# Patient Record
Sex: Male | Born: 1940
Health system: Southern US, Community
[De-identification: ages and names within clinical notes are randomized; demographics above are authoritative.]

## PROBLEM LIST (undated history)

## (undated) DIAGNOSIS — F32A Depression, unspecified: Secondary | ICD-10-CM

## (undated) DIAGNOSIS — F329 Major depressive disorder, single episode, unspecified: Secondary | ICD-10-CM

## (undated) DIAGNOSIS — I714 Abdominal aortic aneurysm, without rupture, unspecified: Secondary | ICD-10-CM

## (undated) DIAGNOSIS — F17200 Nicotine dependence, unspecified, uncomplicated: Secondary | ICD-10-CM

## (undated) DIAGNOSIS — I10 Essential (primary) hypertension: Secondary | ICD-10-CM

## (undated) DIAGNOSIS — C3490 Malignant neoplasm of unspecified part of unspecified bronchus or lung: Secondary | ICD-10-CM

## (undated) DIAGNOSIS — R198 Other specified symptoms and signs involving the digestive system and abdomen: Secondary | ICD-10-CM

## (undated) DIAGNOSIS — J449 Chronic obstructive pulmonary disease, unspecified: Secondary | ICD-10-CM

## (undated) DIAGNOSIS — M1990 Unspecified osteoarthritis, unspecified site: Secondary | ICD-10-CM

## (undated) DIAGNOSIS — K219 Gastro-esophageal reflux disease without esophagitis: Secondary | ICD-10-CM

## (undated) DIAGNOSIS — E079 Disorder of thyroid, unspecified: Secondary | ICD-10-CM

## (undated) DIAGNOSIS — G8929 Other chronic pain: Secondary | ICD-10-CM

## (undated) DIAGNOSIS — M549 Dorsalgia, unspecified: Secondary | ICD-10-CM

## (undated) DIAGNOSIS — Z8719 Personal history of other diseases of the digestive system: Secondary | ICD-10-CM

## (undated) DIAGNOSIS — I739 Peripheral vascular disease, unspecified: Secondary | ICD-10-CM

## (undated) DIAGNOSIS — E039 Hypothyroidism, unspecified: Secondary | ICD-10-CM

## (undated) HISTORY — DX: Abdominal aortic aneurysm, without rupture: I71.4

## (undated) HISTORY — DX: Essential (primary) hypertension: I10

## (undated) HISTORY — DX: Nicotine dependence, unspecified, uncomplicated: F17.200

## (undated) HISTORY — DX: Dorsalgia, unspecified: M54.9

## (undated) HISTORY — PX: APPENDECTOMY: SHX54

## (undated) HISTORY — DX: Malignant neoplasm of unspecified part of unspecified bronchus or lung: C34.90

## (undated) HISTORY — DX: Disorder of thyroid, unspecified: E07.9

## (undated) HISTORY — DX: Chronic obstructive pulmonary disease, unspecified: J44.9

## (undated) HISTORY — DX: Other chronic pain: G89.29

## (undated) HISTORY — DX: Abdominal aortic aneurysm, without rupture, unspecified: I71.40

---

## 1980-03-01 HISTORY — PX: VASECTOMY: SHX75

## 2005-07-30 ENCOUNTER — Ambulatory Visit: Payer: Self-pay | Admitting: Cardiology

## 2009-03-26 ENCOUNTER — Encounter: Admission: RE | Admit: 2009-03-26 | Discharge: 2009-03-26 | Payer: Self-pay | Admitting: Vascular Surgery

## 2009-03-26 ENCOUNTER — Ambulatory Visit: Payer: Self-pay | Admitting: Vascular Surgery

## 2009-03-27 ENCOUNTER — Ambulatory Visit: Payer: Self-pay | Admitting: Cardiovascular Disease

## 2009-03-27 DIAGNOSIS — I714 Abdominal aortic aneurysm, without rupture, unspecified: Secondary | ICD-10-CM | POA: Insufficient documentation

## 2009-03-27 DIAGNOSIS — R0602 Shortness of breath: Secondary | ICD-10-CM | POA: Insufficient documentation

## 2009-03-27 DIAGNOSIS — F17200 Nicotine dependence, unspecified, uncomplicated: Secondary | ICD-10-CM

## 2009-04-08 ENCOUNTER — Ambulatory Visit: Payer: Self-pay | Admitting: Cardiology

## 2009-04-08 ENCOUNTER — Encounter (HOSPITAL_COMMUNITY): Admission: RE | Admit: 2009-04-08 | Discharge: 2009-05-08 | Payer: Self-pay | Admitting: Cardiovascular Disease

## 2009-04-14 ENCOUNTER — Encounter: Payer: Self-pay | Admitting: Cardiovascular Disease

## 2009-04-22 ENCOUNTER — Ambulatory Visit: Payer: Self-pay | Admitting: Vascular Surgery

## 2009-04-22 ENCOUNTER — Inpatient Hospital Stay (HOSPITAL_COMMUNITY): Admission: RE | Admit: 2009-04-22 | Discharge: 2009-04-23 | Payer: Self-pay | Admitting: Vascular Surgery

## 2009-04-22 HISTORY — PX: ABDOMINAL AORTIC ANEURYSM REPAIR: SUR1152

## 2009-05-21 ENCOUNTER — Ambulatory Visit: Payer: Self-pay | Admitting: Vascular Surgery

## 2009-05-21 ENCOUNTER — Encounter: Admission: RE | Admit: 2009-05-21 | Discharge: 2009-05-21 | Payer: Self-pay | Admitting: Vascular Surgery

## 2009-11-20 ENCOUNTER — Ambulatory Visit: Payer: Self-pay | Admitting: Vascular Surgery

## 2009-11-20 ENCOUNTER — Encounter: Admission: RE | Admit: 2009-11-20 | Discharge: 2009-11-20 | Payer: Self-pay | Admitting: Vascular Surgery

## 2010-03-21 ENCOUNTER — Other Ambulatory Visit: Payer: Self-pay | Admitting: Vascular Surgery

## 2010-03-21 DIAGNOSIS — I714 Abdominal aortic aneurysm, without rupture, unspecified: Secondary | ICD-10-CM

## 2010-03-31 NOTE — Letter (Signed)
Summary: [facility]  [address], [place] [address]   Phone: [phone]  Fax: [phone]     April 14, 2009 MRN: [id number]   Terry Boyd [address] [place], [place]  [address]      YOUR LAB WORK IS DUE  April 29, 2009 _________________________________________  Please go to [facility], located across the street from [facility] on the second floor.  Hours are Monday - Friday 7am until 7:30pm         Saturday 8am until 12noon    __  DO NOT EAT OR DRINK AFTER MIDNIGHT EVENING PRIOR TO LABWORK  __ YOUR LABWORK IS NOT FASTING --YOU MAY EAT PRIOR TO LABWORK

## 2010-03-31 NOTE — Assessment & Plan Note (Signed)
Summary: PRO OP CLEARANCE FOR HERNIA REPAIR   Visit Type:  Initial Consult Primary Provider:  Julieanne Manson   History of Present Illness: Terry Boyd is seen today for pre-op clearance.  He needs a AAA repair by Dr Darrick Penna.  He indicates he may opt for stenting rather than an open procedure.  He stopped smoking a ppd 3 days ago.  He has no history of CAD, MI, or CHF.  He denies other CRF's besides the smoking.  He has mlld exertional dyspnea likely from his smoking.  I reviewed Dr Darrick Penna testing and it appears that his carotids are normal.  His ECG in our office today shows mildly poor R wave progression and is read by the computer as ? anteriro infarct age undetermined.  Given his abnormal ECG, smoking and need for vascular surgery we will recommend a stress myovue early next week.    Current Medications (verified): 1)  Lorazepam 1 Mg Tabs (Lorazepam) .... Take As Needed 2)  Levothyroxine Sodium 100 Mcg Tabs (Levothyroxine Sodium) .... Take 1 Tab Daily  Allergies (verified): No Known Drug Allergies  Past History:  Past Medical History: AAA Smoker Appendectomy Chronic Back Pain.    Family History: non-contributory  Social History: Divorced Retired Scientist, product/process development Smoker until 03/24/2009 Non-drinker Sedentary  Review of Systems       Denies fever, malais, weight loss, blurry vision, decreased visual acuity, cough, sputum, , hemoptysis, pleuritic pain, palpitaitons, heartburn, abdominal pain, melena, lower extremity edema, claudication, or rash. All other systems reviewed and negative  Vital Signs:  Patient profile:   70 year old male Height:      70 inches Weight:      179 pounds BMI:     25.78 Pulse rate:   75 / minute BP sitting:   135 / 86  (right arm)  Vitals Entered By: Dreama Saa, CNA (March 27, 2009 11:09 AM)  Physical Exam  General:  Affect appropriate Healthy:  appears stated age HEENT: normal Neck supple with no adenopathy JVP normal no bruits no  thyromegaly Lungs clear with no wheezing and good diaphragmatic motion Heart:  S1/S2 no murmur,rub, gallop or click PMI normal Abdomen: benighn, BS positve, no tenderness, no AAA no bruit.  No HSM or HJR Distal pulses intact with no bruits No edema Neuro non-focal Skin warm and dry Left popliteal prominant on palpation   Impression & Recommendations:  Problem # 1:  UNSPECIFIED PRE-OPERATIVE EXAMINATION (ICD-V72.84) Myovue to clear for vascular surgery given abnormal ECG and smoking Orders: Nuclear Stress Test (Nuc Stress Test)  Problem # 2:  CIGARETTE SMOKER (ICD-305.1) Counseled for less than 10 minutes benefits of cessation long term and especially pre-op.  Continue nicotine patch.  Problem # 3:  DYSPNEA (ICD-786.05) Pre-op CXR per Dr Darrick Penna.  Likely COPD.  No active wheezing  Patient Instructions: 1)  Your physician recommends that you schedule a follow-up appointment in: as needed  2)  Your physician has requested that you have an exercise stress myoview.  For further information please visit [email or web address]  Please follow instruction sheet, as given. 3)  Your physician recommends that you continue on your current medications as directed. Please refer to the Current Medication list given to you today.   EKG Report  Procedure date:  03/27/2009  Findings:      NSR 80 Cannot R/O anterior wall infarct Abnormal ECG

## 2010-03-31 NOTE — Letter (Signed)
Summary: Walker Treadmill (Nuc Med Stress)   [facility]  [address], [place] [address]   Phone: [phone]  Fax: [phone]    Nuclear Medicine 1-Day Stress Test Information Sheet  Re:     ANCIL DEWAN   DOB:     06/28/1940 MRN:     [id number] Weight:  Appointment Date: Register at: Appointment Time: Referring MD:  _X__Exercise Stress  __Adenosine   __Dobutamine  __Lexiscan  __Persantine   __Thallium  Urgency: ____1 (next day)   ____2 (one week)    ____3 (PRN)  Patient will receive Follow Up call with results: Patient needs follow-up appointment:  Instructions regarding medication:  How to prepare for your stress test: 1. DO NOT eat or dring 6 hours prior to your arrival time. This includes no caffeine (coffee, tea, sodas, chocolate) if you were instructed to take your medications, drink water with it. 2. DO NOT use any tobacco products for at leaset 8 hours prior to arrival. 3. DO NOT wear dresses or any clothing that may have metal clasps or buttons. 4. Wear short sleeve shirts, loose clothing, and comfortalbe walking shoes. 5. DO NOT use lotions, oils or powder on your chest before the test. 6. The test will take approximately 3-4 hours from the time you arrive until completion. 7. To register the day of the test, go to the Short Stay entrance at [facility]. 8. If you must cancel your test, call [phone] as soon as you are aware.  After you arrive for test:   When you arrive at [facility], you will go to Short Stay to be registered. They will then send you to Radiology to check in. The Nuclear Medicine Tech will get you and start an IV in your arm or hand. A small amount of a radioactive tracer will then be injected into your IV. This tracer will then have to circulate for 30-45 minutes. During this time you will wait in the waiting room and you will be able to drink something without caffeine. A series of pictures will be taken  of your heart follwoing this waiting period. After the 1st set of pictures you will go to the stress lab to get ready for your stress test. During the stress test, another small amount of a radioactive tracer will be injected through your IV. When the stress test is complete, there is a short rest period while your heart rate and blood pressure will be monitored. When this monitoring period is complete you will have another set of pictrues taken. (The same as the 1st set of pictures). These pictures are taken between 15 minutes and 1 hour after the stress test. The time depends on the type of stress test you had. Your doctor will inform you of your test results within 7 days after test.    The possibilities of certain changes are possible during the test. They include abnormal blood pressure and disorders of the heart. Side effects of persantine or adenosine can include flushing, chest pain, shortness of breath, stomach tightness, headache and light-headedness. These side effects usually do not last long and are self-resolving. Every effort will be made to keep you comfortable and to minimize complications by obtaining a medical history and by close observation during the test. Emergency equipment, medications, and trained personnel are available to deal with any unusual situation which may arise.  Please notify office at least 48 hours in advance if you are unable to keep  this appt.

## 2010-04-23 ENCOUNTER — Ambulatory Visit
Admission: RE | Admit: 2010-04-23 | Discharge: 2010-04-23 | Disposition: A | Discharge status: Home / Self Care | Payer: No Typology Code available for payment source | Source: Ambulatory Visit | Attending: Vascular Surgery | Admitting: Vascular Surgery

## 2010-04-23 ENCOUNTER — Other Ambulatory Visit: Payer: Self-pay

## 2010-04-23 ENCOUNTER — Ambulatory Visit (INDEPENDENT_AMBULATORY_CARE_PROVIDER_SITE_OTHER): Payer: No Typology Code available for payment source | Admitting: Vascular Surgery

## 2010-04-23 DIAGNOSIS — I714 Abdominal aortic aneurysm, without rupture, unspecified: Secondary | ICD-10-CM

## 2010-04-23 MED ORDER — IOHEXOL 350 MG/ML SOLN
100.0000 mL | Freq: Once | INTRAVENOUS | Status: AC | PRN
  Administered 2010-04-23: 100 mL via INTRAVENOUS

## 2010-04-24 NOTE — Assessment & Plan Note (Addendum)
OFFICE VISIT  Terry Boyd, Terry Boyd DOB:  31-Oct-1940                                       04/23/2010 EAVWU#:[id number]  The patient returns for followup today after aneurysm stent graft repair in February of 2011.  He denies any symptoms of abdominal pain or back pain.  CHRONIC MEDICAL PROBLEMS:  Remain diabetes, hypertension, elevated cholesterol, coronary artery disease, congestive heart failure, COPD. These are all currently stable and followed by Dr. Dimas Aguas.  REVIEW OF SYSTEMS:  He has some shortness of breath with exertion.  He denies chest pain.  PHYSICAL EXAM:  Vital signs:  Blood pressure is 138/84 in the left arm, heart rate is 79 and regular, oxygen saturation is 95% on room air. Chest:  Clear to auscultation.  Abdomen:  Soft, nontender, nondistended. No pulsatile mass.  He has 2+ femoral pulses bilaterally.  He had a CT scan of the abdomen and pelvis today which showed no evidence of endo leak.  His aneurysm diameter has shrunk from 5.9 cm preoperatively to 3.8 cm currently.  Overall the stent graft is in good position.  Of note, during his exam today he did have a cough and states that he has had cold type symptoms for about a week.  I gave him a prescription today for Tussionex for his cold symptoms and a 10 day supply was administered.  I told him to follow up with Dr. Dimas Aguas if his symptoms have not improved at the end of that 10 days.  As far as his aneurysm is concerned his stent graft looks good at this point.  He has had good shrinkage of the aneurysm over time.  He will follow up in 1 year's time with an aortic ultrasound to make sure that his stent graft is still in good position and has no evidence of endo leak.    [name]. Joeph Szatkowski, MD Electronically Signed  CEF/MEDQ  D:  04/23/2010  T:  04/24/2010  Job:  [id number]  cc:   Selinda Flavin

## 2010-05-21 LAB — BASIC METABOLIC PANEL
Calcium: 8.1 mg/dL — ABNORMAL LOW (ref 8.4–10.5)
Chloride: 108 mEq/L (ref 96–112)
Creatinine, Ser: 1.17 mg/dL (ref 0.4–1.5)
GFR calc non Af Amer: >60 mL/min (ref >60)
GFR calc non Af Amer: >60 mL/min (ref >60)

## 2010-05-21 LAB — CBC
HCT: 38.7 % — ABNORMAL LOW (ref 39.0–52.0)
HCT: 41.5 % (ref 39.0–52.0)
HCT: 49.3 % (ref 39.0–52.0)
Hemoglobin: 13.8 g/dL (ref 13.0–17.0)
Hemoglobin: 14.6 g/dL (ref 13.0–17.0)
MCHC: 35.3 g/dL (ref 30.0–36.0)
MCV: 94.3 fL (ref 78.0–100.0)
MCV: 94.7 fL (ref 78.0–100.0)
MCV: 95.4 fL (ref 78.0–100.0)
Platelets: 143 10*3/uL — ABNORMAL LOW (ref 150–400)
Platelets: 166 10*3/uL (ref 150–400)
RBC: 4.08 MIL/uL — ABNORMAL LOW (ref 4.22–5.81)
RBC: 4.4 MIL/uL (ref 4.22–5.81)
RDW: 13.4 % (ref 11.5–15.5)
RDW: 13.6 % (ref 11.5–15.5)
WBC: 10.8 10*3/uL — ABNORMAL HIGH (ref 4.0–10.5)
WBC: 7 10*3/uL (ref 4.0–10.5)

## 2010-05-21 LAB — COMPREHENSIVE METABOLIC PANEL
ALT: 20 U/L (ref 0–53)
AST: 18 U/L (ref 0–37)
BUN: 13 mg/dL (ref 6–23)
CO2: 21 mEq/L (ref 19–32)
Chloride: 107 mEq/L (ref 96–112)
Glucose, Bld: 110 mg/dL — ABNORMAL HIGH (ref 70–99)
Sodium: 135 mEq/L (ref 135–145)
Total Protein: 7.3 g/dL (ref 6.0–8.3)

## 2010-05-21 LAB — TYPE AND SCREEN
ABO/RH(D): A POS
Antibody Screen: NEGATIVE

## 2010-05-21 LAB — URINALYSIS, ROUTINE W REFLEX MICROSCOPIC
Bilirubin Urine: NEGATIVE
Hgb urine dipstick: NEGATIVE
Ketones, ur: NEGATIVE mg/dL
Protein, ur: NEGATIVE mg/dL
Urobilinogen, UA: 0.2 mg/dL (ref 0.0–1.0)
pH: 6 (ref 5.0–8.0)

## 2010-05-21 LAB — BLOOD GAS, ARTERIAL
Acid-base deficit: 1.1 mmol/L (ref 0.0–2.0)
FIO2: 0.21 %
TCO2: 24.1 mmol/L (ref 0–100)

## 2010-05-21 LAB — PROTIME-INR: INR: 1.02 (ref 0.00–1.49)

## 2010-05-21 LAB — APTT: aPTT: 31 seconds (ref 24–37)

## 2010-07-14 NOTE — Procedures (Signed)
CAROTID DUPLEX EXAM   INDICATION:  Preop evaluation for abdominal aortic aneurysm repair.   HISTORY:  Diabetes:  No.  Cardiac:  No.  Hypertension:  Yes.  Smoking:  Yes.  Previous Surgery:  No.  CV History:  Currently asymptomatic.  Amaurosis Fugax No, Paresthesias No, Hemiparesis No.                                       RIGHT             LEFT  Brachial systolic pressure:         118               124  Brachial Doppler waveforms:         Normal            Normal  Vertebral direction of flow:        Antegrade         Antegrade  DUPLEX VELOCITIES (cm/sec)  CCA peak systolic                   77                79  ECA peak systolic                   102               107  ICA peak systolic                   50                74  ICA end diastolic                   19                28  PLAQUE MORPHOLOGY:                  Heterogenous      Heterogenous  PLAQUE AMOUNT:                      Minimal           Minimal  PLAQUE LOCATION:                    Distal CCA        Bifurcation/ICA   IMPRESSION:  1. No evidence of stenosis noted in the right internal carotid artery.  2. 1-39% stenosis of the left internal carotid artery.        ___________________________________________  [name] Fields, MD   CH/MEDQ  D:  03/27/2009  T:  03/27/2009  Job:  [id number]

## 2010-07-14 NOTE — Assessment & Plan Note (Signed)
OFFICE VISIT   Terry Boyd, Terry DALE  DOB:  03-15-1940                                       03/26/2009  UEAVW#:[id number]   CHIEF COMPLAINT:  Abdominal aortic aneurysm.   HISTORY OF PRESENT ILLNESS:  The patient is a 70 year old male who 3  weeks ago was found have an abdominal aortic aneurysm on screening  ultrasound.  He presents today for evaluation of his abdominal aortic  aneurysm.  He denies any family history of aneurysm disease.  He denies  any recent abdominal or back pain.   CHRONIC MEDICAL PROBLEMS:  Mild COPD.   He denies any lower extremity claudication symptoms.   PAST MEDICAL HISTORY:  Is as mentioned above.   PAST SURGICAL HISTORY:  Appendectomy.   FAMILY HISTORY:  Unremarkable.   MEDICATIONS:  Lorazepam 1 mg p.r.n., levothyroxine 100 mcg tablet daily.   SOCIAL HISTORY:  He is single, has 2 children.  Currently smokes a pack  of cigarettes per day but is trying to quit.  He has been smoking for 50  years.  He does not consume alcohol regularly.   REVIEW OF SYSTEMS:  Full 12 point review of systems was performed with  the patient.  Please see intake referral form for details regarding  this.   PHYSICAL EXAMINATION:  Blood pressure 135/84, heart rate 87 and regular,  respirations 22,  oxygen saturation 97% in room air.  HEENT:  Unremarkable.  Neck:  Has 2+ carotid pulses without bruit.  Chest:  Clear to auscultation.  Cardiac:  Regular rate and rhythm without  murmur.  Abdomen:  Soft, nontender, nondistended with an easily palpable  pulsatile epigastric mass.  Extremities:  He has no significant edema.  He has 2+ femoral, 3+ popliteal and 2+ posterior tibial pulses  bilaterally.  He has absent left dorsalis pedis pulse.  He has a 2+  right dorsalis pedis pulse.  Musculoskeletal:  Exam shows no significant  major deformities.  Neurologic:  Exam shows symmetric upper extremity  and lower extremity motor strength which is 5/5.  Skin:   Has no ulcers  or rashes.  Lymphatics:  Exam shows no inguinal, axillary or cervical  adenopathy.   A CT scan of the abdomen and pelvis with IV contrast was performed today  at my request.  I reviewed and interpreted the images.  This is  remarkable for the right renal artery being the lowest.  The aortic  diameter is 21 mm at the neck and 23 cm 1 cm from this.  The aneurysm  diameter is 5.9 cm.  There is some calcium in the left external iliac  artery.  The common femoral arteries are 9 mm or greater in diameter.   In addition, since the patient did have a rather full-feeling popliteal  pulses, we obtained a popliteal ultrasound today to rule out aneurysm.  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  The right popliteal artery was less than 1 cm in  diameter.  There were triphasic waveforms in the popliteal artery  bilaterally with no thrombus.   Also, in light of the fact that we will be considering operative repair  of his aneurysm, he had a bilateral carotid duplex exam which showed  less than 40% stenosis of the left internal carotid  artery and no  stenosis of the right internal carotid artery.   In summary, the patient has a 5.9 cm abdominal aortic aneurysm.  I  believe he would benefit from repair for prevention of aneurysm rupture.  I discussed with him today the risks, benefits and possible  complications of aneurysm repair and I believe he would be a reasonable  candidate for endovascular stent graft repair.  In light of this, we  have forwarded his measurements to the [employer] to make sure that a  stent would be suitable for him.  We will schedule his aneurysm repair  in the near future.  I have also scheduled him to be evaluated by  [facility] in [place] to make sure from a cardiac perspective  his risk stratification is reasonable.  As soon as his cardiac workup is  complete, we will schedule him for  abdominal aortic aneurysm repair.  This is tentatively going to be scheduled for February 22 or 24.     [name]. Fields, MD  Electronically Signed   CEF/MEDQ  D:  04/02/2009  T:  04/03/2009  Job:  [id number]   cc:   Selinda Flavin  [facility] [place]

## 2010-07-14 NOTE — Procedures (Signed)
VASCULAR LAB EXAM   INDICATION:  Full popliteal pulses.   HISTORY:  Diabetes:  No.  Cardiac:  No.  Hypertension:  No.   EXAM:  Bilateral popliteal artery duplex.   IMPRESSION:  1. Patent right popliteal artery with a maximum diameter measurement      of less than 1.0 cm.  2. Patent left popliteal artery with aneurysmal dilatation noted at      the proximal-to-mid segment, measuring 1.4 X 1.5 cm.  3. Triphasic Doppler waveforms noted throughout the bilateral      popliteal arteries with minimal plaque formations noted.   ___________________________________________  [name] Fields, MD   CH/MEDQ  D:  03/27/2009  T:  03/27/2009  Job:  [id number]

## 2010-07-14 NOTE — Assessment & Plan Note (Signed)
OFFICE VISIT   LAMONTA, CYPRESS  DOB:  [birth year]                                       11/20/2009  ZOXWR#:[id number]   The patient returns for followup today.  He previously underwent repair  of an abdominal aortic aneurysm with a Gore Excluder stent graft on  February 22nd.  He has had no problems since the last time he was seen  in the office.  He has been traveling extensively and seeing many of his  [place] veteran buddies from the [employer]  Overall he feels well.  He  denies any abdominal or back pain.  He denies any claudication symptoms.   PHYSICAL EXAMINATION:  Today, blood pressure 149/87 in the left arm,  oxygen saturation is 98% on room air, heart rate 73 and regular.  Abdomen:  Soft, nontender, nondistended.  No masses.  He has 2+ femoral  and 2+ dorsalis pedis pulses bilaterally.  I reviewed his CT angiogram  of the abdomen and pelvis which was performed today.  His aneurysm  diameter has shrunk from 5.6 to 4.8 cm in diameter.  The stent graft is  flush at the level of the renal arteries.  The renal arteries fill well.  The stent graft is in good position.  Overall there is no evidence of  endoleak.   The patient continues to do well at this point.  We will see him again  in February 2012 for repeat CT angiogram.  If his aneurysm is stable at  that point, we will probably go to once yearly ultrasound.     [name]. Fields, MD  Electronically Signed   CEF/MEDQ  D:  11/20/2009  T:  11/21/2009  Job:  [id number]   cc:   Selinda Flavin

## 2010-07-14 NOTE — Assessment & Plan Note (Signed)
OFFICE VISIT   TASHON, CAPP  DOB:  18-Jun-1940                                       05/21/2009  [id number]#:[id number]   The patient is a 70 year old male who underwent aortic stent graft on  05/20/2009.  He returns today for further followup.  His operative  repair was uneventful.  He was discharged to home on postoperative day  #1.  Since arriving home, he has had no abdominal or back pain.  His  bowel function is normal.  He really has, at this point, returned to  normal activity.   On physical exam today, blood pressure is 135/88 in the left arm.  Heart  rate is 81 and regular.  Temperature is 97.9.  Abdomen is soft,  nontender, nondistended.  No pulsatile mass.  Extremities:  He has well-  healed groin incisions and 2+ femoral pulses bilaterally.   He had bilateral ABIs performed today which showed triphasic waveforms,  1.0 on the right and 1.10 on the left.   He had a CT scan of the abdomen and pelvis today, which was reviewed,  which shows the aneurysm is well excluded with no obvious evidence of  endoleak.   Overall patient is doing well.  He will return in 6 months' time for  repeat CT angiogram.  He will return sooner if he has any problems.     [name]. Fields, MD  Electronically Signed   CEF/MEDQ  D:  05/22/2009  T:  05/22/2009  Job:  [id number]   cc:   Selinda Flavin

## 2011-02-25 ENCOUNTER — Encounter: Payer: Self-pay | Admitting: Cardiology

## 2011-04-26 ENCOUNTER — Encounter (INDEPENDENT_AMBULATORY_CARE_PROVIDER_SITE_OTHER): Payer: Medicare Other | Admitting: Vascular Surgery

## 2011-04-26 DIAGNOSIS — Z48812 Encounter for surgical aftercare following surgery on the circulatory system: Secondary | ICD-10-CM

## 2011-04-26 DIAGNOSIS — I714 Abdominal aortic aneurysm, without rupture: Secondary | ICD-10-CM

## 2011-04-26 NOTE — Progress Notes (Unsigned)
AAA EVAR duplex performed @ [facility] 04/26/2011

## 2011-04-30 ENCOUNTER — Other Ambulatory Visit: Payer: Self-pay | Admitting: *Deleted

## 2011-04-30 ENCOUNTER — Encounter: Payer: Self-pay | Admitting: Vascular Surgery

## 2011-04-30 DIAGNOSIS — Z48812 Encounter for surgical aftercare following surgery on the circulatory system: Secondary | ICD-10-CM

## 2011-04-30 DIAGNOSIS — I724 Aneurysm of artery of lower extremity: Secondary | ICD-10-CM

## 2011-04-30 DIAGNOSIS — I714 Abdominal aortic aneurysm, without rupture: Secondary | ICD-10-CM

## 2011-04-30 NOTE — Procedures (Unsigned)
VASCULAR LAB EXAM  INDICATION:  Followup AAA endograft placed 04/22/2009  HISTORY: Diabetes:  No Cardiac:  No Hypertension:  No Smoking:  Currently  EXAM:  AAA Sac Size:  3.10 CM AP, 3.31 CM TRV  Previous Sac Size: CT 04/23/2010  3.3 CM AP, 3.8 CM TRV   IMPRESSION: 1. The aorta and endograft appear patent. 2. No significant change in size of the aneurysmal sac surrounding the     endograft. 3. No evidence of endoleak was detected.    ___________________________________________ [name]. Fields, MD  SH/MEDQ  D:  04/26/2011  T:  04/26/2011  Job:  [id number]

## 2011-05-05 ENCOUNTER — Encounter: Payer: Self-pay | Admitting: Vascular Surgery

## 2011-05-06 ENCOUNTER — Encounter (INDEPENDENT_AMBULATORY_CARE_PROVIDER_SITE_OTHER): Payer: Medicare Other | Admitting: *Deleted

## 2011-05-06 DIAGNOSIS — I724 Aneurysm of artery of lower extremity: Secondary | ICD-10-CM

## 2011-05-21 ENCOUNTER — Encounter: Payer: Self-pay | Admitting: Vascular Surgery

## 2011-05-21 ENCOUNTER — Other Ambulatory Visit: Payer: Self-pay | Admitting: *Deleted

## 2011-05-21 DIAGNOSIS — I724 Aneurysm of artery of lower extremity: Secondary | ICD-10-CM

## 2011-05-21 NOTE — Procedures (Unsigned)
VASCULAR LAB EXAM  INDICATION:  Popliteal aneurysm  HISTORY: Diabetes:  No Cardiac:  No Hypertension:  No Smoking:  Current  EXAM:  Left popliteal artery duplex  IMPRESSION:  Biphasic/triphasic Doppler waveforms noted throughout the left popliteal artery with maximum diameter measurement of 1.7 x 1.7 cm noted.  ___________________________________________ [name] Fields, MD  CH/MEDQ  D:  05/07/2011  T:  05/07/2011  Job:  [id number]

## 2011-12-23 ENCOUNTER — Other Ambulatory Visit: Payer: Self-pay

## 2011-12-23 DIAGNOSIS — I724 Aneurysm of artery of lower extremity: Secondary | ICD-10-CM

## 2011-12-23 DIAGNOSIS — I739 Peripheral vascular disease, unspecified: Secondary | ICD-10-CM

## 2012-01-12 ENCOUNTER — Encounter: Payer: Self-pay | Admitting: Vascular Surgery

## 2012-01-13 ENCOUNTER — Ambulatory Visit (INDEPENDENT_AMBULATORY_CARE_PROVIDER_SITE_OTHER): Payer: Medicare Other | Admitting: Vascular Surgery

## 2012-01-13 ENCOUNTER — Encounter: Payer: Self-pay | Admitting: Vascular Surgery

## 2012-01-13 ENCOUNTER — Encounter (INDEPENDENT_AMBULATORY_CARE_PROVIDER_SITE_OTHER): Payer: Medicare Other | Admitting: *Deleted

## 2012-01-13 VITALS — BP 152/82 | HR 67 | Resp 18 | Ht 69.5 in | Wt 184.0 lb

## 2012-01-13 DIAGNOSIS — I739 Peripheral vascular disease, unspecified: Secondary | ICD-10-CM

## 2012-01-13 DIAGNOSIS — Z48812 Encounter for surgical aftercare following surgery on the circulatory system: Secondary | ICD-10-CM

## 2012-01-13 DIAGNOSIS — I724 Aneurysm of artery of lower extremity: Secondary | ICD-10-CM

## 2012-01-13 NOTE — Addendum Note (Signed)
Addended by: Sharee Pimple on: 01/13/2012 02:08 PM   Modules accepted: Orders

## 2012-01-13 NOTE — Progress Notes (Signed)
[facility] [place] HISTORY AND PHYSICAL   History of Present Illness:  Patient is a 71 y.o. year old male who presents for evaluation of bilateral lower extremity pain.  He previously had Gore Excluder aneurysm stent graft repair in 2011. Over the past several months he has complained of burning and numbness tingling in both legs extending from the knee to the foot. Both legs are equal. This is not activity related. It can occur at rest or with activity. He denies rest pain. 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 He was sent for further evaluation by Dr. Dimas Aguas based on an ABI from inside imaging which showed an ABI of 0.6 on the left and 1.0 on the right. Other medical problems include tobacco abuse, chronic back pain, hypertension, thyroid disease, COPD. These are all currently stable. He is currently taking Ativan which he says helps his leg pain. He does have a known history of a popliteal aneurysm that we have been following.  Past Medical History  Diagnosis Date  . AAA (abdominal aortic aneurysm)   . Smoker   . Chronic back pain   . Hypertension   . Thyroid disease   . COPD (chronic obstructive pulmonary disease)     Past Surgical History  Procedure Date  . Appendectomy   . Abdominal aortic aneurysm repair 04-22-2009    Stent graft repair of AAA     Social History History  Substance Use Topics  . Smoking status: Current Some Day Smoker -- 57 years    Types: Cigars  . Smokeless tobacco: Not on file  . Alcohol Use: No    Family History Family History  Problem Relation Age of Onset  . Diabetes Mother   . Heart disease Father   . Diabetes Father   . Heart disease Sister     CABG x 4  . Cancer Brother     throat cancer    Allergies  Allergies  Allergen Reactions  . Morphine And Related     hallucinations     Current Outpatient Prescriptions  Medication Sig Dispense Refill  . levothyroxine (SYNTHROID, LEVOTHROID) 100 MCG tablet  Take 100 mcg by mouth daily.        Marland Kitchen LORazepam (ATIVAN) 1 MG tablet Take 1 mg by mouth as needed.        . sertraline (ZOLOFT) 50 MG tablet Take 50 mg by mouth daily.      Marland Kitchen lisinopril (PRINIVIL,ZESTRIL) 10 MG tablet Take 10 mg by mouth daily.          ROS:   General:  No weight loss, Fever, chills  HEENT: No recent headaches, no nasal bleeding, no visual changes, no sore throat  Neurologic: No dizziness, blackouts, seizures. No recent symptoms of stroke or mini- stroke. No recent episodes of slurred speech, or temporary blindness.  Cardiac: No recent episodes of chest pain/pressure, no shortness of breath at rest.  No shortness of breath with exertion.  Denies history of atrial fibrillation or irregular heartbeat  Vascular: No history of rest pain in feet.  No history of claudication.  No history of non-healing ulcer, No history of DVT   Pulmonary: No home oxygen, no productive cough, no hemoptysis,  No asthma or wheezing  Musculoskeletal:  [ ]  Arthritis, [x ] Low back pain,  [ ]  Joint pain  Hematologic:No history of hypercoagulable state.  No history of easy bleeding.  No history of anemia  Gastrointestinal: No hematochezia or melena,  No gastroesophageal reflux, no trouble swallowing  Urinary: [ ]  chronic Kidney disease, [ ]  on HD - [ ]  MWF or [ ]  TTHS, [ ]  Burning with urination, [ ]  Frequent urination, [ ]  Difficulty urinating;   Skin: No rashes  Psychological: No history of anxiety,  No history of depression   Physical Examination  Filed Vitals:   01/13/12 1318  BP: 152/82  Pulse: 67  Resp: 18  Height: 5' 9.5" (1.765 m)  Weight: 184 lb (83.462 kg)  SpO2: 99%    Body mass index is 26.78 kg/(m^2).  General:  Alert and oriented, no acute distress HEENT: Normal Neck: No bruit or JVD Pulmonary: Clear to auscultation bilaterally Cardiac: Regular Rate and Rhythm without murmur Abdomen: Soft, non-tender, non-distended, no mass, no scars Skin: No rash Extremity  Pulses:  2+ radial, brachial, femoral, dorsalis pedis, posterior tibial pulses bilaterally, popliteal pulses are full bilaterally Musculoskeletal: No deformity or edema  Neurologic: Upper and lower extremity motor 5/5 and symmetric  DATA: He had bilateral ABIs performed in our office today which were actually normal and greater than 1 bilaterally. He also had digit pressures which were 74 on the left 82 on the right which were also within normal limits. Waveforms were triphasic bilaterally which were within normal limits. However, duplex of his left popliteal artery aneurysm shows his aneurysm has grown somewhat. It is now 1.95 x 1.99 cm. There was moderate intramural thrombus. The left popliteal aneurysm was 1.5 cm in diameter in 2011. He had no evidence of a right popliteal aneurysm in 2011.   ASSESSMENT: Bilateral lower extremity pain which sounds more like neuropathy and claudication. The patient had normal ABIs on our vascular lab exam today. He does have a popliteal aneurysm which is at a size we should consider repair of this. I discussed with the patient today the risk of distal embolization from an occluded popliteal aneurysm. He states that he has to make arrangements to have people cover his fall arm and take care of his brother and wishes to defer repair for now. He has followup scheduled for February 2014 for recheck of his aneurysm stent graft. I will see him again at that time and discuss again whether or not to repair his left popliteal aneurysm. I discussed with him the possibility of stent graft repair of this versus open repair. Both of these may be an option however I did discuss that stent graft repair may not be as durable for the popliteal aneurysm.   PLAN:  Followup CT Angio abdomen pelvis to reevaluate his aneurysm stent graft in February 2014. We will also address at that time whether he wishes to have a left popliteal aneurysm repair.  Fabienne Bruns, MD [facility] [place] Office: [phone] Pager: [phone]

## 2012-01-31 ENCOUNTER — Encounter: Payer: Self-pay | Admitting: *Deleted

## 2012-02-02 ENCOUNTER — Ambulatory Visit (INDEPENDENT_AMBULATORY_CARE_PROVIDER_SITE_OTHER): Payer: Medicare Other | Admitting: Cardiovascular Disease

## 2012-02-02 ENCOUNTER — Ambulatory Visit (HOSPITAL_COMMUNITY)
Admission: RE | Admit: 2012-02-02 | Discharge: 2012-02-02 | Disposition: A | Discharge status: Home / Self Care | Payer: Medicare Other | Source: Ambulatory Visit | Attending: Cardiovascular Disease | Admitting: Cardiovascular Disease

## 2012-02-02 ENCOUNTER — Encounter: Payer: Medicare Other | Admitting: Cardiovascular Disease

## 2012-02-02 ENCOUNTER — Encounter: Payer: Self-pay | Admitting: Cardiovascular Disease

## 2012-02-02 VITALS — BP 142/100 | HR 78 | Ht 70.0 in | Wt 185.0 lb

## 2012-02-02 DIAGNOSIS — I079 Rheumatic tricuspid valve disease, unspecified: Secondary | ICD-10-CM | POA: Insufficient documentation

## 2012-02-02 DIAGNOSIS — F17200 Nicotine dependence, unspecified, uncomplicated: Secondary | ICD-10-CM | POA: Insufficient documentation

## 2012-02-02 DIAGNOSIS — R0609 Other forms of dyspnea: Secondary | ICD-10-CM | POA: Insufficient documentation

## 2012-02-02 DIAGNOSIS — Z01818 Encounter for other preprocedural examination: Secondary | ICD-10-CM | POA: Insufficient documentation

## 2012-02-02 DIAGNOSIS — R0989 Other specified symptoms and signs involving the circulatory and respiratory systems: Secondary | ICD-10-CM

## 2012-02-02 DIAGNOSIS — R0602 Shortness of breath: Secondary | ICD-10-CM

## 2012-02-02 DIAGNOSIS — R0600 Dyspnea, unspecified: Secondary | ICD-10-CM

## 2012-02-02 DIAGNOSIS — Z01810 Encounter for preprocedural cardiovascular examination: Secondary | ICD-10-CM

## 2012-02-02 DIAGNOSIS — J4489 Other specified chronic obstructive pulmonary disease: Secondary | ICD-10-CM | POA: Insufficient documentation

## 2012-02-02 DIAGNOSIS — J449 Chronic obstructive pulmonary disease, unspecified: Secondary | ICD-10-CM

## 2012-02-02 DIAGNOSIS — I739 Peripheral vascular disease, unspecified: Secondary | ICD-10-CM | POA: Insufficient documentation

## 2012-02-02 NOTE — Patient Instructions (Addendum)
Your physician recommends that you schedule a follow-up appointment in: As needed  Your physician has requested that you have an echocardiogram. Echocardiography is a painless test that uses sound waves to create images of your heart. It provides your doctor with information about the size and shape of your heart and how well your heart's chambers and valves are working. This procedure takes approximately one hour. There are no restrictions for this procedure.  A chest x-ray takes a picture of the organs and structures inside the chest, including the heart, lungs, and blood vessels. This test can show several things, including, whether the heart is enlarges; whether fluid is building up in the lungs; and whether pacemaker / defibrillator leads are still in place.

## 2012-02-02 NOTE — Progress Notes (Signed)
*  PRELIMINARY RESULTS* Echocardiogram 2D Echocardiogram has been performed.  Conrad Dora 02/02/2012, 1:08 PM

## 2012-02-02 NOTE — Assessment & Plan Note (Signed)
Clear to have surgery .  No complications with AAA surgery and this surgery is non-abdominal.  No chest pain normal ECG and low risk myovue 2011.

## 2012-02-02 NOTE — Assessment & Plan Note (Addendum)
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

## 2012-02-02 NOTE — Progress Notes (Signed)
Patient ID: Terry Boyd, male   DOB: [birth year], 71 y.o.   MRN: [id number] 71 yo referred by Dr Darrick Penna for preop clearance.  Smoker with vascular disease.  Seen 2 years ago for clearance to have AAA repair.  Had stent graft with no complications.  Myovue with soft tissue artifact low risk  EF 45%.  Not motivated to quit smoking 1PPD  Clinical COPD but does not use inhalers regularly Had flu shot.  No chest pain. Mild Exertional dyspnea.  No palpitations edema or syncope.  No previous anesthesia issues or bleeding diathesis.  ECG is normal.  Recent ABI's .8/1 with more neuropathy than claudication.  Left popliteal aneurysm has been followed for a while. Has not had CXR in over a year  ROS: Denies fever, malais, weight loss, blurry vision, decreased visual acuity, cough, sputum, SOB, hemoptysis, pleuritic pain, palpitaitons, heartburn, abdominal pain, melena, lower extremity edema, claudication, or rash.  All other systems reviewed and negative   General: Affect appropriate Chronically ill male HEENT: normal Neck supple with no adenopathy JVP normal no bruits no thyromegaly Lungs clear with end expitory  wheezing and good diaphragmatic motion Heart:  S1/S2 no murmur,rub, gallop or click PMI normal Abdomen: benighn, BS positve, no tenderness, no AAA no bruit.  No HSM or HJR Distal pulses intact with pulsatile aneurysm left popliteal No edema Neuro non-focal Skin warm and dry No muscular weakness  Medications Current Outpatient Prescriptions  Medication Sig Dispense Refill  . levothyroxine (SYNTHROID, LEVOTHROID) 100 MCG tablet Take 100 mcg by mouth daily.        Marland Kitchen LORazepam (ATIVAN) 1 MG tablet Take 1 mg by mouth as needed.        . sertraline (ZOLOFT) 50 MG tablet Take 50 mg by mouth daily.      Marland Kitchen lisinopril (PRINIVIL,ZESTRIL) 10 MG tablet Take 10 mg by mouth daily.          Allergies Morphine and related  Family History: Family History  Problem Relation Age of Onset  .  Diabetes Mother   . Heart disease Father   . Diabetes Father   . Heart disease Sister     CABG x 4  . Cancer Brother     throat cancer    Social History: History   Social History  . Marital Status: Divorced    Spouse Name: N/A    Number of Children: N/A  . Years of Education: N/A   Occupational History  . Retired Scientist, product/process development    Social History Main Topics  . Smoking status: Current Some Day Smoker -- 57 years    Types: Cigars  . Smokeless tobacco: Not on file  . Alcohol Use: No  . Drug Use: No  . Sexually Active: Not on file   Other Topics Concern  . Not on file   Social History Narrative   Sedentary    Electrocardiogram:  NSR rate 75  normal  Assessment and Plan

## 2012-02-03 NOTE — Progress Notes (Signed)
Patient ID: Terry Boyd, male   DOB: 20-Sep-1940, 71 y.o.   MRN: [id number]

## 2012-02-04 ENCOUNTER — Telehealth: Payer: Self-pay | Admitting: *Deleted

## 2012-02-04 DIAGNOSIS — Z01818 Encounter for other preprocedural examination: Secondary | ICD-10-CM

## 2012-02-04 NOTE — Telephone Encounter (Signed)
Spoke to pt to advise results/instructions. Pt understood for echo Advised he will need a lexiscan myoview in order to be cleared for surgery and that someone will call him with that apt date/time Placed order in chart for testing to be scheduled, alerted scheduler TS verbally to have this pt scheduled for testing

## 2012-02-11 ENCOUNTER — Encounter (HOSPITAL_COMMUNITY): Payer: Self-pay

## 2012-02-11 ENCOUNTER — Encounter (HOSPITAL_COMMUNITY)
Admission: RE | Admit: 2012-02-11 | Discharge: 2012-02-11 | Disposition: A | Discharge status: Home / Self Care | Payer: Medicare Other | Source: Ambulatory Visit | Attending: Cardiovascular Disease | Admitting: Cardiovascular Disease

## 2012-02-11 ENCOUNTER — Ambulatory Visit (HOSPITAL_COMMUNITY)
Admission: RE | Admit: 2012-02-11 | Discharge: 2012-02-11 | Disposition: A | Discharge status: Home / Self Care | Payer: Medicare Other | Source: Ambulatory Visit | Attending: Cardiovascular Disease | Admitting: Cardiovascular Disease

## 2012-02-11 ENCOUNTER — Encounter (HOSPITAL_COMMUNITY): Payer: Self-pay | Admitting: Cardiology

## 2012-02-11 DIAGNOSIS — I739 Peripheral vascular disease, unspecified: Secondary | ICD-10-CM

## 2012-02-11 DIAGNOSIS — I724 Aneurysm of artery of lower extremity: Secondary | ICD-10-CM | POA: Insufficient documentation

## 2012-02-11 DIAGNOSIS — Z01810 Encounter for preprocedural cardiovascular examination: Secondary | ICD-10-CM | POA: Insufficient documentation

## 2012-02-11 DIAGNOSIS — R9439 Abnormal result of other cardiovascular function study: Secondary | ICD-10-CM | POA: Insufficient documentation

## 2012-02-11 DIAGNOSIS — R0602 Shortness of breath: Secondary | ICD-10-CM | POA: Insufficient documentation

## 2012-02-11 DIAGNOSIS — Z01818 Encounter for other preprocedural examination: Secondary | ICD-10-CM

## 2012-02-11 DIAGNOSIS — F17200 Nicotine dependence, unspecified, uncomplicated: Secondary | ICD-10-CM | POA: Insufficient documentation

## 2012-02-11 DIAGNOSIS — I714 Abdominal aortic aneurysm, without rupture, unspecified: Secondary | ICD-10-CM | POA: Insufficient documentation

## 2012-02-11 MED ORDER — REGADENOSON 0.4 MG/5ML IV SOLN
INTRAVENOUS | Status: AC
  Administered 2012-02-11: 0.4 mg via INTRAVENOUS
  Filled 2012-02-11: qty 5

## 2012-02-11 MED ORDER — TECHNETIUM TC 99M SESTAMIBI - CARDIOLITE
30.0000 | Freq: Once | INTRAVENOUS | Status: AC | PRN
  Administered 2012-02-11: 29 via INTRAVENOUS

## 2012-02-11 MED ORDER — SODIUM CHLORIDE 0.9 % IJ SOLN
INTRAMUSCULAR | Status: AC
  Administered 2012-02-11: 10 mL via INTRAVENOUS
  Filled 2012-02-11: qty 10

## 2012-02-11 MED ORDER — TECHNETIUM TC 99M SESTAMIBI - CARDIOLITE
10.0000 | Freq: Once | INTRAVENOUS | Status: AC | PRN
  Administered 2012-02-11: 10 via INTRAVENOUS

## 2012-02-11 NOTE — Progress Notes (Signed)
Stress Lab Nurses Notes - [facility]  JEREN DUFRANE 02/11/2012 Reason for doing test: Dyspnea and Surgical Clearance Type of test: Marlane Hatcher Nurse performing test: Parke Poisson, RN Nuclear Medicine Tech: Lou Cal Echo Tech: Not Applicable MD performing test: R. Rothbart & Joni Reining NP Family MD: Dimas Aguas Test explained and consent signed: yes IV started: 22g jelco, Saline lock flushed, No redness or edema and Saline lock started in radiology Symptoms: Couphing Treatment/Intervention: None Reason test stopped: protocol completed After recovery IV was: Discontinued via X-ray tech and No redness or edema Patient to return to Nuc. Med at : 11:45 Patient discharged: Home Patient's Condition upon discharge was: stable Comments: During test BP 100/79 & HR 97. Recovery BP 106/73 & HR  75.  Symptoms resolved in recovery.    Erskine Speed T

## 2012-02-15 ENCOUNTER — Telehealth: Payer: Self-pay | Admitting: Vascular Surgery

## 2012-02-15 NOTE — Telephone Encounter (Addendum)
Message copied by Shari Prows on Tue Feb 15, 2012  2:45 PM ------      Message from: Sherren Kerns      Created: Tue Feb 15, 2012  2:24 PM      Regarding: RE: appt scheduling       Move the visit up that was before he changed his mind on an operation.  If this Thursday is jam packed he can be seen first office I have in January.  Please look to see if I am on call Jan 2 if so the office will need to wrap up early like we do for Dr Imogene Burn when he is on call on a weekend            Charles.      ----- Message -----         From: Shari Prows         Sent: 02/15/2012   1:29 PM           To: Melene Plan, RN, Sherren Kerns, MD      Subject: appt scheduling                                          In regards to the message below, this patient has an appt to see CEF on 04/27/12 with a cta prior. Should I move this appt to early January? Or leave it as is. Let me know and I will contact the pt.       Thanks, Annette                    PLEASE SET UP OFFICE VISIT BECAUSE HE WAS SEEN LAST 01/13/12.         THANKS         JUDY         ----- Message -----            From: Sherren Kerns, MD            Sent: 02/15/2012   9:14 AM              To: Melene Plan, RN                   Ok to schedule for popliteal aneurysm repair.  He needs to see me in the office if it has been more than 1 month             I rescheduled the above pt to 03/02/12 to see CEF at 8:45am and to have his cta scan at 7:45am. I spoke to the pt and also mailed him instructions for these appts and to have bloodwork prior. He voiced understanding. awt

## 2012-02-29 ENCOUNTER — Encounter: Payer: Self-pay | Admitting: Vascular Surgery

## 2012-03-02 ENCOUNTER — Ambulatory Visit
Admission: RE | Admit: 2012-03-02 | Discharge: 2012-03-02 | Disposition: A | Discharge status: Home / Self Care | Payer: Medicare Other | Source: Ambulatory Visit | Attending: Vascular Surgery | Admitting: Vascular Surgery

## 2012-03-02 ENCOUNTER — Ambulatory Visit (INDEPENDENT_AMBULATORY_CARE_PROVIDER_SITE_OTHER): Payer: Medicare Other | Admitting: Vascular Surgery

## 2012-03-02 ENCOUNTER — Encounter: Payer: Self-pay | Admitting: Vascular Surgery

## 2012-03-02 VITALS — BP 151/73 | HR 91 | Ht 69.5 in | Wt 188.9 lb

## 2012-03-02 DIAGNOSIS — I724 Aneurysm of artery of lower extremity: Secondary | ICD-10-CM

## 2012-03-02 DIAGNOSIS — Z48812 Encounter for surgical aftercare following surgery on the circulatory system: Secondary | ICD-10-CM

## 2012-03-02 DIAGNOSIS — I739 Peripheral vascular disease, unspecified: Secondary | ICD-10-CM

## 2012-03-02 MED ORDER — IOHEXOL 350 MG/ML SOLN
150.0000 mL | Freq: Once | INTRAVENOUS | Status: AC | PRN
  Administered 2012-03-02: 150 mL via INTRAVENOUS

## 2012-03-02 NOTE — Progress Notes (Signed)
[facility] [place]  HISTORY AND PHYSICAL  History of Present Illness: Patient is a 71 y.o. year old male who presents for consideration of repair of a left popliteal aneurysm. 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 He does have a history of bilateral lower extremity pain. He previously had Gore Excluder aneurysm stent graft repair in 2011. Over the past several months he has complained of burning and numbness tingling in both legs extending from the knee to the foot. Both legs are equal. This is not activity related. It can occur at rest or with activity. He denies rest pain. 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 He was sent for further evaluation by Dr. Howard based on an ABI from inside imaging which showed an ABI of 0.6 on the left and 1.0 on the right. Other medical problems include tobacco abuse, chronic back pain, hypertension, thyroid disease, COPD. These are all currently stable. He is currently taking Ativan which he says helps his leg pain. He does have a known history of a popliteal aneurysm that we have been following.  Past Medical History   Diagnosis  Date   .  AAA (abdominal aortic aneurysm)    .  Smoker    .  Chronic back pain    .  Hypertension    .  Thyroid disease    .  COPD (chronic obstructive pulmonary disease)     Past Surgical History   Procedure  Date   .  Appendectomy    .  Abdominal aortic aneurysm repair  04-22-2009     Stent graft repair of AAA   Social History  History   Substance Use Topics   .  Smoking status:  Current Some Day Smoker -- 57 years     Types:  Cigars   .  Smokeless tobacco:  Not on file   .  Alcohol Use:  No   Family History  Family History   Problem  Relation  Age of Onset   .  Diabetes  Mother    .  Heart disease  Father    .  Diabetes  Father    .  Heart disease  Sister       CABG x 4    .  Cancer  Brother       throat cancer   Allergies    Allergies   Allergen  Reactions   .  Morphine And Related      hallucinations    Current Outpatient Prescriptions   Medication  Sig  Dispense  Refill   .  levothyroxine (SYNTHROID, LEVOTHROID) 100 MCG tablet  Take 100 mcg by mouth daily.     .  LORazepam (ATIVAN) 1 MG tablet  Take 1 mg by mouth as needed.     .  sertraline (ZOLOFT) 50 MG tablet  Take 50 mg by mouth daily.     .  lisinopril (PRINIVIL,ZESTRIL) 10 MG tablet  Take 10 mg by mouth daily.     ROS:  General: No weight loss, Fever, chills  HEENT: No recent headaches, no nasal bleeding, no visual changes, no sore throat  Neurologic: No dizziness, blackouts, seizures. No recent symptoms of stroke or mini- stroke. No recent episodes of slurred speech, or temporary blindness.  Cardiac: No recent episodes of chest pain/pressure, no shortness of breath at rest. No shortness of breath with exertion. Denies   history of atrial fibrillation or irregular heartbeat  Vascular: No history of rest pain in feet. No history of claudication. No history of non-healing ulcer, No history of DVT  Pulmonary: No home oxygen, no productive cough, no hemoptysis, No asthma or wheezing  Musculoskeletal: [ ] Arthritis, [x ] Low back pain, [ ] Joint pain  Hematologic:No history of hypercoagulable state. No history of easy bleeding. No history of anemia  Gastrointestinal: No hematochezia or melena, No gastroesophageal reflux, no trouble swallowing  Urinary: [ ] chronic Kidney disease, [ ] on HD - [ ] MWF or [ ] TTHS, [ ] Burning with urination, [ ] Frequent urination, [ ] Difficulty urinating;  Skin: No rashes  Psychological: No history of anxiety, No history of depression  Physical Examination  Filed Vitals:   03/02/12 0920  BP: 151/73  Pulse: 91  Height: 5' 9.5" (1.765 m)  Weight: 188 lb 14.4 oz (85.684 kg)  SpO2: 96%   General: Alert and oriented, no acute distress  HEENT: Normal  Neck: No bruit or JVD  Pulmonary: Clear to auscultation bilaterally   Cardiac: Regular Rate and Rhythm without murmur  Abdomen: Soft, non-tender, non-distended, no mass, no scars  Skin: No rash  Extremity Pulses: 2+ radial, brachial, femoral, absent dorsalis pedis, posterior tibial pulses bilaterally, popliteal pulses are full bilaterally  Musculoskeletal: No deformity or edema  Neurologic: Upper and lower extremity motor 5/5 and symmetric   DATA: He had bilateral ABIs performed recently which were actually normal and greater than 1 bilaterally. He also had digit pressures which were 74 on the left 82 on the right which were also within normal limits. Waveforms were triphasic bilaterally which were within normal limits. However, duplex of his left popliteal artery aneurysm shows his aneurysm has grown somewhat. It is now 1.95 x 1.99 cm. There was moderate intramural thrombus. The left popliteal aneurysm was 1.5 cm in diameter in 2011. He had no evidence of a right popliteal aneurysm in 2011.   ASSESSMENT: Bilateral lower extremity pain which sounds more like neuropathy and claudication. The patient had normal ABIs. He does have a popliteal aneurysm which is at a size we should consider repair of this. I discussed with the patient today the risk of distal embolization from an occluded popliteal aneurysm. At this point he wishes repair of a left popliteal aneurysm.  Recent cardiac risk stratification by Dr. Rothbart and Nishan shows reasonable operative risk  PLAN:  Bilateral lower extremity arteriogram January 17. Repair of left popliteal aneurysm January 20. Risks benefits possible complications procedure details of the arteriogram in the popliteal aneurysm repair were discussed with the patient today. He understands and agrees to proceed.  Charles Fields, MD  [facility]  Office: [phone]  Pager: [phone]  

## 2012-03-03 ENCOUNTER — Encounter: Payer: Self-pay | Admitting: *Deleted

## 2012-03-03 ENCOUNTER — Other Ambulatory Visit: Payer: Self-pay | Admitting: *Deleted

## 2012-03-06 ENCOUNTER — Encounter (HOSPITAL_COMMUNITY): Payer: Self-pay | Admitting: Pharmacy Technician

## 2012-03-10 ENCOUNTER — Other Ambulatory Visit: Payer: Self-pay

## 2012-03-13 MED ORDER — DEXTROSE 5 % IV SOLN
1.5000 g | INTRAVENOUS | Status: DC
  Filled 2012-03-13: qty 1.5

## 2012-03-14 ENCOUNTER — Encounter (HOSPITAL_COMMUNITY)
Admission: RE | Disposition: A | Discharge status: Home / Self Care | Payer: Self-pay | Source: Ambulatory Visit | Attending: Vascular Surgery

## 2012-03-14 ENCOUNTER — Ambulatory Visit (HOSPITAL_COMMUNITY)
Admission: RE | Admit: 2012-03-14 | Discharge: 2012-03-14 | Disposition: A | Discharge status: Home / Self Care | Payer: Medicare Other | Source: Ambulatory Visit | Attending: Vascular Surgery | Admitting: Vascular Surgery

## 2012-03-14 DIAGNOSIS — M549 Dorsalgia, unspecified: Secondary | ICD-10-CM | POA: Insufficient documentation

## 2012-03-14 DIAGNOSIS — I724 Aneurysm of artery of lower extremity: Secondary | ICD-10-CM | POA: Insufficient documentation

## 2012-03-14 DIAGNOSIS — G8929 Other chronic pain: Secondary | ICD-10-CM | POA: Insufficient documentation

## 2012-03-14 DIAGNOSIS — J449 Chronic obstructive pulmonary disease, unspecified: Secondary | ICD-10-CM | POA: Insufficient documentation

## 2012-03-14 DIAGNOSIS — J4489 Other specified chronic obstructive pulmonary disease: Secondary | ICD-10-CM | POA: Insufficient documentation

## 2012-03-14 DIAGNOSIS — I10 Essential (primary) hypertension: Secondary | ICD-10-CM | POA: Insufficient documentation

## 2012-03-14 DIAGNOSIS — F17200 Nicotine dependence, unspecified, uncomplicated: Secondary | ICD-10-CM | POA: Insufficient documentation

## 2012-03-14 HISTORY — PX: ABDOMINAL AORTAGRAM: SHX5454

## 2012-03-14 LAB — POCT I-STAT, CHEM 8
BUN: 19 mg/dL (ref 6–23)
Calcium, Ion: 1.25 mmol/L (ref 1.13–1.30)
HCT: 52 % (ref 39.0–52.0)
Hemoglobin: 17.7 g/dL — ABNORMAL HIGH (ref 13.0–17.0)
TCO2: 24 mmol/L (ref 0–100)

## 2012-03-14 SURGERY — ABDOMINAL AORTAGRAM
Anesthesia: LOCAL

## 2012-03-14 MED ORDER — LIDOCAINE HCL (PF) 1 % IJ SOLN
INTRAMUSCULAR | Status: AC
  Filled 2012-03-14: qty 30

## 2012-03-14 MED ORDER — ONDANSETRON HCL 4 MG/2ML IJ SOLN: 4.0000 mg | Freq: Four times a day (QID) | INTRAMUSCULAR | Status: DC | PRN

## 2012-03-14 MED ORDER — METOPROLOL TARTRATE 1 MG/ML IV SOLN: 2.0000 mg | INTRAVENOUS | Status: DC | PRN

## 2012-03-14 MED ORDER — ALUM & MAG HYDROXIDE-SIMETH 200-200-20 MG/5ML PO SUSP: 15.0000 mL | ORAL | Status: DC | PRN

## 2012-03-14 MED ORDER — HYDRALAZINE HCL 20 MG/ML IJ SOLN: 10.0000 mg | INTRAMUSCULAR | Status: DC | PRN

## 2012-03-14 MED ORDER — LABETALOL HCL 5 MG/ML IV SOLN: 10.0000 mg | INTRAVENOUS | Status: DC | PRN

## 2012-03-14 MED ORDER — ACETAMINOPHEN 325 MG RE SUPP: 325.0000 mg | RECTAL | Status: DC | PRN

## 2012-03-14 MED ORDER — ACETAMINOPHEN 325 MG PO TABS: 325.0000 mg | ORAL_TABLET | ORAL | Status: DC | PRN

## 2012-03-14 MED ORDER — GUAIFENESIN-DM 100-10 MG/5ML PO SYRP: 15.0000 mL | ORAL_SOLUTION | ORAL | Status: DC | PRN

## 2012-03-14 MED ORDER — SODIUM CHLORIDE 0.9 % IV SOLN: 1.0000 mL/kg/h | INTRAVENOUS | Status: DC

## 2012-03-14 MED ORDER — PHENOL 1.4 % MT LIQD: 1.0000 | OROMUCOSAL | Status: DC | PRN

## 2012-03-14 MED ORDER — SODIUM CHLORIDE 0.9 % IV SOLN
INTRAVENOUS | Status: DC
  Administered 2012-03-14: 06:00:00 via INTRAVENOUS

## 2012-03-14 MED ORDER — HEPARIN (PORCINE) IN NACL 2-0.9 UNIT/ML-% IJ SOLN
INTRAMUSCULAR | Status: AC
  Filled 2012-03-14: qty 1000

## 2012-03-14 NOTE — Progress Notes (Signed)
CLIENT UP AND WALKED AND TOL WELL AND RIGHT GROIN STABLE; NO BLEEDING OR HEMATOMA

## 2012-03-14 NOTE — Op Note (Signed)
[facility]  Patient name: Terry Boyd MRN: [id number] DOB: 21-Sep-1940 Sex: male  03/14/2012 Pre-operative Diagnosis: Left popliteal aneurysm Post-operative diagnosis:  Same Surgeon:  Jorge Ny Procedure Performed:  1.  ultrasound access right femoral artery  2.  abdominal aortogram  3.  bilateral lower extremity runoff  4.  first-order catheterization    Indications:  The patient has a history of endovascular aneurysm repair. He was recently found to have a popliteal aneurysm and lower extremity claudication. He is scheduled for popliteal aneurysm repair end comes in today for angiographic imaging  Procedure:  The patient was identified in the holding area and taken to room 8.  The patient was then placed supine on the table and prepped and draped in the usual sterile fashion.  A time out was called.  Ultrasound was used to evaluate the right common femoral artery.  It was patent .  A digital ultrasound image was acquired.  A micropuncture needle was used to access the right common femoral artery under ultrasound guidance.  An 018 wire was advanced without resistance and a micropuncture sheath was placed.  The 018 wire was removed and a benson wire was placed.  The micropuncture sheath was exchanged for a 5 french sheath.  An omniflush catheter was advanced over the wire to the level of L-1.  An abdominal angiogram was obtained.  Next, using the omniflush catheter and a benson wire, the aortic bifurcation was crossed and the catheter was placed into theleft common iliac artery and left runoff was obtained.  right runoff was performed via retrograde sheath injections.  Findings:   Aortogram:  The visualized portions of the suprarenal abdominal aorta showed no significant disease. Single renal arteries are to 5 bilaterally which are widely patent. There is a infrarenal abdominal aortic stent graft present which is widely patent without evidence of  endoleak. Bilateral common and external iliac arteries are widely patent  Right Lower Extremity:  The right common femoral artery is widely patent. The right profunda femoral artery and superficial femoral artery are widely patent. The popliteal artery is widely patent. There is two-vessel runoff via the peroneal artery and the posterior tibial artery which is the dominant runoff across the ankle.  Left Lower Extremity:  The left common femoral artery is widely patent. Left profunda femoral and superficial femoral artery are widely patent. By angiographic intraluminal opacification, there is ectasia of the popliteal artery which extends across the knee joint. There is two-vessel runoff via the peroneal and posterior tibial artery. The dominant runoff to the posterior tibial crossed the ankle  Intervention:  None  Impression:  #1  ectasia/aneurysmal changes of the left popliteal artery with two-vessel runoff via the peroneal and posterior tibial artery.  #2  two-vessel runoff on the right leg via the posterior tibial and peroneal artery  #3  widely patent abdominal aortic stent graft without complicating features    V. Durene Cal, M.D. [facility] [place] Office: [phone] Pager:  [phone]

## 2012-03-14 NOTE — Interval H&P Note (Signed)
History and Physical Interval Note:  03/14/2012 7:45 AM  Terry Boyd  has presented today for surgery, with the diagnosis of PVD  The various methods of treatment have been discussed with the patient and family. After consideration of risks, benefits and other options for treatment, the patient has consented to  Procedure(s) (LRB) with comments: ABDOMINAL AORTAGRAM (N/A) as a surgical intervention .  The patient's history has been reviewed, patient examined, no change in status, stable for surgery.  I have reviewed the patient's chart and labs.  Questions were answered to the patient's satisfaction.     BRABHAM IV, V. WELLS

## 2012-03-14 NOTE — H&P (View-Only) (Signed)
[facility] [place]  HISTORY AND PHYSICAL  History of Present Illness: Patient is a 71 y.o. year old male who presents for consideration of repair of a left popliteal aneurysm. 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 He does have a history of bilateral lower extremity pain. He previously had Gore Excluder aneurysm stent graft repair in 2011. Over the past several months he has complained of burning and numbness tingling in both legs extending from the knee to the foot. Both legs are equal. This is not activity related. It can occur at rest or with activity. He denies rest pain. 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 He was sent for further evaluation by Dr. Howard based on an ABI from inside imaging which showed an ABI of 0.6 on the left and 1.0 on the right. Other medical problems include tobacco abuse, chronic back pain, hypertension, thyroid disease, COPD. These are all currently stable. He is currently taking Ativan which he says helps his leg pain. He does have a known history of a popliteal aneurysm that we have been following.  Past Medical History   Diagnosis  Date   .  AAA (abdominal aortic aneurysm)    .  Smoker    .  Chronic back pain    .  Hypertension    .  Thyroid disease    .  COPD (chronic obstructive pulmonary disease)     Past Surgical History   Procedure  Date   .  Appendectomy    .  Abdominal aortic aneurysm repair  04-22-2009     Stent graft repair of AAA   Social History  History   Substance Use Topics   .  Smoking status:  Current Some Day Smoker -- 57 years     Types:  Cigars   .  Smokeless tobacco:  Not on file   .  Alcohol Use:  No   Family History  Family History   Problem  Relation  Age of Onset   .  Diabetes  Mother    .  Heart disease  Father    .  Diabetes  Father    .  Heart disease  Sister       CABG x 4    .  Cancer  Brother       throat cancer   Allergies    Allergies   Allergen  Reactions   .  Morphine And Related      hallucinations    Current Outpatient Prescriptions   Medication  Sig  Dispense  Refill   .  levothyroxine (SYNTHROID, LEVOTHROID) 100 MCG tablet  Take 100 mcg by mouth daily.     .  LORazepam (ATIVAN) 1 MG tablet  Take 1 mg by mouth as needed.     .  sertraline (ZOLOFT) 50 MG tablet  Take 50 mg by mouth daily.     .  lisinopril (PRINIVIL,ZESTRIL) 10 MG tablet  Take 10 mg by mouth daily.     ROS:  General: No weight loss, Fever, chills  HEENT: No recent headaches, no nasal bleeding, no visual changes, no sore throat  Neurologic: No dizziness, blackouts, seizures. No recent symptoms of stroke or mini- stroke. No recent episodes of slurred speech, or temporary blindness.  Cardiac: No recent episodes of chest pain/pressure, no shortness of breath at rest. No shortness of breath with exertion. Denies   history of atrial fibrillation or irregular heartbeat  Vascular: No history of rest pain in feet. No history of claudication. No history of non-healing ulcer, No history of DVT  Pulmonary: No home oxygen, no productive cough, no hemoptysis, No asthma or wheezing  Musculoskeletal: [ ] Arthritis, [x ] Low back pain, [ ] Joint pain  Hematologic:No history of hypercoagulable state. No history of easy bleeding. No history of anemia  Gastrointestinal: No hematochezia or melena, No gastroesophageal reflux, no trouble swallowing  Urinary: [ ] chronic Kidney disease, [ ] on HD - [ ] MWF or [ ] TTHS, [ ] Burning with urination, [ ] Frequent urination, [ ] Difficulty urinating;  Skin: No rashes  Psychological: No history of anxiety, No history of depression  Physical Examination  Filed Vitals:   03/02/12 0920  BP: 151/73  Pulse: 91  Height: 5' 9.5" (1.765 m)  Weight: 188 lb 14.4 oz (85.684 kg)  SpO2: 96%   General: Alert and oriented, no acute distress  HEENT: Normal  Neck: No bruit or JVD  Pulmonary: Clear to auscultation bilaterally   Cardiac: Regular Rate and Rhythm without murmur  Abdomen: Soft, non-tender, non-distended, no mass, no scars  Skin: No rash  Extremity Pulses: 2+ radial, brachial, femoral, absent dorsalis pedis, posterior tibial pulses bilaterally, popliteal pulses are full bilaterally  Musculoskeletal: No deformity or edema  Neurologic: Upper and lower extremity motor 5/5 and symmetric   DATA: He had bilateral ABIs performed recently which were actually normal and greater than 1 bilaterally. He also had digit pressures which were 74 on the left 82 on the right which were also within normal limits. Waveforms were triphasic bilaterally which were within normal limits. However, duplex of his left popliteal artery aneurysm shows his aneurysm has grown somewhat. It is now 1.95 x 1.99 cm. There was moderate intramural thrombus. The left popliteal aneurysm was 1.5 cm in diameter in 2011. He had no evidence of a right popliteal aneurysm in 2011.   ASSESSMENT: Bilateral lower extremity pain which sounds more like neuropathy and claudication. The patient had normal ABIs. He does have a popliteal aneurysm which is at a size we should consider repair of this. I discussed with the patient today the risk of distal embolization from an occluded popliteal aneurysm. At this point he wishes repair of a left popliteal aneurysm.  Recent cardiac risk stratification by Dr. Rothbart and Nishan shows reasonable operative risk  PLAN:  Bilateral lower extremity arteriogram January 17. Repair of left popliteal aneurysm January 20. Risks benefits possible complications procedure details of the arteriogram in the popliteal aneurysm repair were discussed with the patient today. He understands and agrees to proceed.  Marven Veley, MD  [facility] [place]  Office: [phone]  Pager: [phone]  

## 2012-03-17 ENCOUNTER — Encounter (HOSPITAL_COMMUNITY): Payer: Self-pay

## 2012-03-17 ENCOUNTER — Encounter (HOSPITAL_COMMUNITY)
Admission: RE | Admit: 2012-03-17 | Discharge: 2012-03-17 | Disposition: A | Discharge status: Home / Self Care | Payer: Medicare Other | Source: Ambulatory Visit | Attending: Vascular Surgery | Admitting: Vascular Surgery

## 2012-03-17 HISTORY — DX: Depression, unspecified: F32.A

## 2012-03-17 HISTORY — DX: Major depressive disorder, single episode, unspecified: F32.9

## 2012-03-17 HISTORY — DX: Hypothyroidism, unspecified: E03.9

## 2012-03-17 HISTORY — DX: Unspecified osteoarthritis, unspecified site: M19.90

## 2012-03-17 LAB — APTT: aPTT: 35 seconds (ref 24–37)

## 2012-03-17 LAB — COMPREHENSIVE METABOLIC PANEL
Albumin: 4.1 g/dL (ref 3.5–5.2)
Alkaline Phosphatase: 77 U/L (ref 39–117)
BUN: 13 mg/dL (ref 6–23)
Calcium: 9.9 mg/dL (ref 8.4–10.5)
Creatinine, Ser: 1.15 mg/dL (ref 0.50–1.35)
GFR calc Af Amer: 73 mL/min — ABNORMAL LOW (ref >90)
Glucose, Bld: 83 mg/dL (ref 70–99)
Total Protein: 7.9 g/dL (ref 6.0–8.3)

## 2012-03-17 LAB — TYPE AND SCREEN
ABO/RH(D): A POS
Antibody Screen: NEGATIVE

## 2012-03-17 LAB — SURGICAL PCR SCREEN
MRSA, PCR: NEGATIVE
Staphylococcus aureus: NEGATIVE

## 2012-03-17 LAB — URINALYSIS, ROUTINE W REFLEX MICROSCOPIC
Glucose, UA: NEGATIVE mg/dL
Ketones, ur: NEGATIVE mg/dL
Protein, ur: NEGATIVE mg/dL
Urobilinogen, UA: 0.2 mg/dL (ref 0.0–1.0)

## 2012-03-17 LAB — CBC
HCT: 51.6 % (ref 39.0–52.0)
Hemoglobin: 18.6 g/dL — ABNORMAL HIGH (ref 13.0–17.0)
MCH: 33.3 pg (ref 26.0–34.0)
MCHC: 36 g/dL (ref 30.0–36.0)
MCV: 92.3 fL (ref 78.0–100.0)
RDW: 13.1 % (ref 11.5–15.5)

## 2012-03-17 LAB — URINE MICROSCOPIC-ADD ON

## 2012-03-17 NOTE — Pre-Procedure Instructions (Signed)
Terry Boyd  03/17/2012   Your procedure is scheduled on:  Monday March 21, 2011  Report to [facility] at 0630 AM.  Call this number if you have problems the morning of surgery: [phone]   Remember:   Do not eat food or drink liquids after midnight.   Take these medicines the morning of surgery with A SIP OF WATER: Synthroid, and Zoloft   Do not wear jewelry.  Do not wear lotions, powders, or perfumes. You may wear deodorant.             Men may shave face and neck.  Do not bring valuables to the hospital.  Contacts, dentures or bridgework may not be worn into surgery.  Leave suitcase in the car. After surgery it may be brought to your room.  For patients admitted to the hospital, checkout time is 11:00 AM the day of  discharge.   Patients discharged the day of surgery will not be allowed to drive  home.    Special Instructions: Shower using CHG 2 nights before surgery and the night before surgery.  If you shower the day of surgery use CHG.  Use special wash - you have one bottle of CHG for all showers.  You should use approximately 1/3 of the bottle for each shower.   Please read over the following fact sheets that you were given: Pain Booklet, Coughing and Deep Breathing, Blood Transfusion Information, MRSA Information and Surgical Site Infection Prevention

## 2012-03-20 ENCOUNTER — Inpatient Hospital Stay (HOSPITAL_COMMUNITY)
Admission: RE | Admit: 2012-03-20 | Discharge: 2012-03-22 | DRG: 254 | Disposition: A | Discharge status: Home / Self Care | Payer: Medicare Other | Source: Ambulatory Visit | Attending: Vascular Surgery | Admitting: Vascular Surgery

## 2012-03-20 ENCOUNTER — Encounter (HOSPITAL_COMMUNITY): Payer: Self-pay | Admitting: Certified Registered"

## 2012-03-20 ENCOUNTER — Encounter (HOSPITAL_COMMUNITY)
Admission: RE | Disposition: A | Discharge status: Home / Self Care | Payer: Self-pay | Source: Ambulatory Visit | Attending: Vascular Surgery

## 2012-03-20 ENCOUNTER — Ambulatory Visit (HOSPITAL_COMMUNITY): Payer: Medicare Other

## 2012-03-20 ENCOUNTER — Ambulatory Visit (HOSPITAL_COMMUNITY): Payer: Medicare Other | Admitting: Certified Registered"

## 2012-03-20 ENCOUNTER — Encounter (HOSPITAL_COMMUNITY): Payer: Self-pay | Admitting: *Deleted

## 2012-03-20 DIAGNOSIS — F3289 Other specified depressive episodes: Secondary | ICD-10-CM | POA: Diagnosis present

## 2012-03-20 DIAGNOSIS — J449 Chronic obstructive pulmonary disease, unspecified: Secondary | ICD-10-CM | POA: Diagnosis present

## 2012-03-20 DIAGNOSIS — M549 Dorsalgia, unspecified: Secondary | ICD-10-CM | POA: Diagnosis present

## 2012-03-20 DIAGNOSIS — G8929 Other chronic pain: Secondary | ICD-10-CM | POA: Diagnosis present

## 2012-03-20 DIAGNOSIS — I739 Peripheral vascular disease, unspecified: Secondary | ICD-10-CM | POA: Diagnosis present

## 2012-03-20 DIAGNOSIS — I70219 Atherosclerosis of native arteries of extremities with intermittent claudication, unspecified extremity: Secondary | ICD-10-CM

## 2012-03-20 DIAGNOSIS — I724 Aneurysm of artery of lower extremity: Principal | ICD-10-CM

## 2012-03-20 DIAGNOSIS — F17200 Nicotine dependence, unspecified, uncomplicated: Secondary | ICD-10-CM | POA: Diagnosis present

## 2012-03-20 DIAGNOSIS — J4489 Other specified chronic obstructive pulmonary disease: Secondary | ICD-10-CM | POA: Diagnosis present

## 2012-03-20 DIAGNOSIS — E039 Hypothyroidism, unspecified: Secondary | ICD-10-CM | POA: Diagnosis present

## 2012-03-20 DIAGNOSIS — M1990 Unspecified osteoarthritis, unspecified site: Secondary | ICD-10-CM | POA: Diagnosis present

## 2012-03-20 DIAGNOSIS — F329 Major depressive disorder, single episode, unspecified: Secondary | ICD-10-CM | POA: Diagnosis present

## 2012-03-20 DIAGNOSIS — Z01812 Encounter for preprocedural laboratory examination: Secondary | ICD-10-CM

## 2012-03-20 DIAGNOSIS — I10 Essential (primary) hypertension: Secondary | ICD-10-CM | POA: Diagnosis present

## 2012-03-20 HISTORY — PX: BYPASS GRAFT POPLITEAL TO POPLITEAL: SHX5763

## 2012-03-20 HISTORY — PX: ENDARTERECTOMY POPLITEAL: SHX5806

## 2012-03-20 HISTORY — PX: INTRAOPERATIVE ARTERIOGRAM: SHX5157

## 2012-03-20 LAB — CREATININE, SERUM
Creatinine, Ser: 1.27 mg/dL (ref 0.50–1.35)
GFR calc Af Amer: 65 mL/min — ABNORMAL LOW (ref >90)

## 2012-03-20 LAB — CBC
Hemoglobin: 16.2 g/dL (ref 13.0–17.0)
MCH: 32.7 pg (ref 26.0–34.0)
Platelets: 189 10*3/uL (ref 150–400)
RBC: 4.95 MIL/uL (ref 4.22–5.81)
WBC: 11.1 10*3/uL — ABNORMAL HIGH (ref 4.0–10.5)

## 2012-03-20 SURGERY — ENDARTERECTOMY, POPLITEAL
Anesthesia: General | Site: Leg Lower | Laterality: Left | Wound class: Clean

## 2012-03-20 MED ORDER — ONDANSETRON HCL 4 MG/2ML IJ SOLN
4.0000 mg | Freq: Four times a day (QID) | INTRAMUSCULAR | Status: DC | PRN
Start: 2012-03-20 — End: 2012-03-22

## 2012-03-20 MED ORDER — 0.9 % SODIUM CHLORIDE (POUR BTL) OPTIME
TOPICAL | Status: DC | PRN
  Administered 2012-03-20: 2000 mL

## 2012-03-20 MED ORDER — SODIUM CHLORIDE 0.9 % IV SOLN: 500.0000 mL | Freq: Once | INTRAVENOUS | Status: AC | PRN

## 2012-03-20 MED ORDER — GUAIFENESIN-DM 100-10 MG/5ML PO SYRP: 15.0000 mL | ORAL_SOLUTION | ORAL | Status: DC | PRN

## 2012-03-20 MED ORDER — ALBUTEROL SULFATE HFA 108 (90 BASE) MCG/ACT IN AERS
INHALATION_SPRAY | RESPIRATORY_TRACT | Status: DC | PRN
  Administered 2012-03-20 (×2): 2 via RESPIRATORY_TRACT

## 2012-03-20 MED ORDER — ASPIRIN EC 325 MG PO TBEC: 325.0000 mg | DELAYED_RELEASE_TABLET | Freq: Every day | ORAL | Status: DC

## 2012-03-20 MED ORDER — ENOXAPARIN SODIUM 30 MG/0.3ML ~~LOC~~ SOLN
30.0000 mg | SUBCUTANEOUS | Status: DC
  Administered 2012-03-21 – 2012-03-22 (×2): 30 mg via SUBCUTANEOUS
  Filled 2012-03-20 (×3): qty 0.3

## 2012-03-20 MED ORDER — IOHEXOL 300 MG/ML  SOLN
INTRAMUSCULAR | Status: DC | PRN
  Administered 2012-03-20: 20 mL via INTRAVENOUS

## 2012-03-20 MED ORDER — LABETALOL HCL 5 MG/ML IV SOLN
10.0000 mg | INTRAVENOUS | Status: DC | PRN
  Filled 2012-03-20: qty 4

## 2012-03-20 MED ORDER — ALUM & MAG HYDROXIDE-SIMETH 200-200-20 MG/5ML PO SUSP: 15.0000 mL | ORAL | Status: DC | PRN

## 2012-03-20 MED ORDER — ASPIRIN 325 MG PO TABS
325.0000 mg | ORAL_TABLET | Freq: Every day | ORAL | Status: DC
  Administered 2012-03-20 – 2012-03-22 (×3): 325 mg via ORAL
  Filled 2012-03-20 (×3): qty 1

## 2012-03-20 MED ORDER — POTASSIUM CHLORIDE CRYS ER 20 MEQ PO TBCR: 20.0000 meq | EXTENDED_RELEASE_TABLET | Freq: Once | ORAL | Status: AC | PRN

## 2012-03-20 MED ORDER — PROTAMINE SULFATE 10 MG/ML IV SOLN
INTRAVENOUS | Status: DC | PRN
  Administered 2012-03-20 (×2): 10 mg via INTRAVENOUS
  Administered 2012-03-20: 20 mg via INTRAVENOUS

## 2012-03-20 MED ORDER — DOPAMINE-DEXTROSE 3.2-5 MG/ML-% IV SOLN: 3.0000 ug/kg/min | INTRAVENOUS | Status: DC

## 2012-03-20 MED ORDER — HYDROMORPHONE HCL PF 1 MG/ML IJ SOLN
INTRAMUSCULAR | Status: AC
  Administered 2012-03-20: 0.5 mg via INTRAVENOUS
  Filled 2012-03-20: qty 1

## 2012-03-20 MED ORDER — DEXTROSE 5 % IV SOLN
1.5000 g | INTRAVENOUS | Status: DC | PRN
  Administered 2012-03-20: 1.5 g via INTRAVENOUS

## 2012-03-20 MED ORDER — SODIUM CHLORIDE 0.9 % IR SOLN
Status: DC | PRN
  Administered 2012-03-20: 10:00:00

## 2012-03-20 MED ORDER — LIDOCAINE HCL (CARDIAC) 20 MG/ML IV SOLN
INTRAVENOUS | Status: DC | PRN
  Administered 2012-03-20: 70 mg via INTRAVENOUS

## 2012-03-20 MED ORDER — LACTATED RINGERS IV SOLN: INTRAVENOUS | Status: DC

## 2012-03-20 MED ORDER — OXYCODONE HCL 5 MG PO TABS: 5.0000 mg | ORAL_TABLET | Freq: Once | ORAL | Status: DC | PRN

## 2012-03-20 MED ORDER — EPHEDRINE SULFATE 50 MG/ML IJ SOLN
INTRAMUSCULAR | Status: DC | PRN
  Administered 2012-03-20 (×2): 10 mg via INTRAVENOUS

## 2012-03-20 MED ORDER — NEOSTIGMINE METHYLSULFATE 1 MG/ML IJ SOLN
INTRAMUSCULAR | Status: DC | PRN
  Administered 2012-03-20: 3 mg via INTRAVENOUS

## 2012-03-20 MED ORDER — SERTRALINE HCL 50 MG PO TABS
50.0000 mg | ORAL_TABLET | Freq: Every day | ORAL | Status: DC
  Administered 2012-03-21 – 2012-03-22 (×2): 50 mg via ORAL
  Filled 2012-03-20 (×3): qty 1

## 2012-03-20 MED ORDER — THROMBIN 20000 UNITS EX SOLR
CUTANEOUS | Status: AC
  Filled 2012-03-20: qty 20000

## 2012-03-20 MED ORDER — PANTOPRAZOLE SODIUM 40 MG PO TBEC
40.0000 mg | DELAYED_RELEASE_TABLET | Freq: Every day | ORAL | Status: DC
  Administered 2012-03-20 – 2012-03-22 (×3): 40 mg via ORAL
  Filled 2012-03-20 (×3): qty 1

## 2012-03-20 MED ORDER — OXYCODONE HCL 5 MG/5ML PO SOLN: 5.0000 mg | Freq: Once | ORAL | Status: DC | PRN

## 2012-03-20 MED ORDER — ACETAMINOPHEN 325 MG PO TABS: 325.0000 mg | ORAL_TABLET | ORAL | Status: DC | PRN

## 2012-03-20 MED ORDER — HYDROMORPHONE HCL PF 1 MG/ML IJ SOLN
0.2500 mg | INTRAMUSCULAR | Status: DC | PRN
  Administered 2012-03-20: 0.5 mg via INTRAVENOUS

## 2012-03-20 MED ORDER — LACTATED RINGERS IV SOLN
INTRAVENOUS | Status: DC | PRN
  Administered 2012-03-20 (×2): via INTRAVENOUS

## 2012-03-20 MED ORDER — LORAZEPAM 1 MG PO TABS
1.0000 mg | ORAL_TABLET | Freq: Every evening | ORAL | Status: DC | PRN
  Administered 2012-03-20: 1 mg via ORAL
  Filled 2012-03-20: qty 1

## 2012-03-20 MED ORDER — METOPROLOL TARTRATE 1 MG/ML IV SOLN: 2.0000 mg | INTRAVENOUS | Status: DC | PRN

## 2012-03-20 MED ORDER — SODIUM CHLORIDE 0.9 % IV SOLN
INTRAVENOUS | Status: DC
  Administered 2012-03-20: 13:00:00 via INTRAVENOUS

## 2012-03-20 MED ORDER — SODIUM CHLORIDE 0.9 % IV SOLN: INTRAVENOUS | Status: DC

## 2012-03-20 MED ORDER — FENTANYL CITRATE 0.05 MG/ML IJ SOLN
INTRAMUSCULAR | Status: DC | PRN
  Administered 2012-03-20: 100 ug via INTRAVENOUS
  Administered 2012-03-20 (×2): 50 ug via INTRAVENOUS
  Administered 2012-03-20 (×2): 25 ug via INTRAVENOUS

## 2012-03-20 MED ORDER — PHENOL 1.4 % MT LIQD
1.0000 | OROMUCOSAL | Status: DC | PRN
  Filled 2012-03-20: qty 177

## 2012-03-20 MED ORDER — ONDANSETRON HCL 4 MG/2ML IJ SOLN
INTRAMUSCULAR | Status: DC | PRN
  Administered 2012-03-20: 4 mg via INTRAVENOUS

## 2012-03-20 MED ORDER — SUCCINYLCHOLINE CHLORIDE 20 MG/ML IJ SOLN
INTRAMUSCULAR | Status: DC | PRN
  Administered 2012-03-20: 120 mg via INTRAVENOUS

## 2012-03-20 MED ORDER — OXYCODONE HCL 5 MG PO TABS
5.0000 mg | ORAL_TABLET | ORAL | Status: DC | PRN
  Administered 2012-03-20 – 2012-03-22 (×6): 10 mg via ORAL
  Filled 2012-03-20 (×6): qty 2

## 2012-03-20 MED ORDER — HYDRALAZINE HCL 20 MG/ML IJ SOLN
10.0000 mg | INTRAMUSCULAR | Status: DC | PRN
  Filled 2012-03-20: qty 0.5

## 2012-03-20 MED ORDER — HYDROMORPHONE HCL PF 1 MG/ML IJ SOLN
0.5000 mg | INTRAMUSCULAR | Status: DC | PRN
  Administered 2012-03-21 (×2): 1 mg via INTRAVENOUS
  Filled 2012-03-20 (×2): qty 1

## 2012-03-20 MED ORDER — HEPARIN SODIUM (PORCINE) 1000 UNIT/ML IJ SOLN
INTRAMUSCULAR | Status: DC | PRN
  Administered 2012-03-20: 3000 [IU] via INTRAVENOUS
  Administered 2012-03-20: 10000 [IU] via INTRAVENOUS

## 2012-03-20 MED ORDER — LEVOTHYROXINE SODIUM 125 MCG PO TABS
125.0000 ug | ORAL_TABLET | Freq: Every day | ORAL | Status: DC
  Administered 2012-03-21 – 2012-03-22 (×2): 125 ug via ORAL
  Filled 2012-03-20 (×3): qty 1

## 2012-03-20 MED ORDER — GLYCOPYRROLATE 0.2 MG/ML IJ SOLN
INTRAMUSCULAR | Status: DC | PRN
  Administered 2012-03-20: 0.4 mg via INTRAVENOUS
  Administered 2012-03-20: 0.2 mg via INTRAVENOUS

## 2012-03-20 MED ORDER — ONDANSETRON HCL 4 MG/2ML IJ SOLN: 4.0000 mg | Freq: Four times a day (QID) | INTRAMUSCULAR | Status: DC | PRN

## 2012-03-20 MED ORDER — DEXTROSE 5 % IV SOLN
1.5000 g | Freq: Two times a day (BID) | INTRAVENOUS | Status: AC
  Administered 2012-03-20 – 2012-03-21 (×2): 1.5 g via INTRAVENOUS
  Filled 2012-03-20 (×2): qty 1.5

## 2012-03-20 MED ORDER — PROPOFOL 10 MG/ML IV BOLUS
INTRAVENOUS | Status: DC | PRN
  Administered 2012-03-20: 160 mg via INTRAVENOUS
  Administered 2012-03-20: 40 mg via INTRAVENOUS

## 2012-03-20 MED ORDER — ACETAMINOPHEN 650 MG RE SUPP: 325.0000 mg | RECTAL | Status: DC | PRN

## 2012-03-20 MED ORDER — DOCUSATE SODIUM 100 MG PO CAPS
100.0000 mg | ORAL_CAPSULE | Freq: Every day | ORAL | Status: DC
  Administered 2012-03-21 – 2012-03-22 (×2): 100 mg via ORAL
  Filled 2012-03-20 (×2): qty 1

## 2012-03-20 MED ORDER — ROCURONIUM BROMIDE 100 MG/10ML IV SOLN
INTRAVENOUS | Status: DC | PRN
  Administered 2012-03-20: 20 mg via INTRAVENOUS
  Administered 2012-03-20 (×3): 10 mg via INTRAVENOUS

## 2012-03-20 SURGICAL SUPPLY — 61 items
ADH SKN CLS APL DERMABOND .7 (GAUZE/BANDAGES/DRESSINGS) ×2
BANDAGE ESMARK 6X9 LF (GAUZE/BANDAGES/DRESSINGS) IMPLANT
BNDG CMPR 9X6 STRL LF SNTH (GAUZE/BANDAGES/DRESSINGS)
BNDG ESMARK 6X9 LF (GAUZE/BANDAGES/DRESSINGS)
CANISTER SUCTION 2500CC (MISCELLANEOUS) ×2 IMPLANT
CANNULA VESSEL W/WING WO/VALVE (CANNULA) ×2 IMPLANT
CLIP TI MEDIUM 24 (CLIP) ×2 IMPLANT
CLIP TI WIDE RED SMALL 24 (CLIP) ×2 IMPLANT
CLOTH BEACON ORANGE TIMEOUT ST (SAFETY) ×2 IMPLANT
COVER PROBE W GEL 5X96 (DRAPES) ×2 IMPLANT
COVER SURGICAL LIGHT HANDLE (MISCELLANEOUS) ×2 IMPLANT
CUFF TOURNIQUET SINGLE 24IN (TOURNIQUET CUFF) IMPLANT
CUFF TOURNIQUET SINGLE 34IN LL (TOURNIQUET CUFF) IMPLANT
CUFF TOURNIQUET SINGLE 44IN (TOURNIQUET CUFF) IMPLANT
DERMABOND ADVANCED (GAUZE/BANDAGES/DRESSINGS) ×2
DERMABOND ADVANCED .7 DNX12 (GAUZE/BANDAGES/DRESSINGS) ×2 IMPLANT
DRAIN SNY WOU (WOUND CARE) IMPLANT
DRAPE PROXIMA HALF (DRAPES) ×2 IMPLANT
DRAPE WARM FLUID 44X44 (DRAPE) ×2 IMPLANT
DRAPE X-RAY CASS 24X20 (DRAPES) ×4 IMPLANT
DRSG COVADERM 4X8 (GAUZE/BANDAGES/DRESSINGS) IMPLANT
ELECT REM PT RETURN 9FT ADLT (ELECTROSURGICAL) ×2
ELECTRODE REM PT RTRN 9FT ADLT (ELECTROSURGICAL) ×1 IMPLANT
EVACUATOR SILICONE 100CC (DRAIN) IMPLANT
GLOVE BIO SURGEON STRL SZ7.5 (GLOVE) ×4 IMPLANT
GLOVE BIOGEL M 6.5 STRL (GLOVE) ×4 IMPLANT
GLOVE BIOGEL PI IND STRL 6.5 (GLOVE) ×1 IMPLANT
GLOVE BIOGEL PI IND STRL 7.5 (GLOVE) ×2 IMPLANT
GLOVE BIOGEL PI INDICATOR 6.5 (GLOVE) ×1
GLOVE BIOGEL PI INDICATOR 7.5 (GLOVE) ×2
GLOVE SS BIOGEL STRL SZ 8 (GLOVE) ×2 IMPLANT
GLOVE SUPERSENSE BIOGEL SZ 8 (GLOVE) ×2
GLOVE SURG SS PI 6.0 STRL IVOR (GLOVE) ×2 IMPLANT
GLOVE SURG SS PI 7.5 STRL IVOR (GLOVE) ×4 IMPLANT
GOWN PREVENTION PLUS XLARGE (GOWN DISPOSABLE) ×4 IMPLANT
GOWN STRL NON-REIN LRG LVL3 (GOWN DISPOSABLE) ×6 IMPLANT
KIT BASIN OR (CUSTOM PROCEDURE TRAY) ×2 IMPLANT
KIT ROOM TURNOVER OR (KITS) ×2 IMPLANT
NS IRRIG 1000ML POUR BTL (IV SOLUTION) ×4 IMPLANT
PACK PERIPHERAL VASCULAR (CUSTOM PROCEDURE TRAY) ×2 IMPLANT
PAD ARMBOARD 7.5X6 YLW CONV (MISCELLANEOUS) ×4 IMPLANT
PADDING CAST COTTON 6X4 STRL (CAST SUPPLIES) IMPLANT
SPONGE SURGIFOAM ABS GEL 100 (HEMOSTASIS) IMPLANT
STAPLER VISISTAT 35W (STAPLE) IMPLANT
SUT ETHILON 3 0 PS 1 (SUTURE) IMPLANT
SUT PROLENE 5 0 C 1 24 (SUTURE) ×2 IMPLANT
SUT PROLENE 6 0 CC (SUTURE) ×6 IMPLANT
SUT PROLENE 7 0 BV 1 (SUTURE) ×4 IMPLANT
SUT SILK 0 TIES 10X30 (SUTURE) ×2 IMPLANT
SUT SILK 3 0 (SUTURE) ×2
SUT SILK 3-0 18XBRD TIE 12 (SUTURE) ×1 IMPLANT
SUT VIC AB 2-0 CTX 36 (SUTURE) ×2 IMPLANT
SUT VIC AB 3-0 SH 27 (SUTURE) ×2
SUT VIC AB 3-0 SH 27X BRD (SUTURE) ×1 IMPLANT
SUT VIC AB 4-0 PS2 27 (SUTURE) ×4 IMPLANT
TAPE UMBILICAL COTTON 1/8X30 (MISCELLANEOUS) ×2 IMPLANT
TOWEL OR 17X24 6PK STRL BLUE (TOWEL DISPOSABLE) ×4 IMPLANT
TOWEL OR 17X26 10 PK STRL BLUE (TOWEL DISPOSABLE) ×4 IMPLANT
TRAY FOLEY CATH 14FRSI W/METER (CATHETERS) ×2 IMPLANT
UNDERPAD 30X30 INCONTINENT (UNDERPADS AND DIAPERS) ×2 IMPLANT
WATER STERILE IRR 1000ML POUR (IV SOLUTION) ×2 IMPLANT

## 2012-03-20 NOTE — Op Note (Signed)
Procedure: Left above-knee popliteal to below knee popliteal bypass, intraoperative arteriogram,ipsilateral reversed greater saphenous vein, ultrasound vein mapping Preoperative diagnosis: Left popliteal aneurysm Postoperative diagnosis: Same  Anesthesia: General  Asst.: Doreatha Massed, PA-C  Operative findings:  #1 3 mm left greater saphenous vein reversed    #2 Patent below-knee popliteal anastomosis with 2-vessel runoff via the peroneal and posterior tibial artery    #3 Ultrasound vein mapping   Operative details: After obtaining informed consent, the patient was taken to the operating room. The patient was placed in supine position on the operating room table. After induction of general anesthesia and endotracheal intubation, a Foley catheter was placed. Next, the patient's entire left lower extremity was prepped and draped in the usual sterile fashion. Ultrasound was used to identify the greater saphenous vein.  A longitudinal incision was then made on the medial aspect of the left leg.  The greater saphenous vein was dissected free circumferentially and side branches ligated and divided between silk ties.  The vein was approximately 3 mm in diameter.  A skin bridge was made at the knee joint and an additional longitudinal incision made over the vein on the medial aspect of the leg in the lower thigh.  The vein was dissected in similar fashion and was of similar quality.  The above knee popliteal artery was then dissected free by reflecting the sartorius posteriorly.  The artery was pulsatile and of normal caliber at this level which was at the adductor hiatus.  A vessel loop was placed around this.   The below knee popliteal incision was then deepened through the fascia to expose the below knee popliteal vessel which was of good quality and also pulsatile.  A vessel loop was placed around this.  A tunnel was then created from the below knee to above knee space between the heads of the gastrocnemius  muscle and an umbilical tape placed in the tunnel.    Next the left greater saphenous vein was harvested through theskip incisions in the medial left leg. The vein was of good quality approximately 3 mm and uniform diameter throughout its course. Side branches were ligated and divided between silk ties or clips. The proximal and distal ends were ligated with silk ties.  The patient was given 10000 units of heparin and an additional 3000 units of heparin during the case.    After appropriate circulation time of the heparin, the proximal left popliteal artery was controlled with a henley clamp. The artery was ligated just behind the knee joint and transected above this. The vein was gently distended with heparinized saline and reversed.   The vein was then spatulated and sewn end of vein to end of artery using a running 6-0 Prolene suture. At completion the henley clamp was released and there was good pulsatile flow through the graft. The graft was marked for orientation.   The graft was then brought through the tunnel down to the below-knee popliteal artery. The below-knee popliteal artery was controlled proximally with a Henley clamp and distally with a henley clamp. A longitudinal opening was made in the distal below-knee popliteal artery in an area that was fairly free of calcification. The graft was then cut to length and spatulated and sewn end of graft to side of artery using running 6-0 Prolene suture. At completion of the anastomosis everything was forebled backbled and thoroughly flushed. The remainder of the anastomosis was completed and all clamps were removed restoring pulsatile flow to the below-knee popliteal artery. There  was a palpable PT pulse and good doppler signal in the graft and posterior tibial artery.  The popliteal artery above the distal anastomosis was then ligated with a 0 silk tie.   An intraoperative arteriogram was then obtained using inflow occlusion. A 21-gauge butterfly needle  was introduced in the proximal aspect of the graft and with the graft clamped proximally contrast was injected through the butterfly needle. This showed good opacification of the distal anastomosis which was patent. There was two vessel runoff via the posterior tibial and peroneal artery. The 21 gauge butterfly needle was then removed and the hole repaired with a single 6-0 Prolene suture. The patient had biphasic Doppler flow in the posterior area of the foot. This augmented approximately 100% with unclamping the graft.   The patient was given 50 mg of protamine.  After hemostasis was obtained, the deep layers and subcutaneous layers of the above-knee popliteal incision were closed with running 3-0 Vicryl suture. The skin was closed with a 4-0 Vicryl subcuticular stitch. The below knee incision was inspected and found to be hemostatic. This was then closed in multiple layers of running 3-0 Vicryl suture and 4-0 subcuticular stitch. The patient tolerated the procedure well and there were no complications. Instrument sponge and needle counts were correct at the end of the case. Patient was taken to the recovery in stable condition.   Fabienne Bruns, MD  [facility] [place]  Office: [phone]  Pager: [phone]

## 2012-03-20 NOTE — Progress Notes (Signed)
Utilization review completed.  

## 2012-03-20 NOTE — Anesthesia Procedure Notes (Signed)
Procedure Name: Intubation Date/Time: 03/20/2012 8:41 AM Performed by: Arlice Colt B Pre-anesthesia Checklist: Patient identified, Emergency Drugs available, Suction available, Patient being monitored and Timeout performed Patient Re-evaluated:Patient Re-evaluated prior to inductionOxygen Delivery Method: Circle system utilized Preoxygenation: Pre-oxygenation with 100% oxygen Intubation Type: IV induction and Rapid sequence Laryngoscope Size: Mac and 4 Grade View: Grade I Tube type: Oral Tube size: 7.5 mm Number of attempts: 1 Airway Equipment and Method: Stylet Placement Confirmation: ETT inserted through vocal cords under direct vision,  positive ETCO2 and breath sounds checked- equal and bilateral Secured at: 23 cm Tube secured with: Tape Dental Injury: Teeth and Oropharynx as per pre-operative assessment

## 2012-03-20 NOTE — Transfer of Care (Signed)
Immediate Anesthesia Transfer of Care Note  Patient: Terry Boyd  Procedure(s) Performed: Procedure(s) (LRB) with comments: ENDARTERECTOMY POPLITEAL (Left) -  LIGATION OF LEFT LEG POPLITEAL ANEURYSM BYPASS GRAFT POPLITEAL TO POPLITEAL (Left) - ABOVE THE KNEE POPLITEAL ARTERY TO BELOW THE KNEE POPLITEAL ARTERY BYPASS GRAFT USING REVERSE LEFT GREATER SAPHENOUS VEIN  INTRA OPERATIVE ARTERIOGRAM (Left) - TIMES ONE  Patient Location: PACU  Anesthesia Type:General  Level of Consciousness: awake and alert   Airway & Oxygen Therapy: Patient Spontanous Breathing and Patient connected to nasal cannula oxygen  Post-op Assessment: Report given to PACU RN and Post -op Vital signs reviewed and stable  Post vital signs: Reviewed and stable  Complications: No apparent anesthesia complications

## 2012-03-20 NOTE — Interval H&P Note (Signed)
History and Physical Interval Note:  03/20/2012 7:30 AM  Terry Boyd  has presented today for surgery, with the diagnosis of POPLITEAL ANY  The various methods of treatment have been discussed with the patient and family. After consideration of risks, benefits and other options for treatment, the patient has consented to  Procedure(s) (LRB) with comments: ENDARTERECTOMY POPLITEAL (Left) - REPAIR POPLITEAL ANEURYSM as a surgical intervention .  The patient's history has been reviewed, patient examined, no change in status, stable for surgery.  I have reviewed the patient's chart and labs.  Questions were answered to the patient's satisfaction.     Terry Boyd E

## 2012-03-20 NOTE — Anesthesia Preprocedure Evaluation (Signed)
Anesthesia Evaluation  Patient identified by MRN, date of birth, ID band Patient awake    Reviewed: Allergy & Precautions, H&P , NPO status , Patient's Chart, lab work & pertinent test results  Airway Mallampati: II  Neck ROM: full    Dental   Pulmonary shortness of breath, COPDCurrent Smoker,          Cardiovascular hypertension, + Peripheral Vascular Disease     Neuro/Psych Depression    GI/Hepatic   Endo/Other  Hypothyroidism   Renal/GU      Musculoskeletal   Abdominal   Peds  Hematology   Anesthesia Other Findings   Reproductive/Obstetrics                           Anesthesia Physical Anesthesia Plan  ASA: III  Anesthesia Plan: General   Post-op Pain Management: MAC Combined w/ Regional for Post-op pain   Induction: Intravenous  Airway Management Planned: Oral ETT  Additional Equipment: Arterial line  Intra-op Plan:   Post-operative Plan: Extubation in OR  Informed Consent: I have reviewed the patients History and Physical, chart, labs and discussed the procedure including the risks, benefits and alternatives for the proposed anesthesia with the patient or authorized representative who has indicated his/her understanding and acceptance.     Plan Discussed with: CRNA and Surgeon  Anesthesia Plan Comments:         Anesthesia Quick Evaluation

## 2012-03-20 NOTE — H&P (View-Only) (Signed)
[facility] [place]  HISTORY AND PHYSICAL  History of Present Illness: Patient is a 72 y.o. year old male who presents for consideration of repair of a left popliteal aneurysm. 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 He does have a history of bilateral lower extremity pain. He previously had Gore Excluder aneurysm stent graft repair in 2011. Over the past several months he has complained of burning and numbness tingling in both legs extending from the knee to the foot. Both legs are equal. This is not activity related. It can occur at rest or with activity. He denies rest pain. 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 He was sent for further evaluation by Dr. Dimas Aguas based on an ABI from inside imaging which showed an ABI of 0.6 on the left and 1.0 on the right. Other medical problems include tobacco abuse, chronic back pain, hypertension, thyroid disease, COPD. These are all currently stable. He is currently taking Ativan which he says helps his leg pain. He does have a known history of a popliteal aneurysm that we have been following.  Past Medical History   Diagnosis  Date   .  AAA (abdominal aortic aneurysm)    .  Smoker    .  Chronic back pain    .  Hypertension    .  Thyroid disease    .  COPD (chronic obstructive pulmonary disease)     Past Surgical History   Procedure  Date   .  Appendectomy    .  Abdominal aortic aneurysm repair  04-22-2009     Stent graft repair of AAA   Social History  History   Substance Use Topics   .  Smoking status:  Current Some Day Smoker -- 57 years     Types:  Cigars   .  Smokeless tobacco:  Not on file   .  Alcohol Use:  No   Family History  Family History   Problem  Relation  Age of Onset   .  Diabetes  Mother    .  Heart disease  Father    .  Diabetes  Father    .  Heart disease  Sister       CABG x 4    .  Cancer  Brother       throat cancer   Allergies    Allergies   Allergen  Reactions   .  Morphine And Related      hallucinations    Current Outpatient Prescriptions   Medication  Sig  Dispense  Refill   .  levothyroxine (SYNTHROID, LEVOTHROID) 100 MCG tablet  Take 100 mcg by mouth daily.     Marland Kitchen  LORazepam (ATIVAN) 1 MG tablet  Take 1 mg by mouth as needed.     .  sertraline (ZOLOFT) 50 MG tablet  Take 50 mg by mouth daily.     Marland Kitchen  lisinopril (PRINIVIL,ZESTRIL) 10 MG tablet  Take 10 mg by mouth daily.     ROS:  General: No weight loss, Fever, chills  HEENT: No recent headaches, no nasal bleeding, no visual changes, no sore throat  Neurologic: No dizziness, blackouts, seizures. No recent symptoms of stroke or mini- stroke. No recent episodes of slurred speech, or temporary blindness.  Cardiac: No recent episodes of chest pain/pressure, no shortness of breath at rest. No shortness of breath with exertion. Denies  history of atrial fibrillation or irregular heartbeat  Vascular: No history of rest pain in feet. No history of claudication. No history of non-healing ulcer, No history of DVT  Pulmonary: No home oxygen, no productive cough, no hemoptysis, No asthma or wheezing  Musculoskeletal: [ ]  Arthritis, [x ] Low back pain, [ ]  Joint pain  Hematologic:No history of hypercoagulable state. No history of easy bleeding. No history of anemia  Gastrointestinal: No hematochezia or melena, No gastroesophageal reflux, no trouble swallowing  Urinary: [ ]  chronic Kidney disease, [ ]  on HD - [ ]  MWF or [ ]  TTHS, [ ]  Burning with urination, [ ]  Frequent urination, [ ]  Difficulty urinating;  Skin: No rashes  Psychological: No history of anxiety, No history of depression  Physical Examination  Filed Vitals:   03/02/12 0920  BP: 151/73  Pulse: 91  Height: 5' 9.5" (1.765 m)  Weight: 188 lb 14.4 oz (85.684 kg)  SpO2: 96%   General: Alert and oriented, no acute distress  HEENT: Normal  Neck: No bruit or JVD  Pulmonary: Clear to auscultation bilaterally   Cardiac: Regular Rate and Rhythm without murmur  Abdomen: Soft, non-tender, non-distended, no mass, no scars  Skin: No rash  Extremity Pulses: 2+ radial, brachial, femoral, absent dorsalis pedis, posterior tibial pulses bilaterally, popliteal pulses are full bilaterally  Musculoskeletal: No deformity or edema  Neurologic: Upper and lower extremity motor 5/5 and symmetric   DATA: He had bilateral ABIs performed recently which were actually normal and greater than 1 bilaterally. He also had digit pressures which were 74 on the left 82 on the right which were also within normal limits. Waveforms were triphasic bilaterally which were within normal limits. However, duplex of his left popliteal artery aneurysm shows his aneurysm has grown somewhat. It is now 1.95 x 1.99 cm. There was moderate intramural thrombus. The left popliteal aneurysm was 1.5 cm in diameter in 2011. He had no evidence of a right popliteal aneurysm in 2011.   ASSESSMENT: Bilateral lower extremity pain which sounds more like neuropathy and claudication. The patient had normal ABIs. He does have a popliteal aneurysm which is at a size we should consider repair of this. I discussed with the patient today the risk of distal embolization from an occluded popliteal aneurysm. At this point he wishes repair of a left popliteal aneurysm.  Recent cardiac risk stratification by Dr. Dietrich Pates and Eden Emms shows reasonable operative risk  PLAN:  Bilateral lower extremity arteriogram January 17. Repair of left popliteal aneurysm January 20. Risks benefits possible complications procedure details of the arteriogram in the popliteal aneurysm repair were discussed with the patient today. He understands and agrees to proceed.  Fabienne Bruns, MD  [facility] [place]  Office: [phone]  Pager: [phone]

## 2012-03-20 NOTE — Anesthesia Postprocedure Evaluation (Signed)
Anesthesia Post Note  Patient: Terry Boyd  Procedure(s) Performed: Procedure(s) (LRB): ENDARTERECTOMY POPLITEAL (Left) BYPASS GRAFT POPLITEAL TO POPLITEAL (Left) INTRA OPERATIVE ARTERIOGRAM (Left)  Anesthesia type: General  Patient location: PACU  Post pain: Pain level controlled and Adequate analgesia  Post assessment: Post-op Vital signs reviewed, Patient's Cardiovascular Status Stable, Respiratory Function Stable, Patent Airway and Pain level controlled  Last Vitals:  Filed Vitals:   03/20/12 1315  BP: 117/71  Pulse: 72  Temp:   Resp: 18    Post vital signs: Reviewed and stable  Level of consciousness: awake, alert  and oriented  Complications: No apparent anesthesia complications

## 2012-03-21 LAB — BASIC METABOLIC PANEL
CO2: 25 mEq/L (ref 19–32)
Calcium: 8.2 mg/dL — ABNORMAL LOW (ref 8.4–10.5)
GFR calc non Af Amer: 57 mL/min — ABNORMAL LOW (ref >90)
Glucose, Bld: 122 mg/dL — ABNORMAL HIGH (ref 70–99)
Potassium: 4.2 mEq/L (ref 3.5–5.1)
Sodium: 134 mEq/L — ABNORMAL LOW (ref 135–145)

## 2012-03-21 LAB — CBC
Hemoglobin: 14.8 g/dL (ref 13.0–17.0)
MCHC: 34.9 g/dL (ref 30.0–36.0)
Platelets: 177 10*3/uL (ref 150–400)
RBC: 4.49 MIL/uL (ref 4.22–5.81)

## 2012-03-21 NOTE — Evaluation (Signed)
Occupational Therapy Evaluation Patient Details Name: Terry Boyd MRN: [id number] DOB: 12-May-1940 Today's Date: 03/21/2012 Time: 1152-1204 OT Time Calculation (min): 12 min  OT Assessment / Plan / Recommendation Clinical Impression  72 yo male admitted for ENDARTERECTOMY POPLITEAL (Left) - REPAIR POPLITEAL ANEURYSM . Recommend HHOT for d/c planning. Ot to follow acutely.    OT Assessment  Patient needs continued OT Services    Follow Up Recommendations  Home health OT    Barriers to Discharge      Equipment Recommendations  3 in 1 bedside comode    Recommendations for Other Services    Frequency  Min 2X/week    Precautions / Restrictions Precautions Precautions: Fall Restrictions Weight Bearing Restrictions: No   Pertinent Vitals/Pain Tolerating well    ADL  Eating/Feeding: Independent Where Assessed - Eating/Feeding: Chair Grooming: Wash/dry hands;Min guard Where Assessed - Grooming: Unsupported standing Upper Body Bathing: Chest;Right arm;Left arm;Abdomen;Min guard Where Assessed - Upper Body Bathing: Unsupported standing Lower Body Bathing: Minimal assistance Where Assessed - Lower Body Bathing: Unsupported sit to stand Upper Body Dressing: Independent Where Assessed - Upper Body Dressing: Unsupported sitting Lower Body Dressing: Minimal assistance Where Assessed - Lower Body Dressing: Unsupported sit to stand Toilet Transfer: Minimal assistance Toilet Transfer Method: Sit to stand Toilet Transfer Equipment: Regular height toilet (ambulating with hand held (A)) Equipment Used: Gait belt Transfers/Ambulation Related to ADLs: Pt ambulated with hand held (A) on Lt side. pt with toe touch down on LT side. Pt unsteady gait ADL Comments: Pt completed voiding bladder with urinal in bathroom standing at toilet using grab bar. Pt deomonstrates balance deficits and adls. Pt could benefit from RW next session to maximize independence.    OT Diagnosis: Acute pain  OT  Problem List: Decreased strength;Decreased activity tolerance;Impaired balance (sitting and/or standing);Decreased safety awareness;Decreased knowledge of use of DME or AE;Pain OT Treatment Interventions: Self-care/ADL training;DME and/or AE instruction;Therapeutic activities;Patient/family education;Balance training   OT Goals Acute Rehab OT Goals OT Goal Formulation: With patient Time For Goal Achievement: 04/04/12 Potential to Achieve Goals: Good ADL Goals Pt Will Perform Grooming: with modified independence;Unsupported;Standing at sink ADL Goal: Grooming - Progress: Goal set today Pt Will Perform Upper Body Bathing: with modified independence;Standing at sink;Sit to stand from chair ADL Goal: Upper Body Bathing - Progress: Goal set today Pt Will Perform Lower Body Bathing: with modified independence;Sit to stand from chair;Standing at sink ADL Goal: Lower Body Bathing - Progress: Goal set today Pt Will Perform Upper Body Dressing: with modified independence;Sit to stand from chair ADL Goal: Upper Body Dressing - Progress: Goal set today Pt Will Perform Lower Body Dressing: with modified independence;Sit to stand from chair ADL Goal: Lower Body Dressing - Progress: Goal set today Pt Will Transfer to Toilet: with modified independence;with DME;3-in-1 ADL Goal: Toilet Transfer - Progress: Goal set today  Visit Information  Last OT Received On: 03/21/12 Assistance Needed: +1    Subjective Data  Subjective: "I will have people coming in"- "I have my brothers and he is the same size as me"-  Patient Stated Goal: to return home tomorrow - "he said I would be here 3 days and that would be tomorrow"   Prior Functioning     Home Living Lives With: Alone Available Help at Discharge: Family;Available 24 hours/day;Friend(s) Type of Home: House Home Access: Stairs to enter [employer] of Steps: 2 Entrance Stairs-Rails: None Home Layout: Two level (basement with  laundry.) Bathroom Shower/Tub: Tub/shower unit;Door [employer] Toilet: Standard Home Adaptive Equipment:  Walker - rolling Prior Function Level of Independence: Independent Able to Take Stairs?: Yes Driving: Yes Vocation: Retired Musician: No difficulties Dominant Hand: Right         Vision/Perception     Cognition  Overall Cognitive Status: Appears within functional limits for tasks assessed/performed Arousal/Alertness: Awake/alert Orientation Level: Appears intact for tasks assessed Behavior During Session: [facility] for tasks performed    Extremity/Trunk Assessment Right Upper Extremity Assessment RUE ROM/Strength/Tone: Within functional levels RUE Sensation: WFL - Light Touch RUE Coordination: WFL - gross/fine motor Left Upper Extremity Assessment LUE ROM/Strength/Tone: Within functional levels LUE Sensation: WFL - Light Touch LUE Coordination: WFL - gross/fine motor Right Lower Extremity Assessment RLE ROM/Strength/Tone: Within functional levels Left Lower Extremity Assessment LLE ROM/Strength/Tone: Unable to fully assess;Due to pain (At least 3/5 gross)     Mobility Bed Mobility Bed Mobility: Supine to Sit Supine to Sit: 6: Modified independent (Device/Increase time) Transfers Sit to Stand: 4: Min assist;From bed Stand to Sit: 4: Min assist;To chair/3-in-1 Details for Transfer Assistance: (A) to initiate transfer and slowly descend to recliner with cues for hand placement     Shoulder Instructions     Exercise     Balance Balance Balance Assessed: Yes Static Sitting Balance Static Sitting - Balance Support: Feet supported Static Sitting - Level of Assistance: 6: Modified independent (Device/Increase time) Static Standing Balance Static Standing - Balance Support: During functional activity;No upper extremity supported Static Standing - Level of Assistance: 5: Stand by assistance Static Standing - Comment/# of Minutes: ~1 min   End of  Session OT - End of Session Activity Tolerance: Patient tolerated treatment well Patient left: in chair;with call bell/phone within reach Nurse Communication: Mobility status  GO     Harrel Carina [facility] 03/21/2012, 12:38 PM Pager: [phone]

## 2012-03-21 NOTE — Progress Notes (Signed)
Pt transferring to 2010, family notified and aware of new room, VSS, report called to [employer], pt to tx in w/c

## 2012-03-21 NOTE — Progress Notes (Addendum)
[facility] Progress Note  03/21/2012 7:26 AM POD 1  Subjective:  "I'm hurtin' this morning"  VSS 92%RA  Filed Vitals:   03/21/12 0700  BP: 136/78  Pulse:   Temp:   Resp:     Physical Exam: Incisions:  Both incisions are c/d/i. Extremities:  Left foot is warm and well perfused.  There is a good doppler signal in the left PT.  CBC    Component Value Date/Time   WBC 11.1* 03/20/2012 1558   RBC 4.95 03/20/2012 1558   HGB 16.2 03/20/2012 1558   HCT 46.5 03/20/2012 1558   PLT 189 03/20/2012 1558   MCV 93.9 03/20/2012 1558   MCH 32.7 03/20/2012 1558   MCHC 34.8 03/20/2012 1558   RDW 13.3 03/20/2012 1558    BMET    Component Value Date/Time   NA 134* 03/21/2012 0450   K 4.2 03/21/2012 0450   CL 101 03/21/2012 0450   CO2 25 03/21/2012 0450   GLUCOSE 122* 03/21/2012 0450   BUN 12 03/21/2012 0450   CREATININE 1.26 03/21/2012 0450   CALCIUM 8.2* 03/21/2012 0450   GFRNONAA 57* 03/21/2012 0450   GFRAA 65* 03/21/2012 0450    INR    Component Value Date/Time   INR 0.99 03/17/2012 1502     Intake/Output Summary (Last 24 hours) at 03/21/12 0726 Last data filed at 03/21/12 0400  Gross per 24 hour  Intake   4035 ml  Output    975 ml  Net   3060 ml     Assessment/Plan:  72 y.o. male is s/p:  Left above-knee popliteal to below knee popliteal bypass, intraoperative arteriogram,ipsilateral reversed greater saphenous vein, ultrasound vein mapping   POD 1  -pt doing well this am. -graft is patent -transfer to 2000 -mobilize   Doreatha Massed, PA-C [facility] [phone] 03/21/2012 7:26 AM    2+ PT pulse, incisions healing, ambulating some  Possible d/c tomorrow, pain control ambulation today  Fabienne Bruns, MD [facility] [place] Office: [phone] Pager: [phone]

## 2012-03-21 NOTE — Evaluation (Signed)
Physical Therapy Evaluation Patient Details Name: Terry Boyd MRN: [id number] DOB: [birth year] Today's Date: 03/21/2012 Time: 1152-1209 PT Time Calculation (min): 17 min  PT Assessment / Plan / Recommendation Clinical Impression  Pt is 72 y/o male admitted for s/p left fem-pop bypass graft.  Pt limited mobility due to pain but does not rate.  Pt motivated and willing to work with therapy.  Pt will benefit from acute PT services to improve overall mobility to prepare for safe d/c home.    PT Assessment  Patient needs continued PT services    Follow Up Recommendations  Home health PT;Supervision/Assistance - 24 hour    Barriers to Discharge None      Equipment Recommendations  None recommended by PT    Frequency Min 3X/week    Precautions / Restrictions Precautions Precautions: Fall Restrictions Weight Bearing Restrictions: No   Pertinent Vitals/Pain C/o pain left LE however tolerating with ambulation; does not rate       Mobility  Bed Mobility Bed Mobility: Supine to Sit Supine to Sit: 6: Modified independent (Device/Increase time) Transfers Transfers: Sit to Stand;Stand to Sit Sit to Stand: 4: Min assist;From bed Stand to Sit: 4: Min assist;To chair/3-in-1 Details for Transfer Assistance: (A) to initiate transfer and slowly descend to recliner with cues for hand placement Ambulation/Gait Ambulation/Gait Assistance: 4: Min assist Ambulation Distance (Feet): 30 Feet Assistive device: 1 person hand held assist Ambulation/Gait Assistance Details: (A) to maintain balance with cues for proper technique.  Pt with antaglic step to gait. Gait Pattern: Step-to pattern;Decreased stance time - left;Antalgic Gait velocity: decreased due to pain Stairs: No Wheelchair Mobility Wheelchair Mobility: No     PT Diagnosis: Difficulty walking;Generalized weakness;Acute pain  PT Problem List: Decreased strength;Decreased activity tolerance;Decreased balance;Decreased  mobility;Decreased knowledge of use of DME;Pain PT Treatment Interventions: DME instruction;Gait training;Stair training;Functional mobility training;Therapeutic activities;Therapeutic exercise;Balance training;Neuromuscular re-education;Patient/family education   PT Goals Acute Rehab PT Goals PT Goal Formulation: With patient Time For Goal Achievement: 03/28/12 Potential to Achieve Goals: Good Pt will go Supine/Side to Sit: with modified independence PT Goal: Supine/Side to Sit - Progress: Goal set today Pt will go Sit to Supine/Side: Independently PT Goal: Sit to Supine/Side - Progress: Goal set today Pt will go Sit to Stand: Independently PT Goal: Sit to Stand - Progress: Goal set today Pt will go Stand to Sit: with modified independence PT Goal: Stand to Sit - Progress: Goal set today Pt will Ambulate: >150 feet;with modified independence;with rolling walker PT Goal: Ambulate - Progress: Goal set today Pt will Go Up / Down Stairs: 1-2 stairs;with modified independence;with least restrictive assistive device PT Goal: Up/Down Stairs - Progress: Goal set today  Visit Information  Last PT Received On: 03/21/12 Assistance Needed: +1    Subjective Data  Subjective: "I can do it myself." Patient Stated Goal: To go home   Prior Functioning  Home Living Lives With: Alone Available Help at Discharge: Family;Available 24 hours/day;Friend(s) Type of Home: House Home Access: Stairs to enter [employer] of Steps: 2 Entrance Stairs-Rails: None Home Layout: Two level (basement with laundry.) Bathroom Shower/Tub: Tub/shower unit;Door [employer] Toilet: Standard Home Adaptive Equipment: Walker - rolling Prior Function Level of Independence: Independent Able to Take Stairs?: Yes Driving: Yes Vocation: Retired Musician: No difficulties Dominant Hand: Right    Cognition  Overall Cognitive Status: Appears within functional limits for tasks  assessed/performed Arousal/Alertness: Awake/alert Orientation Level: Appears intact for tasks assessed Behavior During Session: [facility] for tasks performed    Extremity/Trunk  Assessment Right Upper Extremity Assessment RUE ROM/Strength/Tone: Within functional levels RUE Sensation: WFL - Light Touch RUE Coordination: WFL - gross/fine motor Left Upper Extremity Assessment LUE ROM/Strength/Tone: Within functional levels LUE Sensation: WFL - Light Touch LUE Coordination: WFL - gross/fine motor Right Lower Extremity Assessment RLE ROM/Strength/Tone: Within functional levels Left Lower Extremity Assessment LLE ROM/Strength/Tone: Unable to fully assess;Due to pain (At least 3/5 gross)   Balance Balance Balance Assessed: Yes Static Sitting Balance Static Sitting - Balance Support: Feet supported Static Sitting - Level of Assistance: 6: Modified independent (Device/Increase time) Static Standing Balance Static Standing - Balance Support: During functional activity;No upper extremity supported Static Standing - Level of Assistance: 5: Stand by assistance Static Standing - Comment/# of Minutes: ~1 min  End of Session PT - End of Session Equipment Utilized During Treatment: Gait belt Activity Tolerance: Patient tolerated treatment well Patient left: in chair;with call bell/phone within reach Nurse Communication: Mobility status  GP     Terry Boyd 03/21/2012, 12:39 PM Jake Shark, PT DPT [phone]

## 2012-03-22 ENCOUNTER — Encounter (HOSPITAL_COMMUNITY): Payer: Self-pay | Admitting: Vascular Surgery

## 2012-03-22 DIAGNOSIS — Z48812 Encounter for surgical aftercare following surgery on the circulatory system: Secondary | ICD-10-CM

## 2012-03-22 LAB — TROPONIN I: Troponin I: <0.3 ng/mL (ref <0.30)

## 2012-03-22 MED ORDER — OXYCODONE HCL 5 MG PO TABS: 5.0000 mg | ORAL_TABLET | ORAL | Status: DC | PRN

## 2012-03-22 NOTE — Progress Notes (Signed)
VASCULAR LAB PRELIMINARY  ARTERIAL  ABI completed:    RIGHT    LEFT    PRESSURE WAVEFORM  PRESSURE WAVEFORM  BRACHIAL 127 Biphasic BRACHIAL 132 Biphasic  DP 149 Monophasic DP 111 Monophasic  AT   AT    PT 149 Biphasic PT 129 Triphasic  PER   PER    GREAT TOE  NA GREAT TOE  NA    RIGHT LEFT  ABI 1.13 0.98   ABIs are within normal limits bilaterally.  03/22/2012 1:13 PM Gertie Fey, RDMS, RDCS

## 2012-03-22 NOTE — Progress Notes (Signed)
Physical Therapy Treatment Patient Details Name: Terry Boyd MRN: [id number] DOB: April 23, 1940 Today's Date: 03/22/2012 Time: 1006-1020 PT Time Calculation (min): 14 min  PT Assessment / Plan / Recommendation Comments on Treatment Session  Pt c/o chest pain earlier so EKG and enzyme test ordered.  RN reports EKG was fine and that "he has been coughing alot, so you are good to work with him". Amb pt in hallway with RW.  Pt tolerated session well.     Follow Up Recommendations  Home health PT;Supervision/Assistance - 24 hour     Does the patient have the potential to tolerate intense rehabilitation     Barriers to Discharge        Equipment Recommendations  None recommended by PT (Pt has a RW)    Recommendations for Other Services    Frequency Min 3X/week   Plan Discharge plan remains appropriate    Precautions / Restrictions Precautions Precautions: Fall Precaution Comments: L LE incisions 2nd Popliteal Bypass Restrictions Weight Bearing Restrictions: No   Pertinent Vitals/Pain C/o 5/10 L knee pain "right behind the knee" Pre medicated    Mobility  Bed Mobility Bed Mobility: Not assessed Details for Bed Mobility Assistance: Pt sitting on EOB  Indep on arrival Transfers Transfers: Sit to Stand;Stand to Sit Sit to Stand: 4: Min guard;5: Supervision;From bed Stand to Sit: 4: Min guard;5: Supervision Details for Transfer Assistance: increased time due to L LE "discomfort" from proceedure Ambulation/Gait Ambulation/Gait Assistance: 4: Min guard;5: Supervision Ambulation Distance (Feet): 85 Feet Assistive device: Rolling walker Ambulation/Gait Assistance Details: Used a RW this session due to L LE "discomfort" with 5/10 pain "right behind the knee".  Pt also performing more of a L toe walk requiring increased use of AD for support.  Gait Pattern: Step-through pattern;Decreased stride length Gait velocity: decreased General Gait Details: increased time due to L LE  "discom,ort"     PT Goals                                                        Progressing well    Visit Information  Last PT Received On: 03/22/12 Assistance Needed: +1    Subjective Data  Subjective: I'm not able to put my full foot down on the floor like I could yesterday. Patient Stated Goal: home   Cognition       Balance     End of Session PT - End of Session Equipment Utilized During Treatment: Gait belt Activity Tolerance: Patient tolerated treatment well Patient left: in bed;with call bell/phone within reach   Felecia Shelling  PTA [facility]  Acute  Rehab Pager     [phone]

## 2012-03-22 NOTE — Progress Notes (Signed)
Patient articulated to the nurse that his chest feels so tight he can barely breath. The nurse asked him to describe further. The patient said it feels as if something is sitting on his chest. The nurse asked the patient how long he has been feeling this pressure/tightness, and the patient informed the nurse that he has been feeling this every since post surgery. The nurse initiated 1.5 L of O2  as a comfort measure for the patient. Harmon Pier, RN

## 2012-03-22 NOTE — Care Management Note (Signed)
    Page 1 of 1   03/22/2012     4:17:54 PM   CARE MANAGEMENT NOTE 03/22/2012  Patient:  Terry Boyd, Terry Boyd   Account Number:  [id number]  Date Initiated:  03/22/2012  Documentation initiated by:  Ladoris Lythgoe  Subjective/Objective Assessment:   PT S/P LT POPLITEAL BYPASS GRAFT ON 03/20/12.  PTA, PT LIVES ALONE, AND IS INDEPENDENT.     Action/Plan:   MET WITH PT TO DISCUSS DC PLANS.  DAUGHTER TO PROVIDE CARE AT DC.  WILL NEED HOME HEALTH FOLLOW UP.  PT IS AGREEABLE TO HOME CARE; REFERRAL TO [facility],PER PT CHOICE.  START OF CARE 24-48H POST DC DATE.   Anticipated DC Date:  03/22/2012   Anticipated DC Plan:  HOME W HOME HEALTH SERVICES      DC Planning Services  CM consult      [facility] Choice  HOME HEALTH   Choice offered to / List presented to:  C-1 Patient        HH arranged  HH-2 PT  HH-3 OT      [facility] agency  [facility]   Status of service:  Completed, signed off Medicare Important Message given?   (If response is "NO", the following Medicare IM given date fields will be blank) Date Medicare IM given:   Date Additional Medicare IM given:    Discharge Disposition:  HOME W HOME HEALTH SERVICES  Per UR Regulation:  Reviewed for med. necessity/level of care/duration of stay  If discussed at Long Length of Stay Meetings, dates discussed:    Comments:

## 2012-03-22 NOTE — Progress Notes (Signed)
Nutrition Brief Note  Patient identified on the Malnutrition Screening Tool (MST) Report.  Patient's weight has been stable per readings below:  Wt Readings from Last 10 Encounters:  03/20/12 187 lb 2.7 oz (84.9 kg)  03/20/12 187 lb 2.7 oz (84.9 kg)  03/17/12 188 lb 9.6 oz (85.548 kg)  03/14/12 183 lb (83.008 kg)  03/14/12 183 lb (83.008 kg)  03/02/12 188 lb 14.4 oz (85.684 kg)  02/02/12 185 lb (83.915 kg)  01/13/12 184 lb (83.462 kg)  03/27/09 179 lb (81.194 kg)    Body mass index is 26.86 kg/(m^2).  Patient meets criteria for Overweight based on current BMI.   Current diet order is Regular, patient is consuming approximately 100% of meals at this time.  Labs and medications reviewed.   No nutrition interventions warranted at this time.  Please consult RD as needed.  Maureen Chatters, RD, LDN Pager #: [phone] After-Hours Pager #: [phone]

## 2012-03-22 NOTE — Progress Notes (Signed)
VASCULAR PROGRESS NOTE  2 Days Post-Op s/p: Left above-knee popliteal to below knee popliteal bypass with vein  SUBJECTIVE: Complains of chest pain, but says that he has had it for 2 days.   PHYSICAL EXAM: Filed Vitals:   03/21/12 1059 03/21/12 1340 03/21/12 2001 03/22/12 0410  BP: 116/69 118/68 119/65 152/73  Pulse: 75 75 71 78  Temp: 98.5 F (36.9 C) 99.4 F (37.4 C) 98.2 F (36.8 C) 98.1 F (36.7 C)  TempSrc: Oral Oral Oral Oral  Resp: 19 18 18 18   Height:      Weight:      SpO2: 95% 95% 95% 95%   Incisions all look fine. Left foot warm.   LABS: Lab Results  Component Value Date   WBC 11.4* 03/21/2012   HGB 14.8 03/21/2012   HCT 42.4 03/21/2012   MCV 94.4 03/21/2012   PLT 177 03/21/2012   Lab Results  Component Value Date   CREATININE 1.26 03/21/2012   A/P: 1. Chest pain for 2 days. Will check EKG and enzymes. Not described as pressure and may simply be musculoskeletal pain.  2. If EKG and cardiac enzymes negative, then the plan was for D/C home today.   Cari Caraway Beeper: [phone] 03/22/2012

## 2012-03-22 NOTE — Progress Notes (Signed)
Discharge education and prescriptions given to pt. Pt verbalized his understanding. Pt is stable for discharge with his daughter. Will provide wheelchair for pt to ride out in to his car.

## 2012-03-22 NOTE — Discharge Summary (Signed)
[facility] Discharge Summary  Terry Boyd [birth year] 72 y.o. male  [id number]  Admission Date: 03/20/2012  Discharge Date: 03/22/12  Physician: Sherren Kerns, MD  Admission Diagnosis: POPLITEAL ANY   HPI:   This is a 72 y.o. male who presents for consideration of repair of a left popliteal aneurysm. 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 He does have a history of bilateral lower extremity pain. He previously had Gore Excluder aneurysm stent graft repair in 2011. Over the past several months he has complained of burning and numbness tingling in both legs extending from the knee to the foot. Both legs are equal. This is not activity related. It can occur at rest or with activity. He denies rest pain. 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 He was sent for further evaluation by Dr. Dimas Aguas based on an ABI from inside imaging which showed an ABI of 0.6 on the left and 1.0 on the right. Other medical problems include tobacco abuse, chronic back pain, hypertension, thyroid disease, COPD. These are all currently stable. He is currently taking Ativan which he says helps his leg pain. He does have a known history of a popliteal aneurysm that we have been following.  Hospital Course:  The patient was admitted to the hospital and taken to the operating room on 03/20/2012 and underwent  Left above-knee popliteal to below knee popliteal bypass, intraoperative arteriogram,ipsilateral reversed greater saphenous vein, ultrasound vein mapping   The pt tolerated the procedure well and was transported to the PACU in good condition. By POD 1, his graft was patent.  On POD 2, he described chest tightness and an EKG was performed and normal and his troponin level was also within normal limits.  He is discharged home on POD 2.  The remainder of the hospital course consisted of increasing mobilization and increasing intake  of solids without difficulty.  CBC    Component Value Date/Time   WBC 11.4* 03/21/2012 0450   RBC 4.49 03/21/2012 0450   HGB 14.8 03/21/2012 0450   HCT 42.4 03/21/2012 0450   PLT 177 03/21/2012 0450   MCV 94.4 03/21/2012 0450   MCH 33.0 03/21/2012 0450   MCHC 34.9 03/21/2012 0450   RDW 13.5 03/21/2012 0450    BMET    Component Value Date/Time   NA 134* 03/21/2012 0450   K 4.2 03/21/2012 0450   CL 101 03/21/2012 0450   CO2 25 03/21/2012 0450   GLUCOSE 122* 03/21/2012 0450   BUN 12 03/21/2012 0450   CREATININE 1.26 03/21/2012 0450   CALCIUM 8.2* 03/21/2012 0450   GFRNONAA 57* 03/21/2012 0450   GFRAA 65* 03/21/2012 0450     Discharge Instructions:   The patient is discharged to home with extensive instructions on wound care and progressive ambulation.  They are instructed not to drive or perform any heavy lifting until returning to see the physician in his office.  Discharge Orders    Future Orders Please Complete By Expires   Resume previous diet      Driving Restrictions      Comments:   No driving for 2 weeks   Lifting restrictions      Comments:   No lifting for 6 weeks   Call MD for:  temperature >100.5      Call MD for:  redness, tenderness, or signs of infection (pain, swelling, bleeding, redness, odor or green/yellow  discharge around incision site)      Call MD for:  severe or increased pain, loss or decreased feeling  in affected limb(s)      Discharge wound care:      Comments:   Shower daily with soap and water starting 03/22/12      Discharge Diagnosis:  POPLITEAL ANY  Secondary Diagnosis: Patient Active Problem List  Diagnosis  . CIGARETTE SMOKER  . AAA  . DYSPNEA  . Aftercare following surgery of the circulatory system, NEC  . Peripheral vascular disease, unspecified  . Popliteal artery aneurysm  . Preop cardiovascular exam  . Aneurysm of artery of lower extremity   Past Medical History  Diagnosis Date  . AAA (abdominal aortic aneurysm)   . Smoker   .  Chronic back pain   . Hypertension   . Thyroid disease   . COPD (chronic obstructive pulmonary disease)   . Hypothyroidism   . Depression   . Arthritis     Terry, Boyd  Home Medication Instructions [id number]   Printed on:03/22/12 1521  Medication Information                    LORazepam (ATIVAN) 1 MG tablet Take 1 mg by mouth at bedtime as needed. sleep           sertraline (ZOLOFT) 50 MG tablet Take 50 mg by mouth daily.           levothyroxine (SYNTHROID, LEVOTHROID) 125 MCG tablet Take 125 mcg by mouth daily.           aspirin EC 325 MG tablet Take 1 tablet (325 mg total) by mouth daily.           oxyCODONE (OXY IR/ROXICODONE) 5 MG immediate release tablet Take 1-2 tablets (5-10 mg total) by mouth every 4 (four) hours as needed.             Disposition: home  Patient's condition: is Good  Follow up: 1. Dr. Darrick Penna in 2 weeks   Doreatha Massed, PA-C [facility] [phone] 03/22/2012  3:16 PM

## 2012-04-12 ENCOUNTER — Telehealth: Payer: Self-pay | Admitting: Vascular Surgery

## 2012-04-12 NOTE — Telephone Encounter (Signed)
Patient called to cancel his appointment on 04/13/12 due to the possible snow. He stated that he does not want to risk his life just to hear that he is doing fine. I told the patient to call if he has any problems/questions or concerns prior to his rescheduled appointment. He again went into detail about how well he has been doing and the incision site has healed well.

## 2012-04-13 ENCOUNTER — Ambulatory Visit: Payer: Medicare Other | Admitting: Vascular Surgery

## 2012-04-26 ENCOUNTER — Encounter: Payer: Self-pay | Admitting: Vascular Surgery

## 2012-04-27 ENCOUNTER — Encounter: Payer: Self-pay | Admitting: Vascular Surgery

## 2012-04-27 ENCOUNTER — Ambulatory Visit (INDEPENDENT_AMBULATORY_CARE_PROVIDER_SITE_OTHER): Payer: Medicare Other | Admitting: Vascular Surgery

## 2012-04-27 ENCOUNTER — Ambulatory Visit: Payer: Medicare Other | Admitting: Neurosurgery

## 2012-04-27 ENCOUNTER — Other Ambulatory Visit: Payer: Medicare Other

## 2012-04-27 ENCOUNTER — Ambulatory Visit: Payer: Medicare Other | Admitting: Vascular Surgery

## 2012-04-27 VITALS — BP 139/89 | HR 84 | Ht 69.5 in | Wt 189.0 lb

## 2012-04-27 DIAGNOSIS — I724 Aneurysm of artery of lower extremity: Secondary | ICD-10-CM

## 2012-04-27 DIAGNOSIS — Z48812 Encounter for surgical aftercare following surgery on the circulatory system: Secondary | ICD-10-CM

## 2012-04-27 DIAGNOSIS — I714 Abdominal aortic aneurysm, without rupture: Secondary | ICD-10-CM

## 2012-04-27 NOTE — Progress Notes (Signed)
Patient returns for followup today after recent repair of left popliteal aneurysm 03/20/2012. He has also undergone endovascular aneurysm stent grafting of his abdominal aorta in the past. The patient states that he is walking well with no claudication symptoms. He had some swelling in the left lower extremity initially but this is resolving. He has had no drainage from his wounds.  Past Surgical History  Procedure Laterality Date  . Appendectomy    . Abdominal aortic aneurysm repair  04-22-2009    Stent graft repair of AAA  . Vasectomy  1982  . Endarterectomy popliteal  03/20/2012    Procedure: ENDARTERECTOMY POPLITEAL;  Surgeon: Sherren Kerns, MD;  Location: [facility] OR;  Service: Vascular;  Laterality: Left;   LIGATION OF LEFT LEG POPLITEAL ANEURYSM  . Bypass graft popliteal to popliteal  03/20/2012    Procedure: BYPASS GRAFT POPLITEAL TO POPLITEAL;  Surgeon: Sherren Kerns, MD;  Location: [facility] OR;  Service: Vascular;  Laterality: Left;  ABOVE THE KNEE POPLITEAL ARTERY TO BELOW THE KNEE POPLITEAL ARTERY BYPASS GRAFT USING REVERSE LEFT GREATER SAPHENOUS VEIN   . Intraoperative arteriogram  03/20/2012    Procedure: INTRA OPERATIVE ARTERIOGRAM;  Surgeon: Sherren Kerns, MD;  Location: [facility] OR;  Service: Vascular;  Laterality: Left;  TIMES ONE   Physical exam  Filed Vitals:   04/27/12 0850  BP: 139/89  Pulse: 84  Height: 5' 9.5" (1.765 m)  Weight: 189 lb (85.73 kg)  SpO2: 99%   Left lower extremity: 2+ femoral pulse vaguely palpable popliteal pulse brisk biphasic posterior tibial and dorsalis pedis Doppler all incisions healing  Assessment: Patent lower extremity bypass healing incisions  Plan: Followup bypass graft surveillance in 3 months time. Followup in one year for aortic aneurysm stent graft surveillance  Fabienne Bruns, MD [facility] [place] Office: [phone] Pager: [phone]

## 2012-04-28 NOTE — Addendum Note (Signed)
Addended by: Sharee Pimple on: 04/28/2012 08:28 AM   Modules accepted: Orders

## 2012-07-27 ENCOUNTER — Encounter (INDEPENDENT_AMBULATORY_CARE_PROVIDER_SITE_OTHER): Payer: Medicare Other | Admitting: *Deleted

## 2012-07-27 ENCOUNTER — Ambulatory Visit: Payer: Medicare Other | Admitting: Neurosurgery

## 2012-07-27 DIAGNOSIS — I724 Aneurysm of artery of lower extremity: Secondary | ICD-10-CM

## 2012-07-27 DIAGNOSIS — I739 Peripheral vascular disease, unspecified: Secondary | ICD-10-CM

## 2012-07-27 DIAGNOSIS — Z48812 Encounter for surgical aftercare following surgery on the circulatory system: Secondary | ICD-10-CM

## 2012-07-28 ENCOUNTER — Other Ambulatory Visit: Payer: Self-pay | Admitting: *Deleted

## 2012-07-28 DIAGNOSIS — I739 Peripheral vascular disease, unspecified: Secondary | ICD-10-CM

## 2012-07-28 DIAGNOSIS — Z48812 Encounter for surgical aftercare following surgery on the circulatory system: Secondary | ICD-10-CM

## 2012-07-31 ENCOUNTER — Encounter: Payer: Self-pay | Admitting: Vascular Surgery

## 2013-04-26 ENCOUNTER — Ambulatory Visit: Payer: Medicare Other | Admitting: Neurosurgery

## 2013-04-26 ENCOUNTER — Other Ambulatory Visit (HOSPITAL_COMMUNITY): Payer: Medicare Other

## 2013-04-30 ENCOUNTER — Other Ambulatory Visit: Payer: Self-pay | Admitting: Vascular Surgery

## 2013-04-30 DIAGNOSIS — I739 Peripheral vascular disease, unspecified: Secondary | ICD-10-CM

## 2013-04-30 DIAGNOSIS — Z48812 Encounter for surgical aftercare following surgery on the circulatory system: Secondary | ICD-10-CM

## 2013-05-04 ENCOUNTER — Ambulatory Visit (HOSPITAL_COMMUNITY)
Admission: RE | Admit: 2013-05-04 | Discharge: 2013-05-04 | Disposition: A | Discharge status: Home / Self Care | Payer: Medicare Other | Source: Ambulatory Visit | Attending: Vascular Surgery | Admitting: Vascular Surgery

## 2013-05-04 DIAGNOSIS — Z48812 Encounter for surgical aftercare following surgery on the circulatory system: Secondary | ICD-10-CM

## 2013-05-04 DIAGNOSIS — I714 Abdominal aortic aneurysm, without rupture, unspecified: Secondary | ICD-10-CM | POA: Insufficient documentation

## 2013-05-04 DIAGNOSIS — Z4889 Encounter for other specified surgical aftercare: Secondary | ICD-10-CM | POA: Insufficient documentation

## 2013-05-10 ENCOUNTER — Telehealth (HOSPITAL_COMMUNITY): Payer: Self-pay

## 2013-05-10 NOTE — Telephone Encounter (Signed)
Terry Boyd called regarding the ultrasound that he had in the office on 05/04/13.  I informed him that a letter had been mailed to him today regarding the results and his next appointment.  I also went over the results of the ultrasound with him, telling him that there had been no change in the size of the aneurysm since the last exam was performed.

## 2013-06-05 ENCOUNTER — Other Ambulatory Visit: Payer: Self-pay

## 2013-06-05 DIAGNOSIS — I714 Abdominal aortic aneurysm, without rupture, unspecified: Secondary | ICD-10-CM

## 2013-06-05 DIAGNOSIS — Z48812 Encounter for surgical aftercare following surgery on the circulatory system: Secondary | ICD-10-CM

## 2013-06-06 ENCOUNTER — Encounter: Payer: Self-pay | Admitting: Vascular Surgery

## 2013-11-01 ENCOUNTER — Other Ambulatory Visit (HOSPITAL_COMMUNITY): Payer: Medicare Other

## 2013-11-01 ENCOUNTER — Encounter: Payer: Self-pay | Admitting: Family

## 2013-11-01 ENCOUNTER — Ambulatory Visit: Payer: Medicare Other | Admitting: Family

## 2013-11-02 ENCOUNTER — Ambulatory Visit (INDEPENDENT_AMBULATORY_CARE_PROVIDER_SITE_OTHER): Payer: Medicare Other | Admitting: Family

## 2013-11-02 ENCOUNTER — Ambulatory Visit (HOSPITAL_COMMUNITY)
Admission: RE | Admit: 2013-11-02 | Discharge: 2013-11-02 | Disposition: A | Discharge status: Home / Self Care | Payer: Medicare Other | Source: Ambulatory Visit | Attending: Family | Admitting: Family

## 2013-11-02 ENCOUNTER — Encounter: Payer: Self-pay | Admitting: Family

## 2013-11-02 ENCOUNTER — Ambulatory Visit (INDEPENDENT_AMBULATORY_CARE_PROVIDER_SITE_OTHER)
Admission: RE | Admit: 2013-11-02 | Discharge: 2013-11-02 | Disposition: A | Discharge status: Home / Self Care | Payer: Medicare Other | Source: Ambulatory Visit | Attending: Family | Admitting: Family

## 2013-11-02 VITALS — BP 139/85 | HR 63 | Resp 16 | Ht 70.0 in | Wt 180.0 lb

## 2013-11-02 DIAGNOSIS — R200 Anesthesia of skin: Secondary | ICD-10-CM | POA: Insufficient documentation

## 2013-11-02 DIAGNOSIS — I739 Peripheral vascular disease, unspecified: Secondary | ICD-10-CM | POA: Diagnosis not present

## 2013-11-02 DIAGNOSIS — Z48812 Encounter for surgical aftercare following surgery on the circulatory system: Secondary | ICD-10-CM

## 2013-11-02 DIAGNOSIS — I724 Aneurysm of artery of lower extremity: Secondary | ICD-10-CM

## 2013-11-02 DIAGNOSIS — R29898 Other symptoms and signs involving the musculoskeletal system: Secondary | ICD-10-CM | POA: Insufficient documentation

## 2013-11-02 DIAGNOSIS — R209 Unspecified disturbances of skin sensation: Secondary | ICD-10-CM

## 2013-11-02 NOTE — Patient Instructions (Addendum)
Abdominal Aortic Aneurysm An aneurysm is a weakened or damaged part of an artery wall that bulges from the normal force of blood pumping through the body. An abdominal aortic aneurysm is an aneurysm that occurs in the lower part of the aorta, the main artery of the body.  The major concern with an abdominal aortic aneurysm is that it can enlarge and burst (rupture) or blood can flow between the layers of the wall of the aorta through a tear (aorticdissection). Both of these conditions can cause bleeding inside the body and can be life threatening unless diagnosed and treated promptly. CAUSES  The exact cause of an abdominal aortic aneurysm is unknown. Some contributing factors are:   A hardening of the arteries caused by the buildup of fat and other substances in the lining of a blood vessel (arteriosclerosis).  Inflammation of the walls of an artery (arteritis).   Connective tissue diseases, such as Marfan syndrome.   Abdominal trauma.   An infection, such as syphilis or staphylococcus, in the wall of the aorta (infectious aortitis) caused by bacteria. RISK FACTORS  Risk factors that contribute to an abdominal aortic aneurysm may include:  Age older than 60 years.   High blood pressure (hypertension).  Male gender.  Ethnicity (white race).  Obesity.  Family history of aneurysm (first degree relatives only).  Tobacco use. PREVENTION  The following healthy lifestyle habits may help decrease your risk of abdominal aortic aneurysm:  Quitting smoking. Smoking can raise your blood pressure and cause arteriosclerosis.  Limiting or avoiding alcohol.  Keeping your blood pressure, blood sugar level, and cholesterol levels within normal limits.  Decreasing your salt intake. In somepeople, too much salt can raise blood pressure and increase your risk of abdominal aortic aneurysm.  Eating a diet low in saturated fats and cholesterol.  Increasing your fiber intake by including  whole grains, vegetables, and fruits in your diet. Eating these foods may help lower blood pressure.  Maintaining a healthy weight.  Staying physically active and exercising regularly. SYMPTOMS  The symptoms of abdominal aortic aneurysm may vary depending on the size and rate of growth of the aneurysm.Most grow slowly and do not have any symptoms. When symptoms do occur, they may include:  Pain (abdomen, side, lower back, or groin). The pain may vary in intensity. A sudden onset of severe pain may indicate that the aneurysm has ruptured.  Feeling full after eating only small amounts of food.  Nausea or vomiting or both.  Feeling a pulsating lump in the abdomen.  Feeling faint or passing out. DIAGNOSIS  Since most unruptured abdominal aortic aneurysms have no symptoms, they are often discovered during diagnostic exams for other conditions. An aneurysm may be found during the following procedures:  Ultrasonography (A one-time screening for abdominal aortic aneurysm by ultrasonography is also recommended for all men aged 65-75 years who have ever smoked).  X-ray exams.  A computed tomography (CT).  Magnetic resonance imaging (MRI).  Angiography or arteriography. TREATMENT  Treatment of an abdominal aortic aneurysm depends on the size of your aneurysm, your age, and risk factors for rupture. Medication to control blood pressure and pain may be used to manage aneurysms smaller than 6 cm. Regular monitoring for enlargement may be recommended by your caregiver if:  The aneurysm is 3-4 cm in size (an annual ultrasonography may be recommended).  The aneurysm is 4-4.5 cm in size (an ultrasonography every 6 months may be recommended).  The aneurysm is larger than 4.5 cm in   size (your caregiver may ask that you be examined by a vascular surgeon). If your aneurysm is larger than 6 cm, surgical repair may be recommended. There are two main methods for repair of an aneurysm:   Endovascular  repair (a minimally invasive surgery). This is done most often.  Open repair. This method is used if an endovascular repair is not possible. Document Released: 11/25/2004 Document Revised: 06/12/2012 Document Reviewed: 03/17/2012 [facility] Patient Information 2015 [place], [place]. This information is not intended to replace advice given to you by your health care provider. Make sure you discuss any questions you have with your health care provider.   Peripheral Vascular Disease Peripheral Vascular Disease (PVD), also called Peripheral Arterial Disease (PAD), is a circulation problem caused by cholesterol (atherosclerotic plaque) deposits in the arteries. PVD commonly occurs in the lower extremities (legs) but it can occur in other areas of the body, such as your arms. The cholesterol buildup in the arteries reduces blood flow which can cause pain and other serious problems. The presence of PVD can place a person at risk for Coronary Artery Disease (CAD).  CAUSES  Causes of PVD can be many. It is usually associated with more than one risk factor such as:   High Cholesterol.  Smoking.  Diabetes.  Lack of exercise or inactivity.  High blood pressure (hypertension).  Obesity.  Family history. SYMPTOMS   When the lower extremities are affected, patients with PVD may experience:  Leg pain with exertion or physical activity. This is called INTERMITTENT CLAUDICATION. This may present as cramping or numbness with physical activity. The location of the pain is associated with the level of blockage. For example, blockage at the abdominal level (distal abdominal aorta) may result in buttock or hip pain. Lower leg arterial blockage may result in calf pain.  As PVD becomes more severe, pain can develop with less physical activity.  In people with severe PVD, leg pain may occur at rest.  Other PVD signs and symptoms:  Leg numbness or weakness.  Coldness in the affected leg or foot, especially  when compared to the other leg.  A change in leg color.  Patients with significant PVD are more prone to ulcers or sores on toes, feet or legs. These may take longer to heal or may reoccur. The ulcers or sores can become infected.  If signs and symptoms of PVD are ignored, gangrene may occur. This can result in the loss of toes or loss of an entire limb.  Not all leg pain is related to PVD. Other medical conditions can cause leg pain such as:  Blood clots (embolism) or Deep Vein Thrombosis.  Inflammation of the blood vessels (vasculitis).  Spinal stenosis. DIAGNOSIS  Diagnosis of PVD can involve several different types of tests. These can include:  Pulse Volume Recording Method (PVR). This test is simple, painless and does not involve the use of X-rays. PVR involves measuring and comparing the blood pressure in the arms and legs. An ABI (Ankle-Brachial Index) is calculated. The normal ratio of blood pressures is 1. As this number becomes smaller, it indicates more severe disease.  < 0.95 - indicates significant narrowing in one or more leg vessels.  <0.8 - there will usually be pain in the foot, leg or buttock with exercise.  <0.4 - will usually have pain in the legs at rest.  <0.25 - usually indicates limb threatening PVD.  Doppler detection of pulses in the legs. This test is painless and checks to see if  you have a pulses in your legs/feet.  A dye or contrast material (a substance that highlights the blood vessels so they show up on x-ray) may be given to help your caregiver better see the arteries for the following tests. The dye is eliminated from your body by the kidney's. Your caregiver may order blood work to check your kidney function and other laboratory values before the following tests are performed:  Magnetic Resonance Angiography (MRA). An MRA is a picture study of the blood vessels and arteries. The MRA machine uses a large magnet to produce images of the blood  vessels.  Computed Tomography Angiography (CTA). A CTA is a specialized x-ray that looks at how the blood flows in your blood vessels. An IV may be inserted into your arm so contrast dye can be injected.  Angiogram. Is a procedure that uses x-rays to look at your blood vessels. This procedure is minimally invasive, meaning a small incision (cut) is made in your groin. A small tube (catheter) is then inserted into the artery of your groin. The catheter is guided to the blood vessel or artery your caregiver wants to examine. Contrast dye is injected into the catheter. X-rays are then taken of the blood vessel or artery. After the images are obtained, the catheter is taken out. TREATMENT  Treatment of PVD involves many interventions which may include:  Lifestyle changes:  Quitting smoking.  Exercise.  Following a low fat, low cholesterol diet.  Control of diabetes.  Foot care is very important to the PVD patient. Good foot care can help prevent infection.  Medication:  Cholesterol-lowering medicine.  Blood pressure medicine.  Anti-platelet drugs.  Certain medicines may reduce symptoms of Intermittent Claudication.  Interventional/Surgical options:  Angioplasty. An Angioplasty is a procedure that inflates a balloon in the blocked artery. This opens the blocked artery to improve blood flow.  Stent Implant. A wire mesh tube (stent) is placed in the artery. The stent expands and stays in place, allowing the artery to remain open.  Peripheral Bypass Surgery. This is a surgical procedure that reroutes the blood around a blocked artery to help improve blood flow. This type of procedure may be performed if Angioplasty or stent implants are not an option. SEEK IMMEDIATE MEDICAL CARE IF:   You develop pain or numbness in your arms or legs.  Your arm or leg turns cold, becomes blue in color.  You develop redness, warmth, swelling and pain in your arms or legs. MAKE SURE YOU:    Understand these instructions.  Will watch your condition.  Will get help right away if you are not doing well or get worse. Document Released: 03/25/2004 Document Revised: 05/10/2011 Document Reviewed: 02/20/2008 [facility] Patient Information 2015 [place], [place]. This information is not intended to replace advice given to you by your health care provider. Make sure you discuss any questions you have with your health care provider.   Smoking Cessation Quitting smoking is important to your health and has many advantages. However, it is not always easy to quit since nicotine is a very addictive drug. Oftentimes, people try 3 times or more before being able to quit. This document explains the best ways for you to prepare to quit smoking. Quitting takes hard work and a lot of effort, but you can do it. ADVANTAGES OF QUITTING SMOKING  You will live longer, feel better, and live better.  Your body will feel the impact of quitting smoking almost immediately.  Within 20 minutes, blood pressure decreases.  Your pulse returns to its normal level.  After 8 hours, carbon monoxide levels in the blood return to normal. Your oxygen level increases.  After 24 hours, the chance of having a heart attack starts to decrease. Your breath, hair, and body stop smelling like smoke.  After 48 hours, damaged nerve endings begin to recover. Your sense of taste and smell improve.  After 72 hours, the body is virtually free of nicotine. Your bronchial tubes relax and breathing becomes easier.  After 2 to 12 weeks, lungs can hold more air. Exercise becomes easier and circulation improves.  The risk of having a heart attack, stroke, cancer, or lung disease is greatly reduced.  After 1 year, the risk of coronary heart disease is cut in half.  After 5 years, the risk of stroke falls to the same as a nonsmoker.  After 10 years, the risk of lung cancer is cut in half and the risk of other cancers decreases  significantly.  After 15 years, the risk of coronary heart disease drops, usually to the level of a nonsmoker.  If you are pregnant, quitting smoking will improve your chances of having a healthy baby.  The people you live with, especially any children, will be healthier.  You will have extra money to spend on things other than cigarettes. QUESTIONS TO THINK ABOUT BEFORE ATTEMPTING TO QUIT You may want to talk about your answers with your health care provider.  Why do you want to quit?  If you tried to quit in the past, what helped and what did not?  What will be the most difficult situations for you after you quit? How will you plan to handle them?  Who can help you through the tough times? Your family? Friends? A health care provider?  What pleasures do you get from smoking? What ways can you still get pleasure if you quit? Here are some questions to ask your health care provider:  How can you help me to be successful at quitting?  What medicine do you think would be best for me and how should I take it?  What should I do if I need more help?  What is smoking withdrawal like? How can I get information on withdrawal? GET READY  Set a quit date.  Change your environment by getting rid of all cigarettes, ashtrays, matches, and lighters in your home, car, or work. Do not let people smoke in your home.  Review your past attempts to quit. Think about what worked and what did not. GET SUPPORT AND ENCOURAGEMENT You have a better chance of being successful if you have help. You can get support in many ways.  Tell your family, friends, and coworkers that you are going to quit and need their support. Ask them not to smoke around you.  Get individual, group, or telephone counseling and support. Programs are available at [employer] and health centers. Call your local health department for information about programs in your area.  Spiritual beliefs and practices may help some  smokers quit.  Download a "quit meter" on your computer to keep track of quit statistics, such as how long you have gone without smoking, cigarettes not smoked, and money saved.  Get a self-help book about quitting smoking and staying off tobacco. [place] yourself from urges to smoke. Talk to someone, go for a walk, or occupy your time with a task.  Change your normal routine. Take a different route to work.  Drink tea instead of coffee. Eat breakfast in a different place.  Reduce your stress. Take a hot bath, exercise, or read a book.  Plan something enjoyable to do every day. Reward yourself for not smoking.  Explore interactive web-based programs that specialize in helping you quit. GET MEDICINE AND USE IT CORRECTLY Medicines can help you stop smoking and decrease the urge to smoke. Combining medicine with the above behavioral methods and support can greatly increase your chances of successfully quitting smoking.  Nicotine replacement therapy helps deliver nicotine to your body without the negative effects and risks of smoking. Nicotine replacement therapy includes nicotine gum, lozenges, inhalers, nasal sprays, and skin patches. Some may be available over-the-counter and others require a prescription.  Antidepressant medicine helps people abstain from smoking, but how this works is unknown. This medicine is available by prescription.  Nicotinic receptor partial agonist medicine simulates the effect of nicotine in your brain. This medicine is available by prescription. Ask your health care provider for advice about which medicines to use and how to use them based on your health history. Your health care provider will tell you what side effects to look out for if you choose to be on a medicine or therapy. Carefully read the information on the package. Do not use any other product containing nicotine while using a nicotine replacement product.  RELAPSE OR  DIFFICULT SITUATIONS Most relapses occur within the first 3 months after quitting. Do not be discouraged if you start smoking again. Remember, most people try several times before finally quitting. You may have symptoms of withdrawal because your body is used to nicotine. You may crave cigarettes, be irritable, feel very hungry, cough often, get headaches, or have difficulty concentrating. The withdrawal symptoms are only temporary. They are strongest when you first quit, but they will go away within 10-14 days. To reduce the chances of relapse, try to:  Avoid drinking alcohol. Drinking lowers your chances of successfully quitting.  Reduce the amount of caffeine you consume. Once you quit smoking, the amount of caffeine in your body increases and can give you symptoms, such as a rapid heartbeat, sweating, and anxiety.  Avoid smokers because they can make you want to smoke.  Do not let weight gain distract you. Many smokers will gain weight when they quit, usually less than 10 pounds. Eat a healthy diet and stay active. You can always lose the weight gained after you quit.  Find ways to improve your mood other than smoking. FOR MORE INFORMATION  www.smokefree.gov  Document Released: 02/09/2001 Document Revised: 07/02/2013 Document Reviewed: 05/27/2011 [facility] Patient Information 2015 [place], [place]. This information is not intended to replace advice given to you by your health care provider. Make sure you discuss any questions you have with your health care provider.

## 2013-11-02 NOTE — Addendum Note (Signed)
Addended by: Mena Goes on: 11/02/2013 04:58 PM   Modules accepted: Orders

## 2013-11-02 NOTE — Progress Notes (Signed)
[facility] [place] HISTORY AND PHYSICAL   MRN : [id number]  History of Present Illness:   Terry Boyd is a 73 y.o. male patient of Dr. Oneida Alar returns for followup today s/p repair of left popliteal aneurysm on 03/20/2012. He has also undergone endovascular aneurysm stent grafting of his abdominal aorta on 04/22/09. About 40 years ago he was told that he had a small fracture in one of his lumbar vertabra and that the spine is "closing up on it". His legs generally "give out" after walking about 100 yards on a slight incline, denies non healing wounds. He has had extensive evaluation of mid epigastric abdominal pain only with deep palpation, states no abnormalities found. He denies back pain.  Pt denies any history of stroke or TIA, denies any cardiac problems. Pt Diabetic: No Pt smoker: smoker  (1 ppd, started at age 25 yrs)  Pt meds include: Statin :No, states his cholesterol is good ASA: Yes Other anticoagulants/antiplatelets: no  Current Outpatient Prescriptions  Medication Sig Dispense Refill  . aspirin EC 325 MG tablet Take 1 tablet (325 mg total) by mouth daily.  100 tablet  3  . levothyroxine (SYNTHROID, LEVOTHROID) 125 MCG tablet Take 125 mcg by mouth daily.      Marland Kitchen LORazepam (ATIVAN) 1 MG tablet Take 1 mg by mouth at bedtime as needed. sleep      . oxyCODONE (OXY IR/ROXICODONE) 5 MG immediate release tablet Take 1-2 tablets (5-10 mg total) by mouth every 4 (four) hours as needed.  30 tablet  0  . sertraline (ZOLOFT) 50 MG tablet Take 50 mg by mouth daily.       No current facility-administered medications for this visit.    Past Medical History  Diagnosis Date  . AAA (abdominal aortic aneurysm)   . Smoker   . Chronic back pain   . Hypertension   . Thyroid disease   . COPD (chronic obstructive pulmonary disease)   . Hypothyroidism   . Depression   . Arthritis     Social History History  Substance Use Topics  . Smoking status: Current  Every Day Smoker -- 57 years    Types: Cigars  . Smokeless tobacco: Never Used     Comment: pt states he smokes about 20 cigars per day  . Alcohol Use: No    Family History Family History  Problem Relation Age of Onset  . Diabetes Mother   . Heart disease Father   . Diabetes Father   . Heart disease Sister     CABG x 4  . Cancer Brother     throat cancer    Surgical History Past Surgical History  Procedure Laterality Date  . Appendectomy    . Abdominal aortic aneurysm repair  04-22-2009    Stent graft repair of AAA  . Vasectomy  1982  . Endarterectomy popliteal  03/20/2012    Procedure: ENDARTERECTOMY POPLITEAL;  Surgeon: Elam Dutch, MD;  Location: [facility] OR;  Service: Vascular;  Laterality: Left;   LIGATION OF LEFT LEG POPLITEAL ANEURYSM  . Bypass graft popliteal to popliteal  03/20/2012    Procedure: BYPASS GRAFT POPLITEAL TO POPLITEAL;  Surgeon: Elam Dutch, MD;  Location: [facility] OR;  Service: Vascular;  Laterality: Left;  ABOVE THE KNEE POPLITEAL ARTERY TO BELOW THE KNEE POPLITEAL ARTERY BYPASS GRAFT USING REVERSE LEFT GREATER SAPHENOUS VEIN   . Intraoperative arteriogram  03/20/2012    Procedure: INTRA OPERATIVE ARTERIOGRAM;  Surgeon: Elam Dutch, MD;  Location: [facility] OR;  Service: Vascular;  Laterality: Left;  TIMES ONE    Allergies  Allergen Reactions  . Morphine And Related     hallucinations    Current Outpatient Prescriptions  Medication Sig Dispense Refill  . aspirin EC 325 MG tablet Take 1 tablet (325 mg total) by mouth daily.  100 tablet  3  . levothyroxine (SYNTHROID, LEVOTHROID) 125 MCG tablet Take 125 mcg by mouth daily.      Marland Kitchen LORazepam (ATIVAN) 1 MG tablet Take 1 mg by mouth at bedtime as needed. sleep      . oxyCODONE (OXY IR/ROXICODONE) 5 MG immediate release tablet Take 1-2 tablets (5-10 mg total) by mouth every 4 (four) hours as needed.  30 tablet  0  . sertraline (ZOLOFT) 50 MG tablet Take 50 mg by mouth daily.       No current  facility-administered medications for this visit.     REVIEW OF SYSTEMS: See HPI for pertinent positives and negatives.  Physical Examination There were no vitals filed for this visit. There is no weight on file to calculate BMI.  General:  WDWN in NAD Gait: Normal HENT: WNL Eyes: Pupils equal Pulmonary: normal non-labored breathing , without Rales, rhonchi,  Wheezing, decreased air movement in all fields, occasional dry cough. Cardiac: RRR, no murmurs detected  Abdomen: soft, NT, no masses palpated Skin: no rashes, ulcers noted;  no Gangrene , no cellulitis; no open wounds;   VASCULAR EXAM  Carotid Bruits Right Left   Negative Negative                             VASCULAR EXAM: Extremities without ischemic changes  without Gangrene; without open wounds.                                                                                                          LE Pulses Right Left       FEMORAL  not palpable  2+ palpable        POPLITEAL  2+ palpable   not palpable       POSTERIOR TIBIAL  2+ palpable   2+ palpable        DORSALIS PEDIS      ANTERIOR TIBIAL not palpable  not palpable     Musculoskeletal: no muscle wasting or atrophy; no edema  Neurologic: A&O X 3; Appropriate Affect ;  SENSATION: normal; MOTOR FUNCTION: 5/5 Symmetric, CN 2-12 intact Speech is fluent/normal    Non-Invasive Vascular Imaging (11/02/2013): ABI's: Right: 1.15, Left: 1.13, all triphasic waveforms Previous (07/27/12) ABI's: Right: 1.06, Left: 1.10  Left LE arterial Duplex: Widely patent graft with a dilatation proximal to graft which has increased in size compared to last year which was 1.38 cm, is 1.56 cm today.     ASSESSMENT:  MERLON Boyd is a 73 y.o. male who is s/p repair of left popliteal aneurysm on 03/20/2012. He has also undergone endovascular aneurysm stent grafting of his abdominal aorta on 04/22/09. He has  mild claudication weakness in both legs with no tissue loss  and normal ABI's. Left popliteal bypass graft is widely patent with a dilatation proximal to graft which has increased in size compared to last year which was 1.38 cm, is 1.56 cm today. Unfortunately he continues to smoke but does not have DM.  PLAN:   He was counseled re smoking cessation. Based on today's exam and non-invasive vascular lab results, and after discussing with Dr. Trula Slade, the patient will follow up in 6 months with the following tests EVAR Duplex, ABI's, left LE arterial Duplex. I discussed in depth with the patient the nature of atherosclerosis, and emphasized the importance of maximal medical management including strict control of blood pressure, blood glucose, and lipid levels, obtaining regular exercise, and cessation of smoking.  The patient is aware that without maximal medical management the underlying atherosclerotic disease process will progress, limiting the benefit of any interventions. Consideration for repair of AAA would be made when the size in 5.5 cm, growth > 1 cm/yr, and symptomatic status.  The patient was given information about AAA including signs, symptoms, treatment,  what symptoms should prompt the patient to seek immediate medical care, and how to minimize the risk of enlargement and rupture of aneurysms. The patient was given information about PAD including signs, symptoms, treatment, what symptoms should prompt the patient to seek immediate medical care, and risk reduction measures to take. Thank you for allowing [place] to participate in this patient's care.  Clemon Chambers, RN, MSN, FNP-C [facility] Office: [phone]  Clinic MD: Trula Slade  11/02/2013 11:00 AM

## 2014-01-17 ENCOUNTER — Ambulatory Visit: Payer: Medicare Other | Admitting: Vascular Surgery

## 2014-01-17 ENCOUNTER — Encounter (HOSPITAL_COMMUNITY): Payer: Medicare Other

## 2014-01-17 ENCOUNTER — Other Ambulatory Visit (HOSPITAL_COMMUNITY): Payer: Medicare Other

## 2014-02-07 ENCOUNTER — Encounter (HOSPITAL_COMMUNITY): Payer: Self-pay | Admitting: Surgery

## 2014-03-04 DIAGNOSIS — E039 Hypothyroidism, unspecified: Secondary | ICD-10-CM | POA: Diagnosis not present

## 2014-03-04 DIAGNOSIS — F419 Anxiety disorder, unspecified: Secondary | ICD-10-CM | POA: Diagnosis not present

## 2014-03-04 DIAGNOSIS — I7389 Other specified peripheral vascular diseases: Secondary | ICD-10-CM | POA: Diagnosis not present

## 2014-03-11 DIAGNOSIS — Z1389 Encounter for screening for other disorder: Secondary | ICD-10-CM | POA: Diagnosis not present

## 2014-03-11 DIAGNOSIS — E039 Hypothyroidism, unspecified: Secondary | ICD-10-CM | POA: Diagnosis not present

## 2014-03-11 DIAGNOSIS — Z716 Tobacco abuse counseling: Secondary | ICD-10-CM | POA: Diagnosis not present

## 2014-03-11 DIAGNOSIS — F419 Anxiety disorder, unspecified: Secondary | ICD-10-CM | POA: Diagnosis not present

## 2014-03-11 DIAGNOSIS — F328 Other depressive episodes: Secondary | ICD-10-CM | POA: Diagnosis not present

## 2014-03-11 DIAGNOSIS — E782 Mixed hyperlipidemia: Secondary | ICD-10-CM | POA: Diagnosis not present

## 2014-05-09 ENCOUNTER — Other Ambulatory Visit (HOSPITAL_COMMUNITY): Payer: Medicare Other

## 2014-05-09 ENCOUNTER — Ambulatory Visit: Payer: Medicare Other | Admitting: Family

## 2014-05-09 ENCOUNTER — Encounter (HOSPITAL_COMMUNITY): Payer: Medicare Other

## 2014-05-17 ENCOUNTER — Ambulatory Visit: Payer: Medicare Other | Admitting: Family

## 2014-05-17 ENCOUNTER — Other Ambulatory Visit (HOSPITAL_COMMUNITY): Payer: Medicare Other

## 2014-05-17 ENCOUNTER — Encounter (HOSPITAL_COMMUNITY): Payer: Medicare Other

## 2014-05-30 ENCOUNTER — Encounter: Payer: Self-pay | Admitting: Family

## 2014-05-31 ENCOUNTER — Ambulatory Visit (INDEPENDENT_AMBULATORY_CARE_PROVIDER_SITE_OTHER)
Admission: RE | Admit: 2014-05-31 | Discharge: 2014-05-31 | Disposition: A | Discharge status: Home / Self Care | Payer: Medicare Other | Source: Ambulatory Visit | Attending: Family | Admitting: Family

## 2014-05-31 ENCOUNTER — Ambulatory Visit (INDEPENDENT_AMBULATORY_CARE_PROVIDER_SITE_OTHER): Payer: Medicare Other | Admitting: Family

## 2014-05-31 ENCOUNTER — Ambulatory Visit (HOSPITAL_COMMUNITY)
Admission: RE | Admit: 2014-05-31 | Discharge: 2014-05-31 | Disposition: A | Discharge status: Home / Self Care | Payer: Medicare Other | Source: Ambulatory Visit | Attending: Family | Admitting: Family

## 2014-05-31 ENCOUNTER — Encounter: Payer: Self-pay | Admitting: Family

## 2014-05-31 VITALS — BP 120/73 | HR 72 | Resp 16 | Ht 70.0 in | Wt 188.0 lb

## 2014-05-31 DIAGNOSIS — I724 Aneurysm of artery of lower extremity: Secondary | ICD-10-CM | POA: Diagnosis not present

## 2014-05-31 DIAGNOSIS — Z95828 Presence of other vascular implants and grafts: Secondary | ICD-10-CM | POA: Diagnosis not present

## 2014-05-31 DIAGNOSIS — Z48812 Encounter for surgical aftercare following surgery on the circulatory system: Secondary | ICD-10-CM

## 2014-05-31 DIAGNOSIS — Z720 Tobacco use: Secondary | ICD-10-CM | POA: Diagnosis not present

## 2014-05-31 DIAGNOSIS — E785 Hyperlipidemia, unspecified: Secondary | ICD-10-CM | POA: Diagnosis not present

## 2014-05-31 DIAGNOSIS — I714 Abdominal aortic aneurysm, without rupture, unspecified: Secondary | ICD-10-CM

## 2014-05-31 DIAGNOSIS — Z9889 Other specified postprocedural states: Secondary | ICD-10-CM | POA: Diagnosis not present

## 2014-05-31 DIAGNOSIS — Z789 Other specified health status: Secondary | ICD-10-CM | POA: Insufficient documentation

## 2014-05-31 DIAGNOSIS — F17200 Nicotine dependence, unspecified, uncomplicated: Secondary | ICD-10-CM

## 2014-05-31 NOTE — Patient Instructions (Signed)
Smoking Cessation  Quitting smoking is important to your health and has many advantages. However, it is not always easy to quit since nicotine is a very addictive drug. Oftentimes, people try 3 times or more before being able to quit. This document explains the best ways for you to prepare to quit smoking. Quitting takes hard work and a lot of effort, but you can do it.  ADVANTAGES OF QUITTING SMOKING  · You will live longer, feel better, and live better.  · Your body will feel the impact of quitting smoking almost immediately.  · Within 20 minutes, blood pressure decreases. Your pulse returns to its normal level.  · After 8 hours, carbon monoxide levels in the blood return to normal. Your oxygen level increases.  · After 24 hours, the chance of having a heart attack starts to decrease. Your breath, hair, and body stop smelling like smoke.  · After 48 hours, damaged nerve endings begin to recover. Your sense of taste and smell improve.  · After 72 hours, the body is virtually free of nicotine. Your bronchial tubes relax and breathing becomes easier.  · After 2 to 12 weeks, lungs can hold more air. Exercise becomes easier and circulation improves.  · The risk of having a heart attack, stroke, cancer, or lung disease is greatly reduced.  · After 1 year, the risk of coronary heart disease is cut in half.  · After 5 years, the risk of stroke falls to the same as a nonsmoker.  · After 10 years, the risk of lung cancer is cut in half and the risk of other cancers decreases significantly.  · After 15 years, the risk of coronary heart disease drops, usually to the level of a nonsmoker.  · If you are pregnant, quitting smoking will improve your chances of having a healthy baby.  · The people you live with, especially any children, will be healthier.  · You will have extra money to spend on things other than cigarettes.  QUESTIONS TO THINK ABOUT BEFORE ATTEMPTING TO QUIT  You may want to talk about your answers with your  health care provider.  · Why do you want to quit?  · If you tried to quit in the past, what helped and what did not?  · What will be the most difficult situations for you after you quit? How will you plan to handle them?  · Who can help you through the tough times? Your family? Friends? A health care provider?  · What pleasures do you get from smoking? What ways can you still get pleasure if you quit?  Here are some questions to ask your health care provider:  · How can you help me to be successful at quitting?  · What medicine do you think would be best for me and how should I take it?  · What should I do if I need more help?  · What is smoking withdrawal like? How can I get information on withdrawal?  GET READY  · Set a quit date.  · Change your environment by getting rid of all cigarettes, ashtrays, matches, and lighters in your home, car, or work. Do not let people smoke in your home.  · Review your past attempts to quit. Think about what worked and what did not.  GET SUPPORT AND ENCOURAGEMENT  You have a better chance of being successful if you have help. You can get support in many ways.  · Tell your family, friends, and   coworkers that you are going to quit and need their support. Ask them not to smoke around you.  · Get individual, group, or telephone counseling and support. Programs are available at local hospitals and health centers. Call your local health department for information about programs in your area.  · Spiritual beliefs and practices may help some smokers quit.  · Download a "quit meter" on your computer to keep track of quit statistics, such as how long you have gone without smoking, cigarettes not smoked, and money saved.  · Get a self-help book about quitting smoking and staying off tobacco.  LEARN NEW SKILLS AND BEHAVIORS  · Distract yourself from urges to smoke. Talk to someone, go for a walk, or occupy your time with a task.  · Change your normal routine. Take a different route to work.  Drink tea instead of coffee. Eat breakfast in a different place.  · Reduce your stress. Take a hot bath, exercise, or read a book.  · Plan something enjoyable to do every day. Reward yourself for not smoking.  · Explore interactive web-based programs that specialize in helping you quit.  GET MEDICINE AND USE IT CORRECTLY  Medicines can help you stop smoking and decrease the urge to smoke. Combining medicine with the above behavioral methods and support can greatly increase your chances of successfully quitting smoking.  · Nicotine replacement therapy helps deliver nicotine to your body without the negative effects and risks of smoking. Nicotine replacement therapy includes nicotine gum, lozenges, inhalers, nasal sprays, and skin patches. Some may be available over-the-counter and others require a prescription.  · Antidepressant medicine helps people abstain from smoking, but how this works is unknown. This medicine is available by prescription.  · Nicotinic receptor partial agonist medicine simulates the effect of nicotine in your brain. This medicine is available by prescription.  Ask your health care provider for advice about which medicines to use and how to use them based on your health history. Your health care provider will tell you what side effects to look out for if you choose to be on a medicine or therapy. Carefully read the information on the package. Do not use any other product containing nicotine while using a nicotine replacement product.   RELAPSE OR DIFFICULT SITUATIONS  Most relapses occur within the first 3 months after quitting. Do not be discouraged if you start smoking again. Remember, most people try several times before finally quitting. You may have symptoms of withdrawal because your body is used to nicotine. You may crave cigarettes, be irritable, feel very hungry, cough often, get headaches, or have difficulty concentrating. The withdrawal symptoms are only temporary. They are strongest  when you first quit, but they will go away within 10-14 days.  To reduce the chances of relapse, try to:  · Avoid drinking alcohol. Drinking lowers your chances of successfully quitting.  · Reduce the amount of caffeine you consume. Once you quit smoking, the amount of caffeine in your body increases and can give you symptoms, such as a rapid heartbeat, sweating, and anxiety.  · Avoid smokers because they can make you want to smoke.  · Do not let weight gain distract you. Many smokers will gain weight when they quit, usually less than 10 pounds. Eat a healthy diet and stay active. You can always lose the weight gained after you quit.  · Find ways to improve your mood other than smoking.  FOR MORE INFORMATION   www.smokefree.gov   Document Released:   02/09/2001 Document Revised: 07/02/2013 Document Reviewed: 05/27/2011  ExitCare® Patient Information ©2015 [employer]. This information is not intended to replace advice given to you by your health care provider. Make sure you discuss any questions you have with your health care provider.  Smoking Cessation, Tips for Success  If you are ready to quit smoking, congratulations! You have chosen to help yourself be healthier. Cigarettes bring nicotine, tar, carbon monoxide, and other irritants into your body. Your lungs, heart, and blood vessels will be able to work better without these poisons. There are many different ways to quit smoking. Nicotine gum, nicotine patches, a nicotine inhaler, or nicotine nasal spray can help with physical craving. Hypnosis, support groups, and medicines help break the habit of smoking.  WHAT THINGS CAN I DO TO MAKE QUITTING EASIER?   Here are some tips to help you quit for good:  · Pick a date when you will quit smoking completely. Tell all of your friends and family about your plan to quit on that date.  · Do not try to slowly cut down on the number of cigarettes you are smoking. Pick a quit date and quit smoking completely starting on  that day.  · Throw away all cigarettes.    · Clean and remove all ashtrays from your home, work, and car.  · On a card, write down your reasons for quitting. Carry the card with you and read it when you get the urge to smoke.  · Cleanse your body of nicotine. Drink enough water and fluids to keep your urine clear or pale yellow. Do this after quitting to flush the nicotine from your body.  · Learn to predict your moods. Do not let a bad situation be your excuse to have a cigarette. Some situations in your life might tempt you into wanting a cigarette.  · Never have "just one" cigarette. It leads to wanting another and another. Remind yourself of your decision to quit.  · Change habits associated with smoking. If you smoked while driving or when feeling stressed, try other activities to replace smoking. Stand up when drinking your coffee. Brush your teeth after eating. Sit in a different chair when you read the paper. Avoid alcohol while trying to quit, and try to drink fewer caffeinated beverages. Alcohol and caffeine may urge you to smoke.  · Avoid foods and drinks that can trigger a desire to smoke, such as sugary or spicy foods and alcohol.  · Ask people who smoke not to smoke around you.  · Have something planned to do right after eating or having a cup of coffee. For example, plan to take a walk or exercise.  · Try a relaxation exercise to calm you down and decrease your stress. Remember, you may be tense and nervous for the first 2 weeks after you quit, but this will pass.  · Find new activities to keep your hands busy. Play with a pen, coin, or rubber band. Doodle or draw things on paper.  · Brush your teeth right after eating. This will help cut down on the craving for the taste of tobacco after meals. You can also try mouthwash.    · Use oral substitutes in place of cigarettes. Try using lemon drops, carrots, cinnamon sticks, or chewing gum. Keep them handy so they are available when you have the urge to  smoke.  · When you have the urge to smoke, try deep breathing.  · Designate your home as a nonsmoking area.  ·   If you are a heavy smoker, ask your health care provider about a prescription for nicotine chewing gum. It can ease your withdrawal from nicotine.  · Reward yourself. Set aside the cigarette money you save and buy yourself something nice.  · Look for support from others. Join a support group or smoking cessation program. Ask someone at home or at work to help you with your plan to quit smoking.  · Always ask yourself, "Do I need this cigarette or is this just a reflex?" Tell yourself, "Today, I choose not to smoke," or "I do not want to smoke." You are reminding yourself of your decision to quit.  · Do not replace cigarette smoking with electronic cigarettes (commonly called e-cigarettes). The safety of e-cigarettes is unknown, and some may contain harmful chemicals.  · If you relapse, do not give up! Plan ahead and think about what you will do the next time you get the urge to smoke.  HOW WILL I FEEL WHEN I QUIT SMOKING?  You may have symptoms of withdrawal because your body is used to nicotine (the addictive substance in cigarettes). You may crave cigarettes, be irritable, feel very hungry, cough often, get headaches, or have difficulty concentrating. The withdrawal symptoms are only temporary. They are strongest when you first quit but will go away within 10-14 days. When withdrawal symptoms occur, stay in control. Think about your reasons for quitting. Remind yourself that these are signs that your body is healing and getting used to being without cigarettes. Remember that withdrawal symptoms are easier to treat than the major diseases that smoking can cause.   Even after the withdrawal is over, expect periodic urges to smoke. However, these cravings are generally short lived and will go away whether you smoke or not. Do not smoke!  WHAT RESOURCES ARE AVAILABLE TO HELP ME QUIT SMOKING?  Your health care  provider can direct you to community resources or hospitals for support, which may include:  · Group support.  · Education.  · Hypnosis.  · Therapy.  Document Released: 11/14/2003 Document Revised: 07/02/2013 Document Reviewed: 08/03/2012  ExitCare® Patient Information ©2015 [employer]. This information is not intended to replace advice given to you by your health care provider. Make sure you discuss any questions you have with your health care provider.

## 2014-05-31 NOTE — Progress Notes (Signed)
[facility] [place]  Established EVAR  History of Present Illness  Terry Boyd is a 74 y.o. ([birth year]) male patient of Dr. Oneida Alar returns for followup today s/p repair of left popliteal aneurysm on 03/20/2012. He has also undergone endovascular aneurysm stent grafting of his abdominal aorta on 04/22/09. About 40 years ago he was told that he had a small fracture in one of his lumbar vertabra and that the spine is "closing up on it". He denies claudication symptoms with walking, denies non healing wounds He has had extensive evaluation of mid epigastric abdominal pain only with deep palpation, states no abnormalities found. He denies back pain.  Pt denies any history of stroke or TIA, denies any cardiac problems.  Pt Diabetic: No Pt smoker: smoker (1 ppd, started at age 73 yrs)  Pt meds include: Statin : yes ASA: Yes Other anticoagulants/antiplatelets: no    Past Medical History  Diagnosis Date  . AAA (abdominal aortic aneurysm)   . Smoker   . Chronic back pain   . Hypertension   . Thyroid disease   . COPD (chronic obstructive pulmonary disease)   . Hypothyroidism   . Depression   . Arthritis    Past Surgical History  Procedure Laterality Date  . Appendectomy    . Abdominal aortic aneurysm repair  04-22-2009    Stent graft repair of AAA  . Vasectomy  1982  . Endarterectomy popliteal  03/20/2012    Procedure: ENDARTERECTOMY POPLITEAL;  Surgeon: Elam Dutch, MD;  Location: [facility] OR;  Service: Vascular;  Laterality: Left;   LIGATION OF LEFT LEG POPLITEAL ANEURYSM  . Bypass graft popliteal to popliteal  03/20/2012    Procedure: BYPASS GRAFT POPLITEAL TO POPLITEAL;  Surgeon: Elam Dutch, MD;  Location: [facility] OR;  Service: Vascular;  Laterality: Left;  ABOVE THE KNEE POPLITEAL ARTERY TO BELOW THE KNEE POPLITEAL ARTERY BYPASS GRAFT USING REVERSE LEFT GREATER SAPHENOUS VEIN   . Intraoperative arteriogram  03/20/2012    Procedure: INTRA OPERATIVE  ARTERIOGRAM;  Surgeon: Elam Dutch, MD;  Location: [place];  Service: Vascular;  Laterality: Left;  TIMES ONE  . Abdominal aortagram N/A 03/14/2012    Procedure: ABDOMINAL Maxcine Ham;  Surgeon: Serafina Mitchell, MD;  Location: [facility] CATH LAB;  Service: Cardiovascular;  Laterality: N/A;   Social History History  Substance Use Topics  . Smoking status: Current Every Day Smoker -- 57 years    Types: Cigars  . Smokeless tobacco: Never Used     Comment: pt states he smokes about 20 cigars per day  . Alcohol Use: No   Family History Family History  Problem Relation Age of Onset  . Diabetes Mother   . Heart disease Father   . Diabetes Father   . Heart disease Sister     CABG x 4  . Cancer Brother     throat cancer   Current Outpatient Prescriptions on File Prior to Visit  Medication Sig Dispense Refill  . aspirin EC 325 MG tablet Take 1 tablet (325 mg total) by mouth daily. 100 tablet 3  . LORazepam (ATIVAN) 1 MG tablet Take 1 mg by mouth at bedtime as needed. sleep    . sertraline (ZOLOFT) 50 MG tablet Take 50 mg by mouth daily.    Marland Kitchen levothyroxine (SYNTHROID, LEVOTHROID) 125 MCG tablet Take 150 mcg by mouth daily.     Marland Kitchen oxyCODONE (OXY IR/ROXICODONE) 5 MG immediate release tablet Take 1-2 tablets (5-10 mg total) by mouth every  4 (four) hours as needed. (Patient not taking: Reported on 05/31/2014) 30 tablet 0   No current facility-administered medications on file prior to visit.   Allergies  Allergen Reactions  . Morphine And Related Shortness Of Breath and Other (See Comments)    hallucinations     ROS: See HPI for pertinent positives and negatives.  Physical Examination  Filed Vitals:   05/31/14 1047  BP: 120/73  Pulse: 72  Resp: 16  Height: 5\' 10"  (1.778 m)  Weight: 188 lb (85.276 kg)  SpO2: 96%   Body mass index is 26.98 kg/(m^2).  General: WDWN in NAD Gait: Normal HENT: WNL Eyes: Pupils equal Pulmonary: normal non-labored breathing , without Rales, rhonchi,+  wheezing, decreased air movement in all fields, occasional dry cough. Cardiac: RRR, no murmurs detected  Abdomen: soft, NT, no masses palpated Skin: no rashes, no ulcers; no Gangrene , no cellulitis; no open wounds.   VASCULAR EXAM  Carotid Bruits Right Left   Negative Negative  radial pulses are 2+ palpable and = Aorta is not palpable   VASCULAR EXAM: Extremities without ischemic changes  without Gangrene; without open wounds.     LE Pulses Right Left   FEMORAL 2+ palpable 2+ palpable    POPLITEAL not palpable  not palpable   POSTERIOR TIBIAL not palpable  not palpable    DORSALIS PEDIS  ANTERIOR TIBIAL 2+ palpable  2+ palpable     Musculoskeletal: no muscle wasting or atrophy; no LE edema Neurologic: A&O X 3; Appropriate Affect ;  SENSATION: normal; MOTOR FUNCTION: 5/5 Symmetric, CN 2-12 intact Speech is fluent/normal        Non-Invasive Vascular Imaging  EVAR Duplex (Date: 05/31/2014) ABDOMINAL AORTA DUPLEX EVALUATION - POST ENDOVASCULAR REPAIR    INDICATION: Abdominal aortic aneurysm    PREVIOUS INTERVENTION(S): Endovascular Aortic Repair performed 04/22/2009.    DUPLEX EXAM:      DIAMETER AP (cm) DIAMETER TRANSVERSE (cm) VELOCITIES (cm/sec)  Aorta 3.33 3.28 106/175  Right Common Iliac 1.14 1.25 68  Left Common Iliac 1.31 1.40 69    Comparison Study       Date DIAMETER AP (cm) DIAMETER TRANSVERSE (cm)  05/04/2013 3.4 3.3     ADDITIONAL FINDINGS:     IMPRESSION: Patent Endovascular Aortic Stent, no hemodynamically significant stenosis present.    Compared to the previous exam:  Stable minimally decreased abdominal aortic sac since previous study on 05/04/2013.    LOWER EXTREMITY ARTERIAL DUPLEX EVALUATION    INDICATION: Left lower  extremity popliteal aneurysm; Abdominal aortic aneurysm    PREVIOUS INTERVENTION(S): Left above knee to below knee popliteal graft 03/20/2012.    DUPLEX EXAM:     RIGHT  LEFT   Peak Systolic Velocity (cm/s) Ratio (if abnormal) Waveform  Peak Systolic Velocity (cm/s) Ratio (if abnormal) Waveform     Inflow Artery 71  T     Proximal Anastomosis 34/49  B/B     Proximal Graft 45  B     Mid Graft 78  B      Distal Graft 67  B     Distal Anastomosis 70  T     Outflow Artery 93/36/55  T/T/T  1.07/0.72 Today's ABI / TBI 1.07/0.60  1.15/0.58 Previous ABI / TBI (11/02/2013 ) 1.13/0.63    Waveform:    M - Monophasic       B - Biphasic       T - Triphasic  If Ankle Brachial Index (ABI) or Toe Brachial Index (TBI) performed,  please see complete report  ADDITIONAL FINDINGS:     IMPRESSION: Patent left above knee to below knee popliteal bypass graft. Proximal anastomotic aneurysm with non-occluding intramural thrombus present measuring 1.60cm AP x 1.78cm TRV.    Compared to the previous exam:  Stable bilateral ankle brachial indices since study on 11/02/2013.     Medical Decision Making  SUHAIL PELOQUIN is a 74 y.o. male who is s/p repair of left popliteal aneurysm on 03/20/2012. He has also undergone endovascular aneurysm stent grafting of his abdominal aorta on 04/22/09. He has no back or abdominal pain, no claudication symptoms.  Today's EVAR Duplex reveals a patent Endovascular Aortic Stent, no hemodynamically significant stenosis present. Stable minimally decreased abdominal aortic sac since previous study on 05/04/2013.  Today's left LE arterial Duplex reveals a patent left above knee to below knee popliteal bypass graft. Proximal anastomotic aneurysm with non-occluding intramural thrombus present measuring 1.60cm AP x 1.78cm TR. Stable and normal bilateral ankle brachial indices with triphasic waveforms throughout since study on 11/02/2013.    I discussed with the patient the  importance of surveillance of the endograft.  The next endograft duplex will be scheduled for 12 months.  Will also check ABI's and left LE arterial Duplexin a year.  The patient will follow up with [place] in 12 months with these studies.  I emphasized the importance of maximal medical management including strict control of blood pressure, blood glucose, and lipid levels, antiplatelet agents, obtaining regular exercise, and cessation of smoking.    The patient was counseled re smoking cessation and given several free resources re smoking cessation.   The patient was given information about AAA including signs, symptoms, treatment, and how to minimize the risk of enlargement and rupture of aneurysms.    Thank you for allowing [place] to participate in this patient's care.  Clemon Chambers, RN, MSN, FNP-C [facility] [place] Office: [phone]  Clinic Physician: Early on call  05/31/2014, 11:15 AM

## 2014-06-03 ENCOUNTER — Telehealth: Payer: Self-pay | Admitting: Family

## 2014-06-03 NOTE — Telephone Encounter (Signed)
-----   Message from Viann Fish, NP sent at 05/31/2014  7:58 PM EDT ----- Regarding: add  left LE arterial Duplex [place] and [facility] scheduling,  Please add  left LE arterial Duplex to EVAR Duplex and ABI's for pt return in a year. Please let pt know of any time or date changes. Thank you, Vinnie Level

## 2014-06-03 NOTE — Telephone Encounter (Signed)
Complete

## 2014-06-17 DIAGNOSIS — I7389 Other specified peripheral vascular diseases: Secondary | ICD-10-CM | POA: Diagnosis not present

## 2014-06-17 DIAGNOSIS — E039 Hypothyroidism, unspecified: Secondary | ICD-10-CM | POA: Diagnosis not present

## 2014-06-17 DIAGNOSIS — E782 Mixed hyperlipidemia: Secondary | ICD-10-CM | POA: Diagnosis not present

## 2014-06-17 DIAGNOSIS — F419 Anxiety disorder, unspecified: Secondary | ICD-10-CM | POA: Diagnosis not present

## 2014-06-24 DIAGNOSIS — F419 Anxiety disorder, unspecified: Secondary | ICD-10-CM | POA: Diagnosis not present

## 2014-06-24 DIAGNOSIS — J431 Panlobular emphysema: Secondary | ICD-10-CM | POA: Diagnosis not present

## 2014-06-24 DIAGNOSIS — F328 Other depressive episodes: Secondary | ICD-10-CM | POA: Diagnosis not present

## 2014-06-24 DIAGNOSIS — E039 Hypothyroidism, unspecified: Secondary | ICD-10-CM | POA: Diagnosis not present

## 2014-06-24 DIAGNOSIS — I7389 Other specified peripheral vascular diseases: Secondary | ICD-10-CM | POA: Diagnosis not present

## 2014-09-16 DIAGNOSIS — F328 Other depressive episodes: Secondary | ICD-10-CM | POA: Diagnosis not present

## 2014-09-16 DIAGNOSIS — F419 Anxiety disorder, unspecified: Secondary | ICD-10-CM | POA: Diagnosis not present

## 2014-09-16 DIAGNOSIS — E782 Mixed hyperlipidemia: Secondary | ICD-10-CM | POA: Diagnosis not present

## 2014-09-16 DIAGNOSIS — J431 Panlobular emphysema: Secondary | ICD-10-CM | POA: Diagnosis not present

## 2014-09-16 DIAGNOSIS — E039 Hypothyroidism, unspecified: Secondary | ICD-10-CM | POA: Diagnosis not present

## 2014-12-18 DIAGNOSIS — F419 Anxiety disorder, unspecified: Secondary | ICD-10-CM | POA: Diagnosis not present

## 2014-12-18 DIAGNOSIS — Z716 Tobacco abuse counseling: Secondary | ICD-10-CM | POA: Diagnosis not present

## 2014-12-18 DIAGNOSIS — I7389 Other specified peripheral vascular diseases: Secondary | ICD-10-CM | POA: Diagnosis not present

## 2014-12-18 DIAGNOSIS — E782 Mixed hyperlipidemia: Secondary | ICD-10-CM | POA: Diagnosis not present

## 2014-12-18 DIAGNOSIS — E039 Hypothyroidism, unspecified: Secondary | ICD-10-CM | POA: Diagnosis not present

## 2014-12-25 ENCOUNTER — Other Ambulatory Visit: Payer: Self-pay

## 2014-12-25 ENCOUNTER — Encounter: Payer: Self-pay | Admitting: Family

## 2014-12-25 DIAGNOSIS — Z23 Encounter for immunization: Secondary | ICD-10-CM | POA: Diagnosis not present

## 2014-12-25 DIAGNOSIS — Z716 Tobacco abuse counseling: Secondary | ICD-10-CM | POA: Diagnosis not present

## 2014-12-25 DIAGNOSIS — F330 Major depressive disorder, recurrent, mild: Secondary | ICD-10-CM | POA: Diagnosis not present

## 2014-12-25 DIAGNOSIS — I739 Peripheral vascular disease, unspecified: Secondary | ICD-10-CM

## 2014-12-25 DIAGNOSIS — E039 Hypothyroidism, unspecified: Secondary | ICD-10-CM | POA: Diagnosis not present

## 2014-12-25 DIAGNOSIS — F419 Anxiety disorder, unspecified: Secondary | ICD-10-CM | POA: Diagnosis not present

## 2014-12-25 DIAGNOSIS — J431 Panlobular emphysema: Secondary | ICD-10-CM | POA: Diagnosis not present

## 2014-12-26 ENCOUNTER — Ambulatory Visit (HOSPITAL_COMMUNITY)
Admission: RE | Admit: 2014-12-26 | Discharge: 2014-12-26 | Disposition: A | Discharge status: Home / Self Care | Payer: Medicare Other | Source: Ambulatory Visit | Attending: Family | Admitting: Family

## 2014-12-26 ENCOUNTER — Encounter: Payer: Self-pay | Admitting: Family

## 2014-12-26 ENCOUNTER — Ambulatory Visit (INDEPENDENT_AMBULATORY_CARE_PROVIDER_SITE_OTHER): Payer: Medicare Other | Admitting: Family

## 2014-12-26 ENCOUNTER — Ambulatory Visit (INDEPENDENT_AMBULATORY_CARE_PROVIDER_SITE_OTHER)
Admission: RE | Admit: 2014-12-26 | Discharge: 2014-12-26 | Disposition: A | Discharge status: Home / Self Care | Payer: Medicare Other | Source: Ambulatory Visit | Attending: Family | Admitting: Family

## 2014-12-26 VITALS — BP 137/85 | HR 66 | Temp 97.8°F | Resp 16 | Ht 70.0 in | Wt 185.0 lb

## 2014-12-26 DIAGNOSIS — T82898A Other specified complication of vascular prosthetic devices, implants and grafts, initial encounter: Secondary | ICD-10-CM | POA: Diagnosis not present

## 2014-12-26 DIAGNOSIS — Z95828 Presence of other vascular implants and grafts: Secondary | ICD-10-CM

## 2014-12-26 DIAGNOSIS — I10 Essential (primary) hypertension: Secondary | ICD-10-CM | POA: Diagnosis not present

## 2014-12-26 DIAGNOSIS — I771 Stricture of artery: Secondary | ICD-10-CM | POA: Diagnosis not present

## 2014-12-26 DIAGNOSIS — Y832 Surgical operation with anastomosis, bypass or graft as the cause of abnormal reaction of the patient, or of later complication, without mention of misadventure at the time of the procedure: Secondary | ICD-10-CM | POA: Diagnosis not present

## 2014-12-26 DIAGNOSIS — F17200 Nicotine dependence, unspecified, uncomplicated: Secondary | ICD-10-CM

## 2014-12-26 DIAGNOSIS — I739 Peripheral vascular disease, unspecified: Secondary | ICD-10-CM | POA: Diagnosis not present

## 2014-12-26 DIAGNOSIS — Z720 Tobacco use: Secondary | ICD-10-CM | POA: Diagnosis not present

## 2014-12-26 DIAGNOSIS — I724 Aneurysm of artery of lower extremity: Secondary | ICD-10-CM | POA: Diagnosis not present

## 2014-12-26 DIAGNOSIS — T82392A Other mechanical complication of femoral arterial graft (bypass), initial encounter: Secondary | ICD-10-CM | POA: Diagnosis not present

## 2014-12-26 NOTE — Progress Notes (Signed)
[facility] [place] HISTORY AND PHYSICAL -PAD  History of Present Illness Terry Boyd is a 74 y.o. male patient of Dr. Oneida Alar returns for followup today s/p repair of left popliteal aneurysm on 03/20/2012. He has also undergone endovascular aneurysm stent grafting of his abdominal aorta on 04/22/09. He returns today with c/o left Leg/Shin pain that started gradually a month ago, does not seem to be worsening lately, pain is at 9/10 when walking for 50 yards, pain resolves after a minute of resting. He denies non healing wounds in his lower legs or feet. He states that both ankles hurt at rest or walking.   About 40 years ago he was told that he had a small fracture in one of his lumbar vertabra and that the spine is "closing up on it". He denies claudication symptoms with walking, denies non healing wounds He has had extensive evaluation of mid epigastric abdominal pain only with deep palpation, states no abnormalities found. He denies back pain.  Pt denies any history of stroke or TIA, denies any cardiac problems.  Pt Diabetic: No Pt smoker: smoker (1 ppd, started at age 11 yrs)  Pt meds include: Statin : yes ASA: Yes Other anticoagulants/antiplatelets: no    Past Medical History  Diagnosis Date  . AAA (abdominal aortic aneurysm)   . Smoker   . Chronic back pain   . Hypertension   . Thyroid disease   . COPD (chronic obstructive pulmonary disease)   . Hypothyroidism   . Depression   . Arthritis     Social History Social History  Substance Use Topics  . Smoking status: Current Every Day Smoker -- 57 years    Types: Cigars  . Smokeless tobacco: Never Used     Comment: pt states he smokes about 20 cigars per day  . Alcohol Use: No    Family History Family History  Problem Relation Age of Onset  . Diabetes Mother   . Heart disease Father   . Diabetes Father   . Heart disease Sister     CABG x 4  . Cancer Brother     throat cancer     Past Surgical History  Procedure Laterality Date  . Appendectomy    . Abdominal aortic aneurysm repair  04-22-2009    Stent graft repair of AAA  . Vasectomy  1982  . Endarterectomy popliteal  03/20/2012    Procedure: ENDARTERECTOMY POPLITEAL;  Surgeon: Elam Dutch, MD;  Location: [facility] OR;  Service: Vascular;  Laterality: Left;   LIGATION OF LEFT LEG POPLITEAL ANEURYSM  . Bypass graft popliteal to popliteal  03/20/2012    Procedure: BYPASS GRAFT POPLITEAL TO POPLITEAL;  Surgeon: Elam Dutch, MD;  Location: [facility] OR;  Service: Vascular;  Laterality: Left;  ABOVE THE KNEE POPLITEAL ARTERY TO BELOW THE KNEE POPLITEAL ARTERY BYPASS GRAFT USING REVERSE LEFT GREATER SAPHENOUS VEIN   . Intraoperative arteriogram  03/20/2012    Procedure: INTRA OPERATIVE ARTERIOGRAM;  Surgeon: Elam Dutch, MD;  Location: [place];  Service: Vascular;  Laterality: Left;  TIMES ONE  . Abdominal aortagram N/A 03/14/2012    Procedure: ABDOMINAL Maxcine Ham;  Surgeon: Serafina Mitchell, MD;  Location: [facility] CATH LAB;  Service: Cardiovascular;  Laterality: N/A;    Allergies  Allergen Reactions  . Morphine And Related Shortness Of Breath and Other (See Comments)    hallucinations    Current Outpatient Prescriptions  Medication Sig Dispense Refill  . aspirin EC 325 MG tablet  Take 1 tablet (325 mg total) by mouth daily. 100 tablet 3  . levothyroxine (SYNTHROID, LEVOTHROID) 125 MCG tablet Take 150 mcg by mouth daily.     Marland Kitchen levothyroxine (SYNTHROID, LEVOTHROID) 150 MCG tablet daily.    Marland Kitchen LORazepam (ATIVAN) 1 MG tablet Take 1 mg by mouth at bedtime as needed. sleep    . oxyCODONE (OXY IR/ROXICODONE) 5 MG immediate release tablet Take 1-2 tablets (5-10 mg total) by mouth every 4 (four) hours as needed. (Patient not taking: Reported on 05/31/2014) 30 tablet 0  . sertraline (ZOLOFT) 50 MG tablet Take 50 mg by mouth daily.     No current facility-administered medications for this visit.    ROS: See HPI for pertinent  positives and negatives.   Physical Examination  Filed Vitals:   12/26/14 1421 12/26/14 1429  BP: 145/86 137/85  Pulse: 68 66  Temp: 97.8 F (36.6 C)   TempSrc: Oral   Resp: 16   Height: '5\' 10"'$  (1.778 m)   Weight: 185 lb (83.915 kg)   SpO2: 97%    Body mass index is 26.54 kg/(m^2).   General: WDWN in NAD Gait: Normal HENT: WNL Eyes: Pupils equal Pulmonary: normal non-labored breathing, without rales or rhonchi,+transient wheezes, minimal air movement in all fields, occasional dry cough. Cardiac: RRR, no murmur detected  Abdomen: soft, NT, reducible ventral hernia. Skin: no rashes, no ulcers, no cellulitis.   VASCULAR EXAM  Carotid Bruits Right Left   Negative Negative  radial pulses are 2+ palpable and = Aorta is not palpable   VASCULAR EXAM: Extremities without ischemic changes  without Gangrene; without open wounds. Both feet are pink when dependent; left toes become slightly cyanotic when feet are elevated.     LE Pulses Right Left   FEMORAL 3+ palpable 3+ palpable    POPLITEAL 2+t palpable  not palpable   POSTERIOR TIBIAL 2+ palpable  not palpable    DORSALIS PEDIS  ANTERIOR TIBIAL faintly palpable  not palpable     Musculoskeletal: no muscle wasting or atrophy; no LE edema Neurologic: A&O X 3; Appropriate Affect ;  SENSATION: normal; MOTOR FUNCTION: 5/5 Symmetric, CN 2-12 intact Speech is fluent/normal              Non-Invasive Vascular Imaging: DATE: 12/26/2014 LOWER EXTREMITY ARTERIAL DUPLEX EVALUATION    INDICATION: Left popliteal artery bypass graft with new left lower extremity claudication    PREVIOUS INTERVENTION(S):     DUPLEX EXAM:     RIGHT  LEFT   Peak Systolic Velocity (cm/s) Ratio (if  abnormal) Waveform  Peak Systolic Velocity (cm/s) Ratio (if abnormal) Waveform     Inflow Artery 0  Absent     Proximal Anastomosis 0  Absent     Proximal Graft 0  Absent     Mid Graft 0  Absent      Distal Graft 0  Absent     Distal Anastomosis 0  Absent     Outflow Artery 0  Absent  1.10 Today's ABI / TBI 0.55  1.07 Previous ABI / TBI (05/31/2014 ) 1.07    Waveform:    M - Monophasic       B - Biphasic       T - Triphasic  If Ankle Brachial Index (ABI) or Toe Brachial Index (TBI) performed, please see complete report  ADDITIONAL FINDINGS:     IMPRESSION: Native artery is occluded at the level of an aneurysm in the distal superficial femoral artery; occlusion continues through  graft and extends just beyond the distal anastomosis into the native outflow.     Compared to the previous exam:  Occluded graft with soft thrombus.     ASSESSMENT: Terry Boyd is a 74 y.o. male who is s/p repair of left popliteal aneurysm on 03/20/2012. He has also undergone endovascular aneurysm stent grafting of his abdominal aorta on 04/22/09. He returns today with c/o left Leg/Shin pain that started gradually a month ago, does not seem to be worsening lately, pain is at 9/10 when walking for 50 yards, pain resolves after a minute of resting. He has no signs of ischemia in his lower legs or feet other than mild cyanosis in left toes when legs are elevated; when feet are dependant toes are pink.  Today's left LE arterial duplex demonstrates occluded native artery at the level of an aneurysm in the distal superficial femoral artery; occlusion continues through graft and extends just beyond the distal anastomosis into the native outflow.   In April 2016 bilateral ABI's were normal with all triphasic waveforms.  Today the left ABI remains normal with triphasic waveforms; however, the right ABI decreased to 0.55 with monophasic waveforms indicating moderate arterial occlusive disease.  Unfortunately he continues  to smoke a ppd; fortunately he does not have DM.   Dr. Oneida Alar spoke with pt. Pt states he would rather not have surgery at this time, states he will wait a month and return to see Dr. Oneida Alar, evaluate how he is doing in a month including whether he wants revascularization of his left leg.   PLAN:  Graduated walking program.  The patient was counseled re smoking cessation and given several free resources re smoking cessation.  Based on the patient's vascular studies and examination, pt will return to clinic in 1 month for evaluation of left leg claudication by Dr. Oneida Alar, discuss possible revascularization.   I discussed in depth with the patient the nature of atherosclerosis, and emphasized the importance of maximal medical management including strict control of blood pressure, blood glucose, and lipid levels, obtaining regular exercise, and cessation of smoking.  The patient is aware that without maximal medical management the underlying atherosclerotic disease process will progress, limiting the benefit of any interventions.  The patient was given information about PAD including signs, symptoms, treatment, what symptoms should prompt the patient to seek immediate medical care, and risk reduction measures to take.  Clemon Chambers, RN, MSN, FNP-C [facility] Phone: [phone]  Clinic MD: Oneida Alar  12/26/2014 2:23 PM

## 2014-12-26 NOTE — Progress Notes (Signed)
Filed Vitals:   12/26/14 1421 12/26/14 1429  BP: 145/86 137/85  Pulse: 68 66  Temp: 97.8 F (36.6 C)   TempSrc: Oral   Resp: 16   Height: '5\' 10"'$  (1.778 m)   Weight: 185 lb (83.915 kg)   SpO2: 97%

## 2014-12-26 NOTE — Patient Instructions (Signed)
Intermittent Claudication Intermittent claudication is pain in your leg that occurs when you walk or exercise and goes away when you rest. The pain can occur in one or both legs. CAUSES Intermittent claudication is caused by the buildup of plaque within the major arteries in the body (atherosclerosis). The plaque, which makes arteries stiff and narrow, prevents enough blood from reaching your leg muscles. The pain occurs when you walk or exercise because your muscles need more blood when you are moving and exercising. RISK FACTORS Risk factors include:  A family history of atherosclerosis.  A personal history of stroke or heart disease.  Older age.  Being inactive or overweight.  Smoking cigarettes.  Having another health condition such as:  Diabetes.  High blood pressure.  High cholesterol. SIGNS AND SYMPTOMS  Your hip or leg may:   Ache.  Cramp.  Feel tight.  Feel weak.  Feel heavy. Over time, you may feel pain in your calf, thigh, or hip. DIAGNOSIS  Your health care provider may diagnose intermittent claudication based on your symptoms and medical history. Your health care provider may also do tests to learn more about your condition. These may include:  Blood tests.  An ultrasound.  Imaging tests such as angiography, magnetic resonance angiography (MRA), and computed tomography angiography (CTA). TREATMENT You may be treated for problems such as:  High blood pressure.  High cholesterol.  Diabetes. Other treatments may include:  Lifestyle changes such as:  Starting an exercise program.  Losing weight.  Quitting smoking.  Medicines to help restore blood flow through your legs.  Blood vessel surgery (angioplasty) to restore blood flow if your intermittent claudication is caused by severe peripheral artery disease. HOME CARE INSTRUCTIONS  Manage any other health conditions you have.  Eat a diet low in saturated fats and calories to maintain a  healthy weight.  Quit smoking, if you smoke.  Take medicines only as directed by your health care provider.  If your health care provider recommended an exercise program for you, follow it as directed. Your exercise program may involve:  Walking three or more times a week.  Walking until you have certain symptoms of intermittent claudication.  Resting until symptoms go away.  Gradually increasing walking time to about 50 minutes a day. SEEK MEDICAL CARE IF: Your condition is not getting better or is getting worse. SEEK IMMEDIATE MEDICAL CARE IF:   You have chest pain.  You have difficulty breathing.  You develop arm weakness.  You have trouble speaking.  Your face begins to droop. MAKE SURE YOU:  Understand these instructions.  Will watch your condition.  Will get help if you are not doing well or get worse.   This information is not intended to replace advice given to you by your health care provider. Make sure you discuss any questions you have with your health care provider.   Document Released: 12/19/2003 Document Revised: 03/08/2014 Document Reviewed: 05/24/2013 Elsevier Interactive Patient Education 2016 [employer].    Smoking Cessation, Tips for Success If you are ready to quit smoking, congratulations! You have chosen to help yourself be healthier. Cigarettes bring nicotine, tar, carbon monoxide, and other irritants into your body. Your lungs, heart, and blood vessels will be able to work better without these poisons. There are many different ways to quit smoking. Nicotine gum, nicotine patches, a nicotine inhaler, or nicotine nasal spray can help with physical craving. Hypnosis, support groups, and medicines help break the habit of smoking. WHAT THINGS CAN I  DO TO MAKE QUITTING EASIER?  Here are some tips to help you quit for good:  Pick a date when you will quit smoking completely. Tell all of your friends and family about your plan to quit on that  date.  Do not try to slowly cut down on the number of cigarettes you are smoking. Pick a quit date and quit smoking completely starting on that day.  Throw away all cigarettes.   Clean and remove all ashtrays from your home, work, and car.  On a card, write down your reasons for quitting. Carry the card with you and read it when you get the urge to smoke.  Cleanse your body of nicotine. Drink enough water and fluids to keep your urine clear or pale yellow. Do this after quitting to flush the nicotine from your body.  Learn to predict your moods. Do not let a bad situation be your excuse to have a cigarette. Some situations in your life might tempt you into wanting a cigarette.  Never have "just one" cigarette. It leads to wanting another and another. Remind yourself of your decision to quit.  Change habits associated with smoking. If you smoked while driving or when feeling stressed, try other activities to replace smoking. Stand up when drinking your coffee. Brush your teeth after eating. Sit in a different chair when you read the paper. Avoid alcohol while trying to quit, and try to drink fewer caffeinated beverages. Alcohol and caffeine may urge you to smoke.  Avoid foods and drinks that can trigger a desire to smoke, such as sugary or spicy foods and alcohol.  Ask people who smoke not to smoke around you.  Have something planned to do right after eating or having a cup of coffee. For example, plan to take a walk or exercise.  Try a relaxation exercise to calm you down and decrease your stress. Remember, you may be tense and nervous for the first 2 weeks after you quit, but this will pass.  Find new activities to keep your hands busy. Play with a pen, coin, or rubber band. Doodle or draw things on paper.  Brush your teeth right after eating. This will help cut down on the craving for the taste of tobacco after meals. You can also try mouthwash.   Use oral substitutes in place of  cigarettes. Try using lemon drops, carrots, cinnamon sticks, or chewing gum. Keep them handy so they are available when you have the urge to smoke.  When you have the urge to smoke, try deep breathing.  Designate your home as a nonsmoking area.  If you are a heavy smoker, ask your health care provider about a prescription for nicotine chewing gum. It can ease your withdrawal from nicotine.  Reward yourself. Set aside the cigarette money you save and buy yourself something nice.  Look for support from others. Join a support group or smoking cessation program. Ask someone at home or at work to help you with your plan to quit smoking.  Always ask yourself, "Do I need this cigarette or is this just a reflex?" Tell yourself, "Today, I choose not to smoke," or "I do not want to smoke." You are reminding yourself of your decision to quit.  Do not replace cigarette smoking with electronic cigarettes (commonly called e-cigarettes). The safety of e-cigarettes is unknown, and some may contain harmful chemicals.  If you relapse, do not give up! Plan ahead and think about what you will do the next time  you get the urge to smoke. HOW WILL I FEEL WHEN I QUIT SMOKING? You may have symptoms of withdrawal because your body is used to nicotine (the addictive substance in cigarettes). You may crave cigarettes, be irritable, feel very hungry, cough often, get headaches, or have difficulty concentrating. The withdrawal symptoms are only temporary. They are strongest when you first quit but will go away within 10-14 days. When withdrawal symptoms occur, stay in control. Think about your reasons for quitting. Remind yourself that these are signs that your body is healing and getting used to being without cigarettes. Remember that withdrawal symptoms are easier to treat than the major diseases that smoking can cause.  Even after the withdrawal is over, expect periodic urges to smoke. However, these cravings are generally  short lived and will go away whether you smoke or not. Do not smoke! WHAT RESOURCES ARE AVAILABLE TO HELP ME QUIT SMOKING? Your health care provider can direct you to community resources or hospitals for support, which may include:  Group support.  Education.  Hypnosis.  Therapy.   This information is not intended to replace advice given to you by your health care provider. Make sure you discuss any questions you have with your health care provider.   Document Released: 11/14/2003 Document Revised: 03/08/2014 Document Reviewed: 08/03/2012 Elsevier Interactive Patient Education 2016 [employer].    Steps to Quit Smoking  Smoking tobacco can be harmful to your health and can affect almost every organ in your body. Smoking puts you, and those around you, at risk for developing many serious chronic diseases. Quitting smoking is difficult, but it is one of the best things that you can do for your health. It is never too late to quit. WHAT ARE THE BENEFITS OF QUITTING SMOKING? When you quit smoking, you lower your risk of developing serious diseases and conditions, such as:  Lung cancer or lung disease, such as COPD.  Heart disease.  Stroke.  Heart attack.  Infertility.  Osteoporosis and bone fractures. Additionally, symptoms such as coughing, wheezing, and shortness of breath may get better when you quit. You may also find that you get sick less often because your body is stronger at fighting off colds and infections. If you are pregnant, quitting smoking can help to reduce your chances of having a baby of low birth weight. HOW DO I GET READY TO QUIT? When you decide to quit smoking, create a plan to make sure that you are successful. Before you quit:  Pick a date to quit. Set a date within the next two weeks to give you time to prepare.  Write down the reasons why you are quitting. Keep this list in places where you will see it often, such as on your bathroom mirror or in your  car or wallet.  Identify the people, places, things, and activities that make you want to smoke (triggers) and avoid them. Make sure to take these actions:  Throw away all cigarettes at home, at work, and in your car.  Throw away smoking accessories, such as Scientist, research (medical).  Clean your car and make sure to empty the ashtray.  Clean your home, including curtains and carpets.  Tell your family, friends, and coworkers that you are quitting. Support from your loved ones can make quitting easier.  Talk with your health care provider about your options for quitting smoking.  Find out what treatment options are covered by your health insurance. WHAT STRATEGIES CAN I USE TO QUIT SMOKING?  Talk with your healthcare provider about different strategies to quit smoking. Some strategies include:  Quitting smoking altogether instead of gradually lessening how much you smoke over a period of time. Research shows that quitting "cold [place]" is more successful than gradually quitting.  Attending in-person counseling to help you build problem-solving skills. You are more likely to have success in quitting if you attend several counseling sessions. Even short sessions of 10 minutes can be effective.  Finding resources and support systems that can help you to quit smoking and remain smoke-free after you quit. These resources are most helpful when you use them often. They can include:  Online chats with a Social worker.  Telephone quitlines.  Printed Furniture conservator/restorer.  Support groups or group counseling.  Text messaging programs.  Mobile phone applications.  Taking medicines to help you quit smoking. (If you are pregnant or breastfeeding, talk with your health care provider first.) Some medicines contain nicotine and some do not. Both types of medicines help with cravings, but the medicines that include nicotine help to relieve withdrawal symptoms. Your health care provider may  recommend:  Nicotine patches, gum, or lozenges.  Nicotine inhalers or sprays.  Non-nicotine medicine that is taken by mouth. Talk with your health care provider about combining strategies, such as taking medicines while you are also receiving in-person counseling. Using these two strategies together makes you more likely to succeed in quitting than if you used either strategy on its own. If you are pregnant or breastfeeding, talk with your health care provider about finding counseling or other support strategies to quit smoking. Do not take medicine to help you quit smoking unless told to do so by your health care provider. WHAT THINGS CAN I DO TO MAKE IT EASIER TO QUIT? Quitting smoking might feel overwhelming at first, but there is a lot that you can do to make it easier. Take these important actions:  Reach out to your family and friends and ask that they support and encourage you during this time. Call telephone quitlines, reach out to support groups, or work with a counselor for support.  Ask people who smoke to avoid smoking around you.  Avoid places that trigger you to smoke, such as bars, parties, or smoke-break areas at work.  Spend time around people who do not smoke.  Lessen stress in your life, because stress can be a smoking trigger for some people. To lessen stress, try:  Exercising regularly.  Deep-breathing exercises.  Yoga.  Meditating.  Performing a body scan. This involves closing your eyes, scanning your body from head to toe, and noticing which parts of your body are particularly tense. Purposefully relax the muscles in those areas.  Download or purchase mobile phone or tablet apps (applications) that can help you stick to your quit plan by providing reminders, tips, and encouragement. There are many free apps, such as QuitGuide from the [employer] Office manager for Disease Control and Prevention). You can find other support for quitting smoking (smoking cessation) through  smokefree.gov and other websites. HOW WILL I FEEL WHEN I QUIT SMOKING? Within the first 24 hours of quitting smoking, you may start to feel some withdrawal symptoms. These symptoms are usually most noticeable 2-3 days after quitting, but they usually do not last beyond 2-3 weeks. Changes or symptoms that you might experience include:  Mood swings.  Restlessness, anxiety, or irritation.  Difficulty concentrating.  Dizziness.  Strong cravings for sugary foods in addition to nicotine.  Mild weight gain.  Constipation.  Nausea.  Coughing or a sore throat.  Changes in how your medicines work in your body.  A depressed mood.  Difficulty sleeping (insomnia). After the first 2-3 weeks of quitting, you may start to notice more positive results, such as:  Improved sense of smell and taste.  Decreased coughing and sore throat.  Slower heart rate.  Lower blood pressure.  Clearer skin.  The ability to breathe more easily.  Fewer sick days. Quitting smoking is very challenging for most people. Do not get discouraged if you are not successful the first time. Some people need to make many attempts to quit before they achieve long-term success. Do your best to stick to your quit plan, and talk with your health care provider if you have any questions or concerns.   This information is not intended to replace advice given to you by your health care provider. Make sure you discuss any questions you have with your health care provider.   Document Released: 02/09/2001 Document Revised: 07/02/2014 Document Reviewed: 07/02/2014 Elsevier Interactive Patient Education [employer].

## 2015-01-08 DIAGNOSIS — R928 Other abnormal and inconclusive findings on diagnostic imaging of breast: Secondary | ICD-10-CM | POA: Diagnosis not present

## 2015-01-08 DIAGNOSIS — N6489 Other specified disorders of breast: Secondary | ICD-10-CM | POA: Diagnosis not present

## 2015-01-31 ENCOUNTER — Encounter: Payer: Self-pay | Admitting: Family

## 2015-02-06 ENCOUNTER — Encounter: Payer: Self-pay | Admitting: Vascular Surgery

## 2015-02-06 ENCOUNTER — Ambulatory Visit (INDEPENDENT_AMBULATORY_CARE_PROVIDER_SITE_OTHER): Payer: Medicare Other | Admitting: Vascular Surgery

## 2015-02-06 VITALS — BP 162/92 | HR 66 | Temp 98.0°F | Resp 16 | Ht 70.0 in | Wt 189.0 lb

## 2015-02-06 DIAGNOSIS — I70212 Atherosclerosis of native arteries of extremities with intermittent claudication, left leg: Secondary | ICD-10-CM

## 2015-02-06 NOTE — Progress Notes (Signed)
Filed Vitals:   02/06/15 1409 02/06/15 1416  BP: 155/90 162/92  Pulse: 71 66  Temp: 98 F (36.7 C)   TempSrc: Oral   Resp: 16   Height: '5\' 10"'$  (1.778 m)   Weight: 189 lb (85.73 kg)   SpO2: 96%

## 2015-02-06 NOTE — Progress Notes (Signed)
Patient is a 74 year old male who returns today for follow-up for peripheral arterial disease. He previously had an above-knee to below-knee popliteal bypass in 2014 for popliteal artery aneurysm. This occluded in October 2016. He still has some claudication symptoms in the left leg but he states that currently these are tolerable. He denies rest pain. He has no nonhealing wounds. Recent ABIs October 2016 were 0.55 on the left 1.1 on the right.  Past Medical History  Diagnosis Date  . AAA (abdominal aortic aneurysm) ([place])   . Smoker   . Chronic back pain   . Hypertension   . Thyroid disease   . COPD (chronic obstructive pulmonary disease) ([place])   . Hypothyroidism   . Depression   . Arthritis    Past Surgical History  Procedure Laterality Date  . Appendectomy    . Abdominal aortic aneurysm repair  04-22-2009    Stent graft repair of AAA  . Vasectomy  1982  . Endarterectomy popliteal  03/20/2012    Procedure: ENDARTERECTOMY POPLITEAL;  Surgeon: Elam Dutch, MD;  Location: [facility] OR;  Service: Vascular;  Laterality: Left;   LIGATION OF LEFT LEG POPLITEAL ANEURYSM  . Bypass graft popliteal to popliteal  03/20/2012    Procedure: BYPASS GRAFT POPLITEAL TO POPLITEAL;  Surgeon: Elam Dutch, MD;  Location: [facility] OR;  Service: Vascular;  Laterality: Left;  ABOVE THE KNEE POPLITEAL ARTERY TO BELOW THE KNEE POPLITEAL ARTERY BYPASS GRAFT USING REVERSE LEFT GREATER SAPHENOUS VEIN   . Intraoperative arteriogram  03/20/2012    Procedure: INTRA OPERATIVE ARTERIOGRAM;  Surgeon: Elam Dutch, MD;  Location: [facility];  Service: Vascular;  Laterality: Left;  TIMES ONE  . Abdominal aortagram N/A 03/14/2012    Procedure: ABDOMINAL Maxcine Ham;  Surgeon: Serafina Mitchell, MD;  Location: [facility] CATH LAB;  Service: Cardiovascular;  Laterality: N/A;   Review of systems: He denies shortness of breath. He denies chest pain.  Physical exam: Filed Vitals:   02/06/15 1409 02/06/15 1416  BP: 155/90 162/92  Pulse: 71 66   Temp: 98 F (36.7 C)   TempSrc: Oral   Resp: 16   Height: '5\' 10"'$  (1.778 m)   Weight: 189 lb (85.73 kg)   SpO2: 96%    Neck no carotid bruits  Chest: Clear to auscultation bilaterally  Cardiac: Regular rate and rhythm  Abdomen: Soft nontender nondistended  Extremities: 2+ femoral pulses bilaterally. 2+ right dorsalis pedis pulse absent pedal pulses left foot  Assessment: Tolerable claudication symptoms left lower extremity.  Plan: Follow-up 6 months with repeat ABIs. Also needs duplex of his endovascular stent graft at that appointment.  Ruta Hinds, MD [facility] Office: [phone] Pager: [phone]

## 2015-02-07 NOTE — Addendum Note (Signed)
Addended by: Thresa Ross C on: 02/07/2015 01:57 PM   Modules accepted: Orders

## 2015-06-06 ENCOUNTER — Ambulatory Visit: Payer: Medicare Other | Admitting: Family

## 2015-06-06 ENCOUNTER — Encounter (HOSPITAL_COMMUNITY): Payer: Medicare Other

## 2015-06-06 ENCOUNTER — Other Ambulatory Visit (HOSPITAL_COMMUNITY): Payer: Medicare Other

## 2015-08-01 ENCOUNTER — Encounter: Payer: Self-pay | Admitting: Family

## 2015-08-08 ENCOUNTER — Ambulatory Visit (INDEPENDENT_AMBULATORY_CARE_PROVIDER_SITE_OTHER): Payer: Medicare Other | Admitting: Family

## 2015-08-08 ENCOUNTER — Ambulatory Visit (INDEPENDENT_AMBULATORY_CARE_PROVIDER_SITE_OTHER)
Admission: RE | Admit: 2015-08-08 | Discharge: 2015-08-08 | Disposition: A | Discharge status: Home / Self Care | Payer: Medicare Other | Source: Ambulatory Visit | Attending: Family | Admitting: Family

## 2015-08-08 ENCOUNTER — Ambulatory Visit (HOSPITAL_COMMUNITY)
Admission: RE | Admit: 2015-08-08 | Discharge: 2015-08-08 | Disposition: A | Discharge status: Home / Self Care | Payer: Medicare Other | Source: Ambulatory Visit | Attending: Family | Admitting: Family

## 2015-08-08 ENCOUNTER — Encounter: Payer: Self-pay | Admitting: Family

## 2015-08-08 VITALS — BP 156/88 | HR 64 | Ht 70.0 in | Wt 190.0 lb

## 2015-08-08 DIAGNOSIS — T82392D Other mechanical complication of femoral arterial graft (bypass), subsequent encounter: Secondary | ICD-10-CM | POA: Diagnosis not present

## 2015-08-08 DIAGNOSIS — I70212 Atherosclerosis of native arteries of extremities with intermittent claudication, left leg: Secondary | ICD-10-CM | POA: Diagnosis not present

## 2015-08-08 DIAGNOSIS — I779 Disorder of arteries and arterioles, unspecified: Secondary | ICD-10-CM | POA: Diagnosis not present

## 2015-08-08 DIAGNOSIS — I714 Abdominal aortic aneurysm, without rupture, unspecified: Secondary | ICD-10-CM

## 2015-08-08 DIAGNOSIS — F17200 Nicotine dependence, unspecified, uncomplicated: Secondary | ICD-10-CM

## 2015-08-08 DIAGNOSIS — R938 Abnormal findings on diagnostic imaging of other specified body structures: Secondary | ICD-10-CM | POA: Diagnosis not present

## 2015-08-08 DIAGNOSIS — Z95828 Presence of other vascular implants and grafts: Secondary | ICD-10-CM

## 2015-08-08 DIAGNOSIS — I724 Aneurysm of artery of lower extremity: Secondary | ICD-10-CM

## 2015-08-08 DIAGNOSIS — Z720 Tobacco use: Secondary | ICD-10-CM

## 2015-08-08 DIAGNOSIS — F329 Major depressive disorder, single episode, unspecified: Secondary | ICD-10-CM | POA: Insufficient documentation

## 2015-08-08 DIAGNOSIS — I10 Essential (primary) hypertension: Secondary | ICD-10-CM | POA: Insufficient documentation

## 2015-08-08 DIAGNOSIS — T82898D Other specified complication of vascular prosthetic devices, implants and grafts, subsequent encounter: Secondary | ICD-10-CM

## 2015-08-08 DIAGNOSIS — R0989 Other specified symptoms and signs involving the circulatory and respiratory systems: Secondary | ICD-10-CM | POA: Diagnosis present

## 2015-08-08 NOTE — Progress Notes (Signed)
[facility] [place]  CC: Follow up Peripheral Artery Occlusive Disease, and s/p EVAR   History of Present Illness  Terry Boyd is a 75 y.o. (11-24-1940) male patient of Dr. Oneida Alar who who returns today for follow-up for peripheral arterial disease and duplex evaluation s/p EVAR in 2011. He previously had an above-knee to below-knee popliteal bypass in 2014 for popliteal artery aneurysm. This occluded in October 2016. He still has some claudication symptoms in the left calf but he states that currently this is tolerable and is actually improving. He denies rest pain. He has no nonhealing wounds.   ABIs October 2016 were 0.55 on the left 1.1 on the right.  About 40 years ago he was told that he had a small fracture in one of his lumbar vertabra and that the spine is "closing up on it". He denies claudication symptoms with walking, denies non healing wounds He has had extensive evaluation of mid epigastric abdominal pain only with deep palpation, states no abnormalities found. He denies back pain.  Pt denies any history of stroke or TIA, denies any cardiac problems.  Pt Diabetic: No Pt smoker: smoker (1 ppd, started at age 83 yrs)  Pt meds include: Statin : no, caused leg cramps ASA: Yes Other anticoagulants/antiplatelets: no   Past Medical History  Diagnosis Date  . AAA (abdominal aortic aneurysm) ([place])   . Smoker   . Chronic back pain   . Hypertension   . Thyroid disease   . COPD (chronic obstructive pulmonary disease) ([place])   . Hypothyroidism   . Depression   . Arthritis    Past Surgical History  Procedure Laterality Date  . Appendectomy    . Abdominal aortic aneurysm repair  04-22-2009    Stent graft repair of AAA  . Vasectomy  1982  . Endarterectomy popliteal  03/20/2012    Procedure: ENDARTERECTOMY POPLITEAL;  Surgeon: Elam Dutch, MD;  Location: [facility] OR;  Service: Vascular;  Laterality: Left;   LIGATION OF LEFT LEG POPLITEAL ANEURYSM  .  Bypass graft popliteal to popliteal  03/20/2012    Procedure: BYPASS GRAFT POPLITEAL TO POPLITEAL;  Surgeon: Elam Dutch, MD;  Location: [facility] OR;  Service: Vascular;  Laterality: Left;  ABOVE THE KNEE POPLITEAL ARTERY TO BELOW THE KNEE POPLITEAL ARTERY BYPASS GRAFT USING REVERSE LEFT GREATER SAPHENOUS VEIN   . Intraoperative arteriogram  03/20/2012    Procedure: INTRA OPERATIVE ARTERIOGRAM;  Surgeon: Elam Dutch, MD;  Location: [place];  Service: Vascular;  Laterality: Left;  TIMES ONE  . Abdominal aortagram N/A 03/14/2012    Procedure: ABDOMINAL Maxcine Ham;  Surgeon: Serafina Mitchell, MD;  Location: [facility] CATH LAB;  Service: Cardiovascular;  Laterality: N/A;   Social History Social History  Substance Use Topics  . Smoking status: Current Every Day Smoker -- 57 years    Types: Cigars  . Smokeless tobacco: Never Used     Comment: pt states he smokes about 20 cigars per day  . Alcohol Use: No   Family History Family History  Problem Relation Age of Onset  . Diabetes Mother   . Heart disease Father   . Diabetes Father   . Heart disease Sister     CABG x 4  . Cancer Brother     throat cancer   Current Outpatient Prescriptions on File Prior to Visit  Medication Sig Dispense Refill  . aspirin EC 325 MG tablet Take 1 tablet (325 mg total) by mouth daily. 100 tablet  3  . levothyroxine (SYNTHROID, LEVOTHROID) 125 MCG tablet Take 150 mcg by mouth daily.     Marland Kitchen levothyroxine (SYNTHROID, LEVOTHROID) 150 MCG tablet daily.    Marland Kitchen LORazepam (ATIVAN) 1 MG tablet Take 1 mg by mouth at bedtime as needed. sleep    . potassium chloride (K-DUR) 10 MEQ tablet Take 10 mEq by mouth daily.    . sertraline (ZOLOFT) 50 MG tablet Take 50 mg by mouth daily.    Marland Kitchen oxyCODONE (OXY IR/ROXICODONE) 5 MG immediate release tablet Take 1-2 tablets (5-10 mg total) by mouth every 4 (four) hours as needed. (Patient not taking: Reported on 12/26/2014) 30 tablet 0   No current facility-administered medications on file prior to  visit.   Allergies  Allergen Reactions  . Morphine And Related Shortness Of Breath and Other (See Comments)    hallucinations     ROS: See HPI for pertinent positives and negatives.  Physical Examination  Filed Vitals:   08/08/15 1020 08/08/15 1021  BP: 160/91 156/88  Pulse: 64   Height: '5\' 10"'$  (1.778 m)   Weight: 190 lb (86.183 kg)   SpO2: 97%    Body mass index is 27.26 kg/(m^2).  General: WDWN in NAD Gait: Normal HENT: WNL Eyes: Pupils equal Pulmonary: normal non-labored breathing, without rales or rhonchi,no wheezes, minimal air movement in all fields, occasional dry cough. Cardiac: RRR, no murmur detected  Abdomen: soft, NT, reducible ventral hernia. Skin: no rashes, no ulcers, no cellulitis.   VASCULAR EXAM  Carotid Bruits Right Left   Negative Negative  radial pulses are 2+ palpable and = Aorta is not palpable   VASCULAR EXAM: Extremities without ischemic changes  without Gangrene; without open wounds. Toes of both feet are pink and warm with brisk capillary refill.     LE Pulses Right Left   FEMORAL 2+ palpable 3+ palpable    POPLITEAL nott palpable  not palpable   POSTERIOR TIBIAL 2+ palpable  not palpable    DORSALIS PEDIS  ANTERIOR TIBIAL faintly palpable  not palpable     Musculoskeletal: no muscle wasting or atrophy; moderate kyphosis, no LE edema Neurologic: A&O X 3; Appropriate Affect ;  SENSATION: normal; MOTOR FUNCTION: 5/5 Symmetric, CN 2-12 intact Speech is fluent/normal                     Non-Invasive Vascular Imaging (Date: 08/08/2015):  EVAR Duplex: ABDOMINAL AORTA DUPLEX EVALUATION - POST ENDOVASCULAR REPAIR    INDICATION: Abdominal aortic aneurysm     PREVIOUS INTERVENTION(S): Endovascular aortic repair 04/22/2009    DUPLEX EXAM:      DIAMETER AP (cm) DIAMETER TRANSVERSE (cm) VELOCITIES (cm/sec)  Aorta 3.0 3.0 46  Right Common Iliac 1.2 1.2 70  Left Common Iliac 1.4 1.3 44    Comparison Study       Date DIAMETER AP (cm) DIAMETER TRANSVERSE (cm)  05/31/2014 3.3 3.2     ADDITIONAL FINDINGS:     IMPRESSION: 1. Technically difficult exam due to bowel gas 2. 3.0 x 3.0 sac size    Compared to the previous exam:  Decreased in size since prior exam      LOWER EXTREMITY ARTERIAL DUPLEX EVALUATION    INDICATION: PVD    PREVIOUS INTERVENTION(S): History of left popliteal artery bypass graft (2014) for popliteal aneurysm, known occlusion    DUPLEX EXAM: Lower extremity arterial duplex    RIGHT  LEFT   Peak Systolic Velocity (cm/s) Ratio (if abnormal) Waveform  Peak Systolic Velocity (cm/s)  Ratio (if abnormal) Waveform     Common Femoral Artery 46  B     Deep Femoral Artery 96  B     Superficial Femoral Artery Proximal 53  B     Superficial Femoral Artery Mid 46  B     Superficial Femoral Artery Distal 41  B-T     Popliteal Artery N/V  -     Posterior Tibial Artery Dist 17  M     Anterior Tibial Artery Distal 15  DM     Peroneal Artery Distal NV  -  1.0 Today's ABI / TBI 0.67  1.1 Previous ABI / TBI ( 12/26/14 ) 0.59    Waveform:    M - Monophasic       B - Biphasic       T - Triphasic  If Ankle Brachial Index (ABI) or Toe Brachial Index (TBI) performed, please see complete report     ADDITIONAL FINDINGS: Heterogenous plaque noted throughout the left lower extremity    IMPRESSION: 1. No color flow or Doppler signal visualized in what appears to be the popliteal bypass graft with collaterals visualized 2. The left common femoral and femoral artery appears patent proximal to the bypass graft    Compared to the previous exam:  No change since prior exam     Medical Decision Making  Terry Boyd is a 75 y.o. male  who presents s/p EVAR (Date: 04/22/2009).  Pt is asymptomatic with a decrease in sac size (3.0 cm), based on limited visualization. Pt states his blood pressure at home is "low", and at his PCP are normal; he is scheduled to see his PCP next week. Unfortunately he continues to smoke, but seems receptive to quitting. He was counseled re smoking cessation and given several free resources re smoking cessation. Fortunately he does not have DM. The claudication in his left calf seems to be improving as he is able to do more yard works with less left calf pain. He has no claudication symptoms in his right lower extremity.  ABI's remain normal in the right leg with tri and biphasic waveforms, and slightly improved in the left leg to 67% from 59% with monophasic waveforms.   I discussed with the patient the importance of surveillance of the endograft.  The next endograft duplex will be scheduled for 12 months; will also check ABI's at that time. Since the left popliteal artery bypass graft is known to be occluded, there is no need to evaluate the left leg with future arterial duplex, will just obtain ABI's.  The patient will follow up with [place] in 12 months with these studies.  I emphasized the importance of maximal medical management including strict control of blood pressure, blood glucose, and lipid levels, antiplatelet agents, obtaining regular exercise, and cessation of smoking.   Thank you for allowing [place] to participate in this patient's care.  Clemon Chambers, RN, MSN, FNP-C [facility] [place] Office: [phone]  Clinic Physician: Bridgett Larsson  08/08/2015, 10:36 AM

## 2015-08-08 NOTE — Patient Instructions (Addendum)
Peripheral Vascular Disease Peripheral vascular disease (PVD) is a disease of the blood vessels that are not part of your heart and brain. A simple term for PVD is poor circulation. In most cases, PVD narrows the blood vessels that carry blood from your heart to the rest of your body. This can result in a decreased supply of blood to your arms, legs, and internal organs, like your stomach or kidneys. However, it most often affects a person's lower legs and feet. There are two types of PVD.  Organic PVD. This is the more common type. It is caused by damage to the structure of blood vessels.  Functional PVD. This is caused by conditions that make blood vessels contract and tighten (spasm). Without treatment, PVD tends to get worse over time. PVD can also lead to acute ischemic limb. This is when an arm or limb suddenly has trouble getting enough blood. This is a medical emergency. CAUSES Each type of PVD has many different causes. The most common cause of PVD is buildup of a fatty material (plaque) inside of your arteries (atherosclerosis). Small amounts of plaque can break off from the walls of the blood vessels and become lodged in a smaller artery. This blocks blood flow and can cause acute ischemic limb. Other common causes of PVD include:  Blood clots that form inside of blood vessels.  Injuries to blood vessels.  Diseases that cause inflammation of blood vessels or cause blood vessel spasms.  Health behaviors and health history that increase your risk of developing PVD. RISK FACTORS  You may have a greater risk of PVD if you:  Have a family history of PVD.  Have certain medical conditions, including:  High cholesterol.  Diabetes.  High blood pressure (hypertension).  Coronary heart disease.  Past problems with blood clots.  Past injury, such as burns or a broken bone. These may have damaged blood vessels in your limbs.  Buerger disease. This is caused by inflamed blood  vessels in your hands and feet.  Some forms of arthritis.  Rare birth defects that affect the arteries in your legs.  Use tobacco.  Do not get enough exercise.  Are obese.  Are age 50 or older. SIGNS AND SYMPTOMS  PVD may cause many different symptoms. Your symptoms depend on what part of your body is not getting enough blood. Some common signs and symptoms include:  Cramps in your lower legs. This may be a symptom of poor leg circulation (claudication).  Pain and weakness in your legs while you are physically active that goes away when you rest (intermittent claudication).  Leg pain when at rest.  Leg numbness, tingling, or weakness.  Coldness in a leg or foot, especially when compared with the other leg.  Skin or hair changes. These can include:  Hair loss.  Shiny skin.  Pale or bluish skin.  Thick toenails.  Inability to get or maintain an erection (erectile dysfunction). People with PVD are more prone to developing ulcers and sores on their toes, feet, or legs. These may take longer than normal to heal. DIAGNOSIS Your health care provider may diagnose PVD from your signs and symptoms. The health care provider will also do a physical exam. You may have tests to find out what is causing your PVD and determine its severity. Tests may include:  Blood pressure recordings from your arms and legs and measurements of the strength of your pulses (pulse volume recordings).  Imaging studies using sound waves to take pictures of   the blood flow through your blood vessels (Doppler ultrasound).  Injecting a dye into your blood vessels before having imaging studies using:  X-rays (angiogram or arteriogram).  Computer-generated X-rays (CT angiogram).  A powerful electromagnetic field and a computer (magnetic resonance angiogram or MRA). TREATMENT Treatment for PVD depends on the cause of your condition and the severity of your symptoms. It also depends on your age. Underlying  causes need to be treated and controlled. These include long-lasting (chronic) conditions, such as diabetes, high cholesterol, and high blood pressure. You may need to first try making lifestyle changes and taking medicines. Surgery may be needed if these do not work. Lifestyle changes may include:  Quitting smoking.  Exercising regularly.  Following a low-fat, low-cholesterol diet. Medicines may include:  Blood thinners to prevent blood clots.  Medicines to improve blood flow.  Medicines to improve your blood cholesterol levels. Surgical procedures may include:  A procedure that uses an inflated balloon to open a blocked artery and improve blood flow (angioplasty).  A procedure to put in a tube (stent) to keep a blocked artery open (stent implant).  Surgery to reroute blood flow around a blocked artery (peripheral bypass surgery).  Surgery to remove dead tissue from an infected wound on the affected limb.  Amputation. This is surgical removal of the affected limb. This may be necessary in cases of acute ischemic limb that are not improved through medical or surgical treatments. HOME CARE INSTRUCTIONS  Take medicines only as directed by your health care provider.  Do not use any tobacco products, including cigarettes, chewing tobacco, or electronic cigarettes. If you need help quitting, ask your health care provider.  Lose weight if you are overweight, and maintain a healthy weight as directed by your health care provider.  Eat a diet that is low in fat and cholesterol. If you need help, ask your health care provider.  Exercise regularly. Ask your health care provider to suggest some good activities for you.  Use compression stockings or other mechanical devices as directed by your health care provider.  Take good care of your feet.  Wear comfortable shoes that fit well.  Check your feet often for any cuts or sores. SEEK MEDICAL CARE IF:  You have cramps in your legs  while walking.  You have leg pain when you are at rest.  You have coldness in a leg or foot.  Your skin changes.  You have erectile dysfunction.  You have cuts or sores on your feet that are not healing. SEEK IMMEDIATE MEDICAL CARE IF:  Your arm or leg turns cold and blue.  Your arms or legs become red, warm, swollen, painful, or numb.  You have chest pain or trouble breathing.  You suddenly have weakness in your face, arm, or leg.  You become very confused or lose the ability to speak.  You suddenly have a very bad headache or lose your vision.   This information is not intended to replace advice given to you by your health care provider. Make sure you discuss any questions you have with your health care provider.   Document Released: 03/25/2004 Document Revised: 03/08/2014 Document Reviewed: 07/26/2013 Elsevier Interactive Patient Education 2016 [employer].    Steps to Quit Smoking  Smoking tobacco can be harmful to your health and can affect almost every organ in your body. Smoking puts you, and those around you, at risk for developing many serious chronic diseases. Quitting smoking is difficult, but it is one of the  best things that you can do for your health. It is never too late to quit. WHAT ARE THE BENEFITS OF QUITTING SMOKING? When you quit smoking, you lower your risk of developing serious diseases and conditions, such as:  Lung cancer or lung disease, such as COPD.  Heart disease.  Stroke.  Heart attack.  Infertility.  Osteoporosis and bone fractures. Additionally, symptoms such as coughing, wheezing, and shortness of breath may get better when you quit. You may also find that you get sick less often because your body is stronger at fighting off colds and infections. If you are pregnant, quitting smoking can help to reduce your chances of having a baby of low birth weight. HOW DO I GET READY TO QUIT? When you decide to quit smoking, create a plan to  make sure that you are successful. Before you quit:  Pick a date to quit. Set a date within the next two weeks to give you time to prepare.  Write down the reasons why you are quitting. Keep this list in places where you will see it often, such as on your bathroom mirror or in your car or wallet.  Identify the people, places, things, and activities that make you want to smoke (triggers) and avoid them. Make sure to take these actions:  Throw away all cigarettes at home, at work, and in your car.  Throw away smoking accessories, such as Scientist, research (medical).  Clean your car and make sure to empty the ashtray.  Clean your home, including curtains and carpets.  Tell your family, friends, and coworkers that you are quitting. Support from your loved ones can make quitting easier.  Talk with your health care provider about your options for quitting smoking.  Find out what treatment options are covered by your health insurance. WHAT STRATEGIES CAN I USE TO QUIT SMOKING?  Talk with your healthcare provider about different strategies to quit smoking. Some strategies include:  Quitting smoking altogether instead of gradually lessening how much you smoke over a period of time. Research shows that quitting "cold [place]" is more successful than gradually quitting.  Attending in-person counseling to help you build problem-solving skills. You are more likely to have success in quitting if you attend several counseling sessions. Even short sessions of 10 minutes can be effective.  Finding resources and support systems that can help you to quit smoking and remain smoke-free after you quit. These resources are most helpful when you use them often. They can include:  Online chats with a Social worker.  Telephone quitlines.  Printed Furniture conservator/restorer.  Support groups or group counseling.  Text messaging programs.  Mobile phone applications.  Taking medicines to help you quit smoking. (If you are  pregnant or breastfeeding, talk with your health care provider first.) Some medicines contain nicotine and some do not. Both types of medicines help with cravings, but the medicines that include nicotine help to relieve withdrawal symptoms. Your health care provider may recommend:  Nicotine patches, gum, or lozenges.  Nicotine inhalers or sprays.  Non-nicotine medicine that is taken by mouth. Talk with your health care provider about combining strategies, such as taking medicines while you are also receiving in-person counseling. Using these two strategies together makes you more likely to succeed in quitting than if you used either strategy on its own. If you are pregnant or breastfeeding, talk with your health care provider about finding counseling or other support strategies to quit smoking. Do not take medicine to help you quit  smoking unless told to do so by your health care provider. WHAT THINGS CAN I DO TO MAKE IT EASIER TO QUIT? Quitting smoking might feel overwhelming at first, but there is a lot that you can do to make it easier. Take these important actions:  Reach out to your family and friends and ask that they support and encourage you during this time. Call telephone quitlines, reach out to support groups, or work with a counselor for support.  Ask people who smoke to avoid smoking around you.  Avoid places that trigger you to smoke, such as bars, parties, or smoke-break areas at work.  Spend time around people who do not smoke.  Lessen stress in your life, because stress can be a smoking trigger for some people. To lessen stress, try:  Exercising regularly.  Deep-breathing exercises.  Yoga.  Meditating.  Performing a body scan. This involves closing your eyes, scanning your body from head to toe, and noticing which parts of your body are particularly tense. Purposefully relax the muscles in those areas.  Download or purchase mobile phone or tablet apps (applications)  that can help you stick to your quit plan by providing reminders, tips, and encouragement. There are many free apps, such as QuitGuide from the [employer] Office manager for Disease Control and Prevention). You can find other support for quitting smoking (smoking cessation) through smokefree.gov and other websites. HOW WILL I FEEL WHEN I QUIT SMOKING? Within the first 24 hours of quitting smoking, you may start to feel some withdrawal symptoms. These symptoms are usually most noticeable 2-3 days after quitting, but they usually do not last beyond 2-3 weeks. Changes or symptoms that you might experience include:  Mood swings.  Restlessness, anxiety, or irritation.  Difficulty concentrating.  Dizziness.  Strong cravings for sugary foods in addition to nicotine.  Mild weight gain.  Constipation.  Nausea.  Coughing or a sore throat.  Changes in how your medicines work in your body.  A depressed mood.  Difficulty sleeping (insomnia). After the first 2-3 weeks of quitting, you may start to notice more positive results, such as:  Improved sense of smell and taste.  Decreased coughing and sore throat.  Slower heart rate.  Lower blood pressure.  Clearer skin.  The ability to breathe more easily.  Fewer sick days. Quitting smoking is very challenging for most people. Do not get discouraged if you are not successful the first time. Some people need to make many attempts to quit before they achieve long-term success. Do your best to stick to your quit plan, and talk with your health care provider if you have any questions or concerns.   This information is not intended to replace advice given to you by your health care provider. Make sure you discuss any questions you have with your health care provider.   Document Released: 02/09/2001 Document Revised: 07/02/2014 Document Reviewed: 07/02/2014 Elsevier Interactive Patient Education [employer].    Before your next abdominal  ultrasound:  Take two Extra-Strength Gas-X capsules at bedtime the night before the test. Take another two Extra-Strength Gas-X capsules 3 hours before the test.

## 2015-08-12 DIAGNOSIS — E782 Mixed hyperlipidemia: Secondary | ICD-10-CM | POA: Diagnosis not present

## 2015-08-12 DIAGNOSIS — E039 Hypothyroidism, unspecified: Secondary | ICD-10-CM | POA: Diagnosis not present

## 2015-08-12 DIAGNOSIS — I7389 Other specified peripheral vascular diseases: Secondary | ICD-10-CM | POA: Diagnosis not present

## 2015-08-19 DIAGNOSIS — E039 Hypothyroidism, unspecified: Secondary | ICD-10-CM | POA: Diagnosis not present

## 2015-08-19 DIAGNOSIS — Z0001 Encounter for general adult medical examination with abnormal findings: Secondary | ICD-10-CM | POA: Diagnosis not present

## 2015-09-25 NOTE — Addendum Note (Signed)
Addended by: Thresa Ross C on: 09/25/2015 11:25 AM   Modules accepted: Orders

## 2016-02-17 DIAGNOSIS — E782 Mixed hyperlipidemia: Secondary | ICD-10-CM | POA: Diagnosis not present

## 2016-02-17 DIAGNOSIS — G6289 Other specified polyneuropathies: Secondary | ICD-10-CM | POA: Diagnosis not present

## 2016-02-17 DIAGNOSIS — E039 Hypothyroidism, unspecified: Secondary | ICD-10-CM | POA: Diagnosis not present

## 2016-02-17 DIAGNOSIS — E1165 Type 2 diabetes mellitus with hyperglycemia: Secondary | ICD-10-CM | POA: Diagnosis not present

## 2016-02-20 DIAGNOSIS — E039 Hypothyroidism, unspecified: Secondary | ICD-10-CM | POA: Diagnosis not present

## 2016-02-20 DIAGNOSIS — I739 Peripheral vascular disease, unspecified: Secondary | ICD-10-CM | POA: Diagnosis not present

## 2016-02-20 DIAGNOSIS — G6289 Other specified polyneuropathies: Secondary | ICD-10-CM | POA: Diagnosis not present

## 2016-02-20 DIAGNOSIS — Z23 Encounter for immunization: Secondary | ICD-10-CM | POA: Diagnosis not present

## 2016-08-12 ENCOUNTER — Other Ambulatory Visit (HOSPITAL_COMMUNITY): Payer: Medicare Other

## 2016-08-12 ENCOUNTER — Ambulatory Visit: Payer: Medicare Other | Admitting: Family

## 2016-08-12 ENCOUNTER — Encounter (HOSPITAL_COMMUNITY): Payer: Medicare Other

## 2016-09-06 DIAGNOSIS — E039 Hypothyroidism, unspecified: Secondary | ICD-10-CM | POA: Diagnosis not present

## 2016-09-06 DIAGNOSIS — E782 Mixed hyperlipidemia: Secondary | ICD-10-CM | POA: Diagnosis not present

## 2016-09-06 DIAGNOSIS — E1165 Type 2 diabetes mellitus with hyperglycemia: Secondary | ICD-10-CM | POA: Diagnosis not present

## 2016-09-06 DIAGNOSIS — N183 Chronic kidney disease, stage 3 (moderate): Secondary | ICD-10-CM | POA: Diagnosis not present

## 2016-09-08 ENCOUNTER — Encounter: Payer: Self-pay | Admitting: Family

## 2016-09-09 DIAGNOSIS — I7389 Other specified peripheral vascular diseases: Secondary | ICD-10-CM | POA: Diagnosis not present

## 2016-09-09 DIAGNOSIS — Z0001 Encounter for general adult medical examination with abnormal findings: Secondary | ICD-10-CM | POA: Diagnosis not present

## 2016-09-16 ENCOUNTER — Ambulatory Visit (HOSPITAL_COMMUNITY)
Admission: RE | Admit: 2016-09-16 | Discharge: 2016-09-16 | Disposition: A | Discharge status: Home / Self Care | Payer: Medicare Other | Source: Ambulatory Visit | Attending: Vascular Surgery | Admitting: Vascular Surgery

## 2016-09-16 ENCOUNTER — Ambulatory Visit (INDEPENDENT_AMBULATORY_CARE_PROVIDER_SITE_OTHER): Payer: Medicare Other | Admitting: Family

## 2016-09-16 ENCOUNTER — Encounter: Payer: Self-pay | Admitting: Family

## 2016-09-16 VITALS — BP 137/78 | HR 65 | Temp 97.0°F | Resp 18 | Ht 70.0 in | Wt 184.0 lb

## 2016-09-16 DIAGNOSIS — I724 Aneurysm of artery of lower extremity: Secondary | ICD-10-CM

## 2016-09-16 DIAGNOSIS — Y838 Other surgical procedures as the cause of abnormal reaction of the patient, or of later complication, without mention of misadventure at the time of the procedure: Secondary | ICD-10-CM | POA: Diagnosis not present

## 2016-09-16 DIAGNOSIS — T82898D Other specified complication of vascular prosthetic devices, implants and grafts, subsequent encounter: Secondary | ICD-10-CM | POA: Diagnosis not present

## 2016-09-16 DIAGNOSIS — I714 Abdominal aortic aneurysm, without rupture, unspecified: Secondary | ICD-10-CM

## 2016-09-16 DIAGNOSIS — I779 Disorder of arteries and arterioles, unspecified: Secondary | ICD-10-CM

## 2016-09-16 DIAGNOSIS — Z95828 Presence of other vascular implants and grafts: Secondary | ICD-10-CM | POA: Insufficient documentation

## 2016-09-16 DIAGNOSIS — I739 Peripheral vascular disease, unspecified: Secondary | ICD-10-CM | POA: Insufficient documentation

## 2016-09-16 DIAGNOSIS — F17200 Nicotine dependence, unspecified, uncomplicated: Secondary | ICD-10-CM

## 2016-09-16 NOTE — Progress Notes (Signed)
[facility] [place]  CC: Follow up s/p Endovascular Repair of Abdominal Aortic Aneurysm and PAOD   History of Present Illness  Terry Boyd is a 76 y.o. (16-Apr-1940) male  patient of Dr. Oneida Alar who who returns today for follow-up for peripheral arterial disease and duplex evaluation s/p EVAR in 2011. He previously had an above-knee to below-knee popliteal bypass in 2014 for popliteal artery aneurysm. This occluded in October 2016.  He still has some claudication symptoms in the left calf but he states that currently this is tolerable and is actually improving. nonhealing wounds.   ABIs October 2016 were 0.55 on the left 1.1 on the right.   In the 1970's he was told that he had a small fracture in one of his lumbar vertabra and that the spine is "closing up on it". He denies claudication symptoms with walking on flat terrain, but walking on an incline his left leg feels weak, followed by right leg weakness, followed by dyspnea, denies non healing wounds He has had extensive evaluation of mid epigastric abdominal pain only with deep palpation, states no abnormalities found. He denies back pain.  Pt denies any history of stroke or TIA, denies any cardiac problems.  Pt Diabetic: No Pt smoker: smoker (1 ppd, started at age 89 yrs)  Pt meds include: Statin : no, caused leg cramps ASA: Yes Other anticoagulants/antiplatelets: no   Past Medical History:  Diagnosis Date  . AAA (abdominal aortic aneurysm) ([place])   . Arthritis   . Chronic back pain   . COPD (chronic obstructive pulmonary disease) ([place])   . Depression   . Hypertension   . Hypothyroidism   . Smoker   . Thyroid disease    Past Surgical History:  Procedure Laterality Date  . ABDOMINAL AORTAGRAM N/A 03/14/2012   Procedure: ABDOMINAL Maxcine Ham;  Surgeon: Serafina Mitchell, MD;  Location: [facility] CATH LAB;  Service: Cardiovascular;  Laterality: N/A;  . ABDOMINAL AORTIC ANEURYSM REPAIR  04-22-2009   Stent  graft repair of AAA  . APPENDECTOMY    . BYPASS GRAFT POPLITEAL TO POPLITEAL  03/20/2012   Procedure: BYPASS GRAFT POPLITEAL TO POPLITEAL;  Surgeon: Elam Dutch, MD;  Location: [place];  Service: Vascular;  Laterality: Left;  ABOVE THE KNEE POPLITEAL ARTERY TO BELOW THE KNEE POPLITEAL ARTERY BYPASS GRAFT USING REVERSE LEFT GREATER SAPHENOUS VEIN   . ENDARTERECTOMY POPLITEAL  03/20/2012   Procedure: ENDARTERECTOMY POPLITEAL;  Surgeon: Elam Dutch, MD;  Location: [facility] OR;  Service: Vascular;  Laterality: Left;   LIGATION OF LEFT LEG POPLITEAL ANEURYSM  . INTRAOPERATIVE ARTERIOGRAM  03/20/2012   Procedure: INTRA OPERATIVE ARTERIOGRAM;  Surgeon: Elam Dutch, MD;  Location: [place];  Service: Vascular;  Laterality: Left;  TIMES ONE  . VASECTOMY  1982   Social History Social History  Substance Use Topics  . Smoking status: Current Every Day Smoker    Years: 57.00    Types: Cigars  . Smokeless tobacco: Never Used     Comment: pt states he smokes about 20 cigars per day  . Alcohol use No   Family History Family History  Problem Relation Age of Onset  . Diabetes Mother   . Heart disease Father   . Diabetes Father   . Heart disease Sister        CABG x 4  . Cancer Brother        throat cancer   Current Outpatient Prescriptions on File Prior to Visit  Medication  Sig Dispense Refill  . aspirin EC 325 MG tablet Take 1 tablet (325 mg total) by mouth daily. 100 tablet 3  . levothyroxine (SYNTHROID, LEVOTHROID) 125 MCG tablet Take 150 mcg by mouth daily.     Marland Kitchen levothyroxine (SYNTHROID, LEVOTHROID) 150 MCG tablet daily.    Marland Kitchen LORazepam (ATIVAN) 1 MG tablet Take 1 mg by mouth at bedtime as needed. sleep    . oxyCODONE (OXY IR/ROXICODONE) 5 MG immediate release tablet Take 1-2 tablets (5-10 mg total) by mouth every 4 (four) hours as needed. 30 tablet 0  . potassium chloride (K-DUR) 10 MEQ tablet Take 10 mEq by mouth daily.    . sertraline (ZOLOFT) 50 MG tablet Take 50 mg by mouth daily.      No current facility-administered medications on file prior to visit.    Allergies  Allergen Reactions  . Morphine And Related Shortness Of Breath and Other (See Comments)    hallucinations     ROS: See HPI for pertinent positives and negatives.  Physical Examination  Vitals:   09/16/16 1107  BP: 137/78  Pulse: 65  Resp: 18  Temp: (!) 97 F (36.1 C)  TempSrc: Oral  SpO2: 93%  Weight: 184 lb (83.5 kg)  Height: 5\' 10"  (1.778 m)   Body mass index is 26.4 kg/m.  General: WDWN in NAD Gait: Normal HENT: WNL Eyes: Pupils equal Pulmonary: normal non-labored breathing, without rales or rhonchi,no wheezes, minimal air movement in all fields, occasional dry cough. Cardiac: RRR, no murmur detected  Abdomen: soft, NT, reducible ventral hernia. Skin: no rashes, no ulcers, no cellulitis.   VASCULAR EXAM  Carotid Bruits Right Left   Negative Negative   Radial pulses are 2+ palpable and = Abdominal aortic pulse is not palpable   VASCULAR EXAM: Extremities without ischemic changes  without Gangrene; without open wounds.      LE Pulses Right Left   FEMORAL 2+ palpable 3+ palpable    POPLITEAL nott palpable  not palpable   POSTERIOR TIBIAL 2+ palpable  not palpable    DORSALIS PEDIS  ANTERIOR TIBIAL not palpable  not palpable     Musculoskeletal: no muscle wasting or atrophy; moderate kyphosis, no LE edema Neurologic: A&O X 3; Appropriate Affect ; SENSATION: normal; MOTOR FUNCTION: 5/5 Symmetric, CN 2-12 intact, Speech is fluent/normal   Non-Invasive Vascular Imaging  EVAR Duplex (Date: 09-16-16)  AAA sac size: 3.3 cm; Right CIA: 1.5 cm; Left CIA: 1.2 cm  no endoleak detected  Previous AAA sac size (  08-08-15): 3 cm   ABI (Date: 09/16/2016)  R:   ABI: 1.05 (was 1.07 on 08-08-15),   PT: tri  DP: bi  TBI: 0.60 (was 0.59)   L:   ABI: 0.64 (was 0.67),   PT: mono  DP: mono  TBI: 0.46 (was 0.43) Right ABI remains normal with tri and biphasic waveforms, left remains stable with moderate arterial occlusive disease.    Medical Decision Making  Terry Boyd is a 76 y.o. male who presents s/p EVAR (Date: 04/22/2009).  Pt is asymptomatic with stable sac size. He previously had an above-knee to below-knee popliteal bypass in 2014 for popliteal artery aneurysm. This occluded in October 2016.  His left calf claudication is only on inclines.   The patient was counseled re smoking cessation and given several free resources re smoking cessation.   I discussed with the patient the importance of surveillance of the endograft.   The next endograft duplex will be scheduled for 12  months, will also check ABI's.  The patient will follow up with [place] in 12 months with these studies.  I emphasized the importance of maximal medical management including strict control of blood pressure, blood glucose, and lipid levels, antiplatelet agents, obtaining regular exercise, and cessation of smoking.   Thank you for allowing [place] to participate in this patient's care.  Clemon Chambers, RN, MSN, FNP-C [facility] [place] Office: [facility] Physician: Oneida Alar  09/16/2016, 11:22 AM

## 2016-09-16 NOTE — Patient Instructions (Signed)
Steps to Quit Smoking Smoking tobacco can be bad for your health. It can also affect almost every organ in your body. Smoking puts you and people around you at risk for many serious long-lasting (chronic) diseases. Quitting smoking is hard, but it is one of the best things that you can do for your health. It is never too late to quit. What are the benefits of quitting smoking? When you quit smoking, you lower your risk for getting serious diseases and conditions. They can include:  Lung cancer or lung disease.  Heart disease.  Stroke.  Heart attack.  Not being able to have children (infertility).  Weak bones (osteoporosis) and broken bones (fractures).  If you have coughing, wheezing, and shortness of breath, those symptoms may get better when you quit. You may also get sick less often. If you are pregnant, quitting smoking can help to lower your chances of having a baby of low birth weight. What can I do to help me quit smoking? Talk with your doctor about what can help you quit smoking. Some things you can do (strategies) include:  Quitting smoking totally, instead of slowly cutting back how much you smoke over a period of time.  Going to in-person counseling. You are more likely to quit if you go to many counseling sessions.  Using resources and support systems, such as: ? Online chats with a counselor. ? Phone quitlines. ? Printed self-help materials. ? Support groups or group counseling. ? Text messaging programs. ? Mobile phone apps or applications.  Taking medicines. Some of these medicines may have nicotine in them. If you are pregnant or breastfeeding, do not take any medicines to quit smoking unless your doctor says it is okay. Talk with your doctor about counseling or other things that can help you.  Talk with your doctor about using more than one strategy at the same time, such as taking medicines while you are also going to in-person counseling. This can help make  quitting easier. What things can I do to make it easier to quit? Quitting smoking might feel very hard at first, but there is a lot that you can do to make it easier. Take these steps:  Talk to your family and friends. Ask them to support and encourage you.  Call phone quitlines, reach out to support groups, or work with a counselor.  Ask people who smoke to not smoke around you.  Avoid places that make you want (trigger) to smoke, such as: ? Bars. ? Parties. ? Smoke-break areas at work.  Spend time with people who do not smoke.  Lower the stress in your life. Stress can make you want to smoke. Try these things to help your stress: ? Getting regular exercise. ? Deep-breathing exercises. ? Yoga. ? Meditating. ? Doing a body scan. To do this, close your eyes, focus on one area of your body at a time from head to toe, and notice which parts of your body are tense. Try to relax the muscles in those areas.  Download or buy apps on your mobile phone or tablet that can help you stick to your quit plan. There are many free apps, such as QuitGuide from the CDC (Centers for Disease Control and Prevention). You can find more support from smokefree.gov and other websites.  This information is not intended to replace advice given to you by your health care provider. Make sure you discuss any questions you have with your health care provider. Document Released: 12/12/2008 Document   Revised: 10/14/2015 Document Reviewed: 07/02/2014 Elsevier Interactive Patient Education  2018 Elsevier Inc.     Peripheral Vascular Disease Peripheral vascular disease (PVD) is a disease of the blood vessels that are not part of your heart and brain. A simple term for PVD is poor circulation. In most cases, PVD narrows the blood vessels that carry blood from your heart to the rest of your body. This can result in a decreased supply of blood to your arms, legs, and internal organs, like your stomach or kidneys.  However, it most often affects a person's lower legs and feet. There are two types of PVD.  Organic PVD. This is the more common type. It is caused by damage to the structure of blood vessels.  Functional PVD. This is caused by conditions that make blood vessels contract and tighten (spasm).  Without treatment, PVD tends to get worse over time. PVD can also lead to acute ischemic limb. This is when an arm or limb suddenly has trouble getting enough blood. This is a medical emergency. Follow these instructions at home:  Take medicines only as told by your doctor.  Do not use any tobacco products, including cigarettes, chewing tobacco, or electronic cigarettes. If you need help quitting, ask your doctor.  Lose weight if you are overweight, and maintain a healthy weight as told by your doctor.  Eat a diet that is low in fat and cholesterol. If you need help, ask your doctor.  Exercise regularly. Ask your doctor for some good activities for you.  Take good care of your feet. ? Wear comfortable shoes that fit well. ? Check your feet often for any cuts or sores. Contact a doctor if:  You have cramps in your legs while walking.  You have leg pain when you are at rest.  You have coldness in a leg or foot.  Your skin changes.  You are unable to get or have an erection (erectile dysfunction).  You have cuts or sores on your feet that are not healing. Get help right away if:  Your arm or leg turns cold and blue.  Your arms or legs become red, warm, swollen, painful, or numb.  You have chest pain or trouble breathing.  You suddenly have weakness in your face, arm, or leg.  You become very confused or you cannot speak.  You suddenly have a very bad headache.  You suddenly cannot see. This information is not intended to replace advice given to you by your health care provider. Make sure you discuss any questions you have with your health care provider. Document Released:  05/12/2009 Document Revised: 07/24/2015 Document Reviewed: 07/26/2013 Elsevier Interactive Patient Education  2017 Elsevier Inc.  

## 2016-09-30 NOTE — Addendum Note (Signed)
Addended by: Lianne Cure A on: 09/30/2016 09:27 AM   Modules accepted: Orders

## 2017-01-06 DIAGNOSIS — Z23 Encounter for immunization: Secondary | ICD-10-CM | POA: Diagnosis not present

## 2017-03-01 DIAGNOSIS — C3490 Malignant neoplasm of unspecified part of unspecified bronchus or lung: Secondary | ICD-10-CM

## 2017-03-01 HISTORY — DX: Malignant neoplasm of unspecified part of unspecified bronchus or lung: C34.90

## 2017-03-10 DIAGNOSIS — E039 Hypothyroidism, unspecified: Secondary | ICD-10-CM | POA: Diagnosis not present

## 2017-03-10 DIAGNOSIS — E1165 Type 2 diabetes mellitus with hyperglycemia: Secondary | ICD-10-CM | POA: Diagnosis not present

## 2017-03-10 DIAGNOSIS — E782 Mixed hyperlipidemia: Secondary | ICD-10-CM | POA: Diagnosis not present

## 2017-03-14 DIAGNOSIS — E039 Hypothyroidism, unspecified: Secondary | ICD-10-CM | POA: Diagnosis not present

## 2017-03-14 DIAGNOSIS — E1165 Type 2 diabetes mellitus with hyperglycemia: Secondary | ICD-10-CM | POA: Diagnosis not present

## 2017-03-14 DIAGNOSIS — E782 Mixed hyperlipidemia: Secondary | ICD-10-CM | POA: Diagnosis not present

## 2017-03-14 DIAGNOSIS — J431 Panlobular emphysema: Secondary | ICD-10-CM | POA: Diagnosis not present

## 2017-03-14 DIAGNOSIS — I7389 Other specified peripheral vascular diseases: Secondary | ICD-10-CM | POA: Diagnosis not present

## 2017-09-08 DIAGNOSIS — Z1389 Encounter for screening for other disorder: Secondary | ICD-10-CM | POA: Diagnosis not present

## 2017-09-08 DIAGNOSIS — J449 Chronic obstructive pulmonary disease, unspecified: Secondary | ICD-10-CM | POA: Diagnosis not present

## 2017-09-08 DIAGNOSIS — J431 Panlobular emphysema: Secondary | ICD-10-CM | POA: Diagnosis not present

## 2017-09-08 DIAGNOSIS — I739 Peripheral vascular disease, unspecified: Secondary | ICD-10-CM | POA: Diagnosis not present

## 2017-09-08 DIAGNOSIS — G6289 Other specified polyneuropathies: Secondary | ICD-10-CM | POA: Diagnosis not present

## 2017-09-08 DIAGNOSIS — E039 Hypothyroidism, unspecified: Secondary | ICD-10-CM | POA: Diagnosis not present

## 2017-09-20 DIAGNOSIS — K5790 Diverticulosis of intestine, part unspecified, without perforation or abscess without bleeding: Secondary | ICD-10-CM | POA: Diagnosis not present

## 2017-09-20 DIAGNOSIS — K5900 Constipation, unspecified: Secondary | ICD-10-CM | POA: Diagnosis not present

## 2017-09-20 DIAGNOSIS — Z79899 Other long term (current) drug therapy: Secondary | ICD-10-CM | POA: Diagnosis not present

## 2017-09-20 DIAGNOSIS — E039 Hypothyroidism, unspecified: Secondary | ICD-10-CM | POA: Diagnosis not present

## 2017-09-20 DIAGNOSIS — R0602 Shortness of breath: Secondary | ICD-10-CM | POA: Diagnosis not present

## 2017-09-20 DIAGNOSIS — G629 Polyneuropathy, unspecified: Secondary | ICD-10-CM | POA: Diagnosis not present

## 2017-09-20 DIAGNOSIS — N390 Urinary tract infection, site not specified: Secondary | ICD-10-CM | POA: Diagnosis not present

## 2017-09-20 DIAGNOSIS — K5730 Diverticulosis of large intestine without perforation or abscess without bleeding: Secondary | ICD-10-CM | POA: Diagnosis not present

## 2017-09-22 ENCOUNTER — Other Ambulatory Visit: Payer: Self-pay

## 2017-09-22 ENCOUNTER — Ambulatory Visit: Payer: Medicare Other | Admitting: Family

## 2017-09-22 ENCOUNTER — Ambulatory Visit (HOSPITAL_COMMUNITY)
Admission: RE | Admit: 2017-09-22 | Discharge: 2017-09-22 | Disposition: A | Discharge status: Home / Self Care | Payer: Medicare Other | Source: Ambulatory Visit | Attending: Vascular Surgery | Admitting: Vascular Surgery

## 2017-09-22 ENCOUNTER — Encounter: Payer: Self-pay | Admitting: Family

## 2017-09-22 ENCOUNTER — Ambulatory Visit (INDEPENDENT_AMBULATORY_CARE_PROVIDER_SITE_OTHER)
Admission: RE | Admit: 2017-09-22 | Discharge: 2017-09-22 | Disposition: A | Discharge status: Home / Self Care | Payer: Medicare Other | Source: Ambulatory Visit | Attending: Vascular Surgery | Admitting: Vascular Surgery

## 2017-09-22 VITALS — BP 112/65 | HR 62 | Temp 97.6°F | Resp 16 | Ht 70.0 in | Wt 160.2 lb

## 2017-09-22 DIAGNOSIS — T82898D Other specified complication of vascular prosthetic devices, implants and grafts, subsequent encounter: Secondary | ICD-10-CM | POA: Diagnosis not present

## 2017-09-22 DIAGNOSIS — I714 Abdominal aortic aneurysm, without rupture, unspecified: Secondary | ICD-10-CM

## 2017-09-22 DIAGNOSIS — F17200 Nicotine dependence, unspecified, uncomplicated: Secondary | ICD-10-CM | POA: Diagnosis present

## 2017-09-22 DIAGNOSIS — I779 Disorder of arteries and arterioles, unspecified: Secondary | ICD-10-CM | POA: Diagnosis not present

## 2017-09-22 DIAGNOSIS — Z95828 Presence of other vascular implants and grafts: Secondary | ICD-10-CM

## 2017-09-22 DIAGNOSIS — I724 Aneurysm of artery of lower extremity: Secondary | ICD-10-CM | POA: Diagnosis not present

## 2017-09-22 DIAGNOSIS — I70702 Unspecified atherosclerosis of other type of bypass graft(s) of the extremities, left leg: Secondary | ICD-10-CM | POA: Insufficient documentation

## 2017-09-22 NOTE — Progress Notes (Signed)
[facility] [place]  CC: Follow up s/p Endovascular Repair of Abdominal Aortic Aneurysm     History of Present Illness  Terry Boyd is a 77 y.o. (1940/04/14) male who returns today for follow-up for peripheral arterial disease and duplex evaluation s/p EVAR in 2011 by Dr. Oneida Alar.  He previously had an above-knee to below-knee popliteal bypass in 2014 for popliteal artery aneurysm. This occluded in October 2016.  He still has some claudication symptoms in the left calf but he states that currently this is tolerable and is actually improving. nonhealing wounds.  ABIs October 2016 were 0.55 on the left 1.1 on the right.   He is having a GI work up for abdominal pain, early satiety, constipation.  He lost 24 pounds in a year.  He has abdominal pain all the time, feels like his food gets hung up about diaphragm area.  He states he has had 2 CT's of his abdomen, one at the [place] and one at [facility] in [facility].   In the 1970's he was told that he had a small fracture in one of his lumbar vertabra and that the spine is "closing up on it". He denies claudication symptoms with walking on flat terrain, but walking on an incline his left leg feels weak, followed by right leg weakness, followed by dyspnea, denies non healing wounds He has had extensive evaluation of mid epigastric abdominal pain only with deep palpation, states no abnormalities found. He denies back pain.  Pt denies any history of stroke or TIA, denies any cardiac problems.  Diabetic: No Tobacco use: smoker (3/4 ppd, started at age 86 yrs)  Pt meds include: Statin : no, caused leg cramps ASA: Yes Other anticoagulants/antiplatelets: no   Past Medical History:  Diagnosis Date  . AAA (abdominal aortic aneurysm) ([place])   . Arthritis   . Chronic back pain   . COPD (chronic obstructive pulmonary disease) ([place])   . Depression   . Hypertension   . Hypothyroidism   . Smoker   . Thyroid disease     Past Surgical History:  Procedure Laterality Date  . ABDOMINAL AORTAGRAM N/A 03/14/2012   Procedure: ABDOMINAL Maxcine Ham;  Surgeon: Serafina Mitchell, MD;  Location: [facility] CATH LAB;  Service: Cardiovascular;  Laterality: N/A;  . ABDOMINAL AORTIC ANEURYSM REPAIR  04-22-2009   Stent graft repair of AAA  . APPENDECTOMY    . BYPASS GRAFT POPLITEAL TO POPLITEAL  03/20/2012   Procedure: BYPASS GRAFT POPLITEAL TO POPLITEAL;  Surgeon: Elam Dutch, MD;  Location: [place];  Service: Vascular;  Laterality: Left;  ABOVE THE KNEE POPLITEAL ARTERY TO BELOW THE KNEE POPLITEAL ARTERY BYPASS GRAFT USING REVERSE LEFT GREATER SAPHENOUS VEIN   . ENDARTERECTOMY POPLITEAL  03/20/2012   Procedure: ENDARTERECTOMY POPLITEAL;  Surgeon: Elam Dutch, MD;  Location: [facility] OR;  Service: Vascular;  Laterality: Left;   LIGATION OF LEFT LEG POPLITEAL ANEURYSM  . INTRAOPERATIVE ARTERIOGRAM  03/20/2012   Procedure: INTRA OPERATIVE ARTERIOGRAM;  Surgeon: Elam Dutch, MD;  Location: [place];  Service: Vascular;  Laterality: Left;  TIMES ONE  . VASECTOMY  1982   Social History Social History   Tobacco Use  . Smoking status: Current Every Day Smoker    Years: 57.00    Types: Cigars  . Smokeless tobacco: Never Used  . Tobacco comment: pt states he smokes about 20 cigars per day  Substance Use Topics  . Alcohol use: No  . Drug use: No  Family History Family History  Problem Relation Age of Onset  . Diabetes Mother   . Heart disease Father   . Diabetes Father   . Heart disease Sister        CABG x 4  . Cancer Brother        throat cancer   Current Outpatient Medications on File Prior to Visit  Medication Sig Dispense Refill  . aspirin EC 325 MG tablet Take 1 tablet (325 mg total) by mouth daily. 100 tablet 3  . levothyroxine (SYNTHROID, LEVOTHROID) 125 MCG tablet Take 150 mcg by mouth daily.     Marland Kitchen levothyroxine (SYNTHROID, LEVOTHROID) 150 MCG tablet daily.    Marland Kitchen LORazepam (ATIVAN) 1 MG tablet Take 1 mg by mouth  at bedtime as needed. sleep    . oxyCODONE (OXY IR/ROXICODONE) 5 MG immediate release tablet Take 1-2 tablets (5-10 mg total) by mouth every 4 (four) hours as needed. 30 tablet 0  . potassium chloride (K-DUR) 10 MEQ tablet Take 10 mEq by mouth daily.    . sertraline (ZOLOFT) 50 MG tablet Take 50 mg by mouth daily.     No current facility-administered medications on file prior to visit.    Allergies  Allergen Reactions  . Morphine And Related Shortness Of Breath and Other (See Comments)    hallucinations     ROS: See HPI for pertinent positives and negatives.  Physical Examination  Vitals:   09/22/17 0938  BP: 112/65  Pulse: 62  Resp: 16  Temp: 97.6 F (36.4 C)  TempSrc: Oral  SpO2: 95%  Weight: 160 lb 3.2 oz (72.7 kg)  Height: 5\' 10"  (1.778 m)   Body mass index is 22.99 kg/m.  General:WDWN male in NAD Gait: Normal HENT: WNL Eyes: Pupils equal Pulmonary: normal non-labored breathing, without rales or rhonchi,+ transient wheezes, minimal air movement in all fields, occasional moist cough. Cardiac: RRR, no murmur detected Abdomen: soft, NT, reducible ventral hernia. Skin: no rashes, no ulcers, no cellulitis.   VASCULAR EXAM  Carotid Bruits Right Left   Negative Negative   Radial pulses are 2+ palpable and = Abdominal aortic pulse is not palpable   VASCULAR EXAM: Extremitieswithout ischemic changes  without Gangrene; without open wounds.      LE Pulses Right Left   FEMORAL 2+ palpable 2+ palpable    POPLITEAL 2+ palpable  not palpable   POSTERIOR TIBIAL 2+ palpable  not palpable    DORSALIS PEDIS  ANTERIOR TIBIAL not palpable  not palpable    Musculoskeletal: no muscle wasting or atrophy; moderate  kyphosis, no LE edema Neurologic:A&O X 3; Appropriate Affect ; SENSATION: normal; MOTOR FUNCTION: 5/5 Symmetric, CN 2-12 intact, Speech is fluent/normal Skin: No rashes, no ulcers, no cellulitis.   Psychiatric: Normal thought content, mood appropriate for clinical situation.     DATA  EVAR Duplex   Previous (Date: 09-16-16)  AAA sac size: 3.3 cm; Right CIA: 1.5 cm; Left CIA: 1.2 cm  no endoleak detected   Current (Date: 09-22-17)  AAA sac size: 3.0 cm; Right CIA: 1.2 cm; Left CIA: 1.4 cm  no endoleak detected   ABI (Date: 09/22/2017):  R:   ABI: 1.04 (was 1.05 on 09-16-16),   PT: tri  DP: tri  TBI:  0.47, toe pressure 56 (was 0.60)  L:   ABI: 0.62 (was 0.64),   PT: mono  DP: bi  TBI: 0.28, toes pressure 33 (was 0.46) Stable bilateral ABI with no disease in the right (triphasic waveforms) and  moderate disease in the left (bi and monophasic waveforms). Decline in bilateral TBI.     Medical Decision Making  Terry Boyd is a 77 y.o. male who presents s/p EVAR (Date: 04/22/2009).  Pt is asymptomatic with decreased sac size to 3 cm.  He previously had an above-knee to below-knee popliteal bypass in 2014 for popliteal artery aneurysm. This occluded in October 2016.   I discussed with the patient the importance of surveillance of the endograft.  The next endograft duplex will be scheduled for 12 months, will also check ABI's.   The patient will follow up with [place] in 12 months with these studies.  I emphasized the importance of maximal medical management including strict control of blood pressure, blood glucose, and lipid levels, antiplatelet agents, obtaining regular exercise, and cessation of smoking.   Thank you for allowing [place] to participate in this patient's care.  Clemon Chambers, RN, MSN, FNP-C [facility] [place] Office: [facility] Physician: Trula Slade  09/22/2017, 10:01 AM

## 2017-09-22 NOTE — Patient Instructions (Signed)
Steps to Quit Smoking Smoking tobacco can be bad for your health. It can also affect almost every organ in your body. Smoking puts you and people around you at risk for many serious long-lasting (chronic) diseases. Quitting smoking is hard, but it is one of the best things that you can do for your health. It is never too late to quit. What are the benefits of quitting smoking? When you quit smoking, you lower your risk for getting serious diseases and conditions. They can include:  Lung cancer or lung disease.  Heart disease.  Stroke.  Heart attack.  Not being able to have children (infertility).  Weak bones (osteoporosis) and broken bones (fractures).  If you have coughing, wheezing, and shortness of breath, those symptoms may get better when you quit. You may also get sick less often. If you are pregnant, quitting smoking can help to lower your chances of having a baby of low birth weight. What can I do to help me quit smoking? Talk with your doctor about what can help you quit smoking. Some things you can do (strategies) include:  Quitting smoking totally, instead of slowly cutting back how much you smoke over a period of time.  Going to in-person counseling. You are more likely to quit if you go to many counseling sessions.  Using resources and support systems, such as: ? Online chats with a counselor. ? Phone quitlines. ? Printed self-help materials. ? Support groups or group counseling. ? Text messaging programs. ? Mobile phone apps or applications.  Taking medicines. Some of these medicines may have nicotine in them. If you are pregnant or breastfeeding, do not take any medicines to quit smoking unless your doctor says it is okay. Talk with your doctor about counseling or other things that can help you.  Talk with your doctor about using more than one strategy at the same time, such as taking medicines while you are also going to in-person counseling. This can help make  quitting easier. What things can I do to make it easier to quit? Quitting smoking might feel very hard at first, but there is a lot that you can do to make it easier. Take these steps:  Talk to your family and friends. Ask them to support and encourage you.  Call phone quitlines, reach out to support groups, or work with a counselor.  Ask people who smoke to not smoke around you.  Avoid places that make you want (trigger) to smoke, such as: ? Bars. ? Parties. ? Smoke-break areas at work.  Spend time with people who do not smoke.  Lower the stress in your life. Stress can make you want to smoke. Try these things to help your stress: ? Getting regular exercise. ? Deep-breathing exercises. ? Yoga. ? Meditating. ? Doing a body scan. To do this, close your eyes, focus on one area of your body at a time from head to toe, and notice which parts of your body are tense. Try to relax the muscles in those areas.  Download or buy apps on your mobile phone or tablet that can help you stick to your quit plan. There are many free apps, such as QuitGuide from the CDC (Centers for Disease Control and Prevention). You can find more support from smokefree.gov and other websites.  This information is not intended to replace advice given to you by your health care provider. Make sure you discuss any questions you have with your health care provider. Document Released: 12/12/2008 Document   Revised: 10/14/2015 Document Reviewed: 07/02/2014 Elsevier Interactive Patient Education  2018 Elsevier Inc.     Peripheral Vascular Disease Peripheral vascular disease (PVD) is a disease of the blood vessels that are not part of your heart and brain. A simple term for PVD is poor circulation. In most cases, PVD narrows the blood vessels that carry blood from your heart to the rest of your body. This can result in a decreased supply of blood to your arms, legs, and internal organs, like your stomach or kidneys.  However, it most often affects a person's lower legs and feet. There are two types of PVD.  Organic PVD. This is the more common type. It is caused by damage to the structure of blood vessels.  Functional PVD. This is caused by conditions that make blood vessels contract and tighten (spasm).  Without treatment, PVD tends to get worse over time. PVD can also lead to acute ischemic limb. This is when an arm or limb suddenly has trouble getting enough blood. This is a medical emergency. Follow these instructions at home:  Take medicines only as told by your doctor.  Do not use any tobacco products, including cigarettes, chewing tobacco, or electronic cigarettes. If you need help quitting, ask your doctor.  Lose weight if you are overweight, and maintain a healthy weight as told by your doctor.  Eat a diet that is low in fat and cholesterol. If you need help, ask your doctor.  Exercise regularly. Ask your doctor for some good activities for you.  Take good care of your feet. ? Wear comfortable shoes that fit well. ? Check your feet often for any cuts or sores. Contact a doctor if:  You have cramps in your legs while walking.  You have leg pain when you are at rest.  You have coldness in a leg or foot.  Your skin changes.  You are unable to get or have an erection (erectile dysfunction).  You have cuts or sores on your feet that are not healing. Get help right away if:  Your arm or leg turns cold and blue.  Your arms or legs become red, warm, swollen, painful, or numb.  You have chest pain or trouble breathing.  You suddenly have weakness in your face, arm, or leg.  You become very confused or you cannot speak.  You suddenly have a very bad headache.  You suddenly cannot see. This information is not intended to replace advice given to you by your health care provider. Make sure you discuss any questions you have with your health care provider. Document Released:  05/12/2009 Document Revised: 07/24/2015 Document Reviewed: 07/26/2013 Elsevier Interactive Patient Education  2017 Elsevier Inc.  

## 2017-09-28 HISTORY — PX: ESOPHAGOGASTRODUODENOSCOPY: SHX1529

## 2017-10-12 ENCOUNTER — Ambulatory Visit (INDEPENDENT_AMBULATORY_CARE_PROVIDER_SITE_OTHER): Payer: Medicare Other | Admitting: Internal Medicine

## 2017-11-08 DIAGNOSIS — M549 Dorsalgia, unspecified: Secondary | ICD-10-CM | POA: Diagnosis not present

## 2017-11-08 DIAGNOSIS — Z809 Family history of malignant neoplasm, unspecified: Secondary | ICD-10-CM | POA: Diagnosis not present

## 2017-11-08 DIAGNOSIS — G8929 Other chronic pain: Secondary | ICD-10-CM | POA: Diagnosis not present

## 2017-11-08 DIAGNOSIS — R590 Localized enlarged lymph nodes: Secondary | ICD-10-CM | POA: Diagnosis not present

## 2017-11-08 DIAGNOSIS — M778 Other enthesopathies, not elsewhere classified: Secondary | ICD-10-CM | POA: Diagnosis not present

## 2017-11-08 DIAGNOSIS — J9809 Other diseases of bronchus, not elsewhere classified: Secondary | ICD-10-CM | POA: Diagnosis not present

## 2017-11-08 DIAGNOSIS — E039 Hypothyroidism, unspecified: Secondary | ICD-10-CM | POA: Diagnosis not present

## 2017-11-08 DIAGNOSIS — K219 Gastro-esophageal reflux disease without esophagitis: Secondary | ICD-10-CM | POA: Diagnosis not present

## 2017-11-08 DIAGNOSIS — Z886 Allergy status to analgesic agent status: Secondary | ICD-10-CM | POA: Diagnosis not present

## 2017-11-08 DIAGNOSIS — R42 Dizziness and giddiness: Secondary | ICD-10-CM | POA: Diagnosis not present

## 2017-11-08 DIAGNOSIS — I700 Atherosclerosis of aorta: Secondary | ICD-10-CM | POA: Diagnosis not present

## 2017-11-08 DIAGNOSIS — Z8249 Family history of ischemic heart disease and other diseases of the circulatory system: Secondary | ICD-10-CM | POA: Diagnosis not present

## 2017-11-08 DIAGNOSIS — R599 Enlarged lymph nodes, unspecified: Secondary | ICD-10-CM | POA: Diagnosis not present

## 2017-11-08 DIAGNOSIS — Z79899 Other long term (current) drug therapy: Secondary | ICD-10-CM | POA: Diagnosis not present

## 2017-11-08 DIAGNOSIS — E785 Hyperlipidemia, unspecified: Secondary | ICD-10-CM | POA: Diagnosis not present

## 2017-11-08 DIAGNOSIS — K3184 Gastroparesis: Secondary | ICD-10-CM | POA: Diagnosis not present

## 2017-11-08 DIAGNOSIS — Z8261 Family history of arthritis: Secondary | ICD-10-CM | POA: Diagnosis not present

## 2017-11-08 DIAGNOSIS — R1084 Generalized abdominal pain: Secondary | ICD-10-CM | POA: Diagnosis not present

## 2017-11-08 DIAGNOSIS — R0789 Other chest pain: Secondary | ICD-10-CM | POA: Diagnosis not present

## 2017-11-08 DIAGNOSIS — Z833 Family history of diabetes mellitus: Secondary | ICD-10-CM | POA: Diagnosis not present

## 2017-11-08 DIAGNOSIS — R0602 Shortness of breath: Secondary | ICD-10-CM | POA: Diagnosis not present

## 2017-11-08 DIAGNOSIS — R079 Chest pain, unspecified: Secondary | ICD-10-CM | POA: Diagnosis not present

## 2017-11-08 DIAGNOSIS — I739 Peripheral vascular disease, unspecified: Secondary | ICD-10-CM | POA: Diagnosis not present

## 2017-11-08 DIAGNOSIS — J449 Chronic obstructive pulmonary disease, unspecified: Secondary | ICD-10-CM | POA: Diagnosis not present

## 2017-11-08 DIAGNOSIS — G629 Polyneuropathy, unspecified: Secondary | ICD-10-CM | POA: Diagnosis not present

## 2017-11-08 DIAGNOSIS — R531 Weakness: Secondary | ICD-10-CM | POA: Diagnosis not present

## 2017-11-09 DIAGNOSIS — E039 Hypothyroidism, unspecified: Secondary | ICD-10-CM | POA: Diagnosis not present

## 2017-11-09 DIAGNOSIS — R0789 Other chest pain: Secondary | ICD-10-CM | POA: Diagnosis not present

## 2017-11-09 DIAGNOSIS — E785 Hyperlipidemia, unspecified: Secondary | ICD-10-CM | POA: Diagnosis not present

## 2017-11-17 DIAGNOSIS — Z6821 Body mass index (BMI) 21.0-21.9, adult: Secondary | ICD-10-CM | POA: Diagnosis not present

## 2017-11-17 DIAGNOSIS — R1013 Epigastric pain: Secondary | ICD-10-CM | POA: Diagnosis not present

## 2017-11-17 DIAGNOSIS — R112 Nausea with vomiting, unspecified: Secondary | ICD-10-CM | POA: Diagnosis not present

## 2017-11-21 ENCOUNTER — Other Ambulatory Visit (HOSPITAL_COMMUNITY): Payer: Self-pay | Admitting: Family Medicine

## 2017-11-21 DIAGNOSIS — J9809 Other diseases of bronchus, not elsewhere classified: Secondary | ICD-10-CM

## 2017-11-24 DIAGNOSIS — J449 Chronic obstructive pulmonary disease, unspecified: Secondary | ICD-10-CM | POA: Diagnosis not present

## 2017-11-24 DIAGNOSIS — R109 Unspecified abdominal pain: Secondary | ICD-10-CM | POA: Diagnosis not present

## 2017-11-28 ENCOUNTER — Encounter (HOSPITAL_COMMUNITY)
Admission: RE | Admit: 2017-11-28 | Discharge: 2017-11-28 | Disposition: A | Discharge status: Home / Self Care | Payer: Medicare Other | Source: Ambulatory Visit | Attending: Family Medicine | Admitting: Family Medicine

## 2017-11-28 DIAGNOSIS — J9809 Other diseases of bronchus, not elsewhere classified: Secondary | ICD-10-CM | POA: Insufficient documentation

## 2017-11-28 MED ORDER — FLUDEOXYGLUCOSE F - 18 (FDG) INJECTION
10.6500 | Freq: Once | INTRAVENOUS | Status: AC | PRN
  Administered 2017-11-28: 10.65 via INTRAVENOUS

## 2017-11-28 NOTE — Patient Instructions (Signed)
Your procedure is scheduled on: 11/30/2017  Report to [facility] at   8:00  AM.  Call this number if you have problems the morning of surgery: [phone]   Remember:   Do not drink or eat food:After Midnight.  :  Take these medicines the morning of surgery with A SIP OF WATER: Levothyroxine and Zoloft   Do not wear jewelry, make-up or nail polish.  Do not wear lotions, powders, or perfumes. You may wear deodorant.   Do not bring valuables to the hospital.  Contacts, dentures or bridgework may not be worn into surgery.  Leave suitcase in the car. After surgery it may be brought to your room.  For patients admitted to the hospital, checkout time is 11:00 AM the day of discharge.   Patients discharged the day of surgery will not be allowed to drive home.   Flexible Bronchoscopy Bronchoscopy is a procedure used to examine the passageways in the lungs. During the procedure a thin, flexible tool with a lens and camera or eyepiece is passed in your mouth or nose, down the windpipe (trachea), and into the air tubes (bronchi). This tool allows your health care provider to carefully look at your lungs from the inside and take diagnostic samples if needed. Tell a health care provider about:  Allergies to food or medicine.  All medicines you are taking, including blood thinners, vitamins, herbs, eye drops, creams, and over-the-counter medicines.  Any problems you or family members have had with anesthetic medicines.  Any blood disorders you have.  Any surgeries you have had.  Medical conditions you have, including heart disease, diabetes, or kidney problems.  Possibility of pregnancy, if this applies. What are the risks? Generally, this is a safe procedure. However, as with any procedure, problems can occur. Possible problems include:  Collapsed lung (pneumothorax).  Bleeding.  Increased need for oxygen or difficulty breathing after the procedure.  What happens before the  procedure? Do not eat or drink anything after midnight on the night before the procedure or as directed by your health care provider. What happens during the procedure?  Relax as much as possible during the procedure.  Medicines may be given to relax you, dry up your secretions, and control coughing.  A numbing medicine (local anesthetic) will be given to numb your mouth, nose, throat, and voice box (larynx). You will be able to breathe normally during the procedure.  Samples of airway secretions may be collected for testing.  If abnormal areas are seen in your airways, tissue samples may be taken for examination under a microscope (biopsy).  If tissue samples are needed from the outer portions of the lung, a type of X-ray called fluoroscopy may be done.  If bleeding occurs, a drug may be used to stop or decrease the bleeding. What happens after the procedure?  You may receive a chest X-ray following the procedure. This is to make sure the lungs have not collapsed (pneumothorax). This information is not intended to replace advice given to you by your health care provider. Make sure you discuss any questions you have with your health care provider. Document Released: 02/13/2000 Document Revised: 07/24/2015 Document Reviewed: 10/20/2012 Elsevier Interactive Patient Education  2017 [place].  Flexible Bronchoscopy, Care After This sheet gives you information about how to care for yourself after your procedure. Your health care provider may also give you more specific instructions. If you have problems or questions, contact your health care provider. What can I expect after  the procedure? After the procedure, it is common to have the following symptoms for 24-48 hours:  A cough that is worse than it was before the procedure.  A low-grade fever.  A sore throat or hoarse voice.  Small streaks of blood in the mucus from your lungs (sputum), if tissue samples were removed  (biopsy).  Follow these instructions at home: Eating and drinking  Do not eat or drink anything (including water) for 2 hours after your procedure, or until your numbing medicine (local anesthetic) has worn off. Having a numb throat increases your risk of burning yourself or choking.  After your numbness is gone and your cough and gag reflexes have returned, you may start eating only soft foods and slowly drinking liquids.  The day after the procedure, return to your normal diet. Driving  Do not drive for 24 hours if you were given a medicine to help you relax (sedative).  Do not drive or use heavy machinery while taking prescription pain medicine. General instructions  Take over-the-counter and prescription medicines only as told by your health care provider.  Return to your normal activities as told by your health care provider. Ask your health care provider what activities are safe for you.  Do not use any products that contain nicotine or tobacco, such as cigarettes and e-cigarettes. If you need help quitting, ask your health care provider.  Keep all follow-up visits as told by your health care provider. This is important, especially if you had a biopsy taken. Get help right away if:  You have shortness of breath that gets worse.  You become light-headed or feel like you might faint.  You have chest pain.  You cough up more than a small amount of blood.  The amount of blood you cough up increases. Summary  Common symptoms in the 24-48 hours following a flexible bronchoscopy include cough, low-grade fever, sore throat or hoarse voice, and blood-streaked mucus from the lungs (if you had a biopsy).  Do not eat or drink anything (including water) for 2 hours after your procedure, or until your local anesthetic has worn off. You can return to your normal diet the day after the procedure.  Get help right away if you develop worsening shortness of breath, have chest pain, become  light-headed, or cough up more than a small amount of blood. This information is not intended to replace advice given to you by your health care provider. Make sure you discuss any questions you have with your health care provider. Document Released: 09/04/2004 Document Revised: 03/05/2016 Document Reviewed: 03/05/2016 Elsevier Interactive Patient Education  2017 [place], Care After These instructions provide you with information about caring for yourself after your procedure. Your health care provider may also give you more specific instructions. Your treatment has been planned according to current medical practices, but problems sometimes occur. Call your health care provider if you have any problems or questions after your procedure. What can I expect after the procedure? After your procedure, it is common to:  Feel sleepy for several hours.  Feel clumsy and have poor balance for several hours.  Feel forgetful about what happened after the procedure.  Have poor judgment for several hours.  Feel nauseous or vomit.  Have a sore throat if you had a breathing tube during the procedure.  Follow these instructions at home: For at least 24 hours after the procedure:   Do not: ? Participate in activities in which  you could fall or become injured. ? Drive. ? Use heavy machinery. ? Drink alcohol. ? Take sleeping pills or medicines that cause drowsiness. ? Make important decisions or sign legal documents. ? Take care of children on your own.  Rest. Eating and drinking  Follow the diet that is recommended by your health care provider.  If you vomit, drink water, juice, or soup when you can drink without vomiting.  Make sure you have little or no nausea before eating solid foods. General instructions  Have a responsible adult stay with you until you are awake and alert.  Take over-the-counter and prescription medicines only as told by your  health care provider.  If you smoke, do not smoke without supervision.  Keep all follow-up visits as told by your health care provider. This is important. Contact a health care provider if:  You keep feeling nauseous or you keep vomiting.  You feel light-headed.  You develop a rash.  You have a fever. Get help right away if:  You have trouble breathing. This information is not intended to replace advice given to you by your health care provider. Make sure you discuss any questions you have with your health care provider. Document Released: 06/08/2015 Document Revised: 10/08/2015 Document Reviewed: 06/08/2015 Elsevier Interactive Patient Education  [employer].

## 2017-11-29 ENCOUNTER — Encounter (HOSPITAL_COMMUNITY)
Admission: RE | Admit: 2017-11-29 | Discharge: 2017-11-29 | Disposition: A | Discharge status: Home / Self Care | Payer: Medicare Other | Source: Ambulatory Visit | Attending: Pulmonary Disease | Admitting: Pulmonary Disease

## 2017-11-29 ENCOUNTER — Other Ambulatory Visit: Payer: Self-pay

## 2017-11-29 ENCOUNTER — Encounter (HOSPITAL_COMMUNITY): Payer: Self-pay

## 2017-11-29 DIAGNOSIS — R911 Solitary pulmonary nodule: Secondary | ICD-10-CM

## 2017-11-29 DIAGNOSIS — R918 Other nonspecific abnormal finding of lung field: Secondary | ICD-10-CM | POA: Diagnosis not present

## 2017-11-29 DIAGNOSIS — F17290 Nicotine dependence, other tobacco product, uncomplicated: Secondary | ICD-10-CM | POA: Diagnosis not present

## 2017-11-29 DIAGNOSIS — Z01818 Encounter for other preprocedural examination: Secondary | ICD-10-CM

## 2017-11-29 DIAGNOSIS — J449 Chronic obstructive pulmonary disease, unspecified: Secondary | ICD-10-CM | POA: Diagnosis not present

## 2017-11-29 DIAGNOSIS — G8929 Other chronic pain: Secondary | ICD-10-CM | POA: Diagnosis not present

## 2017-11-29 DIAGNOSIS — Z7982 Long term (current) use of aspirin: Secondary | ICD-10-CM | POA: Diagnosis not present

## 2017-11-29 DIAGNOSIS — I10 Essential (primary) hypertension: Secondary | ICD-10-CM | POA: Diagnosis not present

## 2017-11-29 DIAGNOSIS — E039 Hypothyroidism, unspecified: Secondary | ICD-10-CM | POA: Diagnosis not present

## 2017-11-29 DIAGNOSIS — F329 Major depressive disorder, single episode, unspecified: Secondary | ICD-10-CM | POA: Diagnosis not present

## 2017-11-29 DIAGNOSIS — M549 Dorsalgia, unspecified: Secondary | ICD-10-CM | POA: Diagnosis not present

## 2017-11-29 DIAGNOSIS — Z885 Allergy status to narcotic agent status: Secondary | ICD-10-CM | POA: Diagnosis not present

## 2017-11-29 DIAGNOSIS — M1990 Unspecified osteoarthritis, unspecified site: Secondary | ICD-10-CM | POA: Diagnosis not present

## 2017-11-29 LAB — CBC
HCT: 44.8 % (ref 39.0–52.0)
Hemoglobin: 15.4 g/dL (ref 13.0–17.0)
MCH: 33.1 pg (ref 26.0–34.0)
MCHC: 34.4 g/dL (ref 30.0–36.0)
MCV: 96.3 fL (ref 78.0–100.0)
Platelets: 175 10*3/uL (ref 150–400)
RBC: 4.65 MIL/uL (ref 4.22–5.81)
RDW: 13.5 % (ref 11.5–15.5)
WBC: 9.7 10*3/uL (ref 4.0–10.5)

## 2017-11-29 LAB — BASIC METABOLIC PANEL
Anion gap: 10 (ref 5–15)
BUN: 19 mg/dL (ref 8–23)
CALCIUM: 9.4 mg/dL (ref 8.9–10.3)
CO2: 29 mmol/L (ref 22–32)
CREATININE: 1.07 mg/dL (ref 0.61–1.24)
Chloride: 102 mmol/L (ref 98–111)
GFR calc Af Amer: >60 mL/min (ref >60)
GFR calc non Af Amer: >60 mL/min (ref >60)
Glucose, Bld: 129 mg/dL — ABNORMAL HIGH (ref 70–99)
Potassium: 4.4 mmol/L (ref 3.5–5.1)
SODIUM: 141 mmol/L (ref 135–145)

## 2017-11-30 ENCOUNTER — Ambulatory Visit (HOSPITAL_COMMUNITY): Payer: Medicare Other | Admitting: Anesthesiology

## 2017-11-30 ENCOUNTER — Encounter (HOSPITAL_COMMUNITY)
Admission: RE | Disposition: A | Discharge status: Home / Self Care | Payer: Self-pay | Source: Ambulatory Visit | Attending: Pulmonary Disease

## 2017-11-30 ENCOUNTER — Ambulatory Visit (HOSPITAL_COMMUNITY)
Admission: RE | Admit: 2017-11-30 | Discharge: 2017-11-30 | Disposition: A | Discharge status: Home / Self Care | Payer: Medicare Other | Source: Ambulatory Visit | Attending: Pulmonary Disease | Admitting: Pulmonary Disease

## 2017-11-30 ENCOUNTER — Encounter (HOSPITAL_COMMUNITY): Payer: Self-pay | Admitting: *Deleted

## 2017-11-30 DIAGNOSIS — M1990 Unspecified osteoarthritis, unspecified site: Secondary | ICD-10-CM | POA: Diagnosis not present

## 2017-11-30 DIAGNOSIS — Z7982 Long term (current) use of aspirin: Secondary | ICD-10-CM | POA: Insufficient documentation

## 2017-11-30 DIAGNOSIS — J449 Chronic obstructive pulmonary disease, unspecified: Secondary | ICD-10-CM | POA: Diagnosis not present

## 2017-11-30 DIAGNOSIS — G8929 Other chronic pain: Secondary | ICD-10-CM | POA: Diagnosis not present

## 2017-11-30 DIAGNOSIS — Z885 Allergy status to narcotic agent status: Secondary | ICD-10-CM | POA: Insufficient documentation

## 2017-11-30 DIAGNOSIS — M549 Dorsalgia, unspecified: Secondary | ICD-10-CM | POA: Insufficient documentation

## 2017-11-30 DIAGNOSIS — E039 Hypothyroidism, unspecified: Secondary | ICD-10-CM | POA: Insufficient documentation

## 2017-11-30 DIAGNOSIS — F17290 Nicotine dependence, other tobacco product, uncomplicated: Secondary | ICD-10-CM | POA: Insufficient documentation

## 2017-11-30 DIAGNOSIS — R918 Other nonspecific abnormal finding of lung field: Secondary | ICD-10-CM | POA: Insufficient documentation

## 2017-11-30 DIAGNOSIS — I10 Essential (primary) hypertension: Secondary | ICD-10-CM | POA: Diagnosis not present

## 2017-11-30 DIAGNOSIS — R846 Abnormal cytological findings in specimens from respiratory organs and thorax: Secondary | ICD-10-CM | POA: Diagnosis not present

## 2017-11-30 DIAGNOSIS — R911 Solitary pulmonary nodule: Secondary | ICD-10-CM | POA: Diagnosis not present

## 2017-11-30 DIAGNOSIS — F329 Major depressive disorder, single episode, unspecified: Secondary | ICD-10-CM | POA: Insufficient documentation

## 2017-11-30 HISTORY — PX: FLEXIBLE BRONCHOSCOPY: SHX5094

## 2017-11-30 HISTORY — PX: BRONCHIAL BRUSHINGS: SHX5108

## 2017-11-30 HISTORY — PX: BRONCHIAL WASHINGS: SHX5105

## 2017-11-30 SURGERY — BRONCHOSCOPY, FLEXIBLE
Anesthesia: Monitor Anesthesia Care | Laterality: Right

## 2017-11-30 MED ORDER — LIDOCAINE HCL (CARDIAC) PF 50 MG/5ML IV SOSY
PREFILLED_SYRINGE | INTRAVENOUS | Status: DC | PRN
  Administered 2017-11-30: 40 mg via INTRAVENOUS

## 2017-11-30 MED ORDER — HYDROCODONE-ACETAMINOPHEN 7.5-325 MG PO TABS: 1.0000 | ORAL_TABLET | Freq: Once | ORAL | Status: DC | PRN

## 2017-11-30 MED ORDER — HYDROMORPHONE HCL 1 MG/ML IJ SOLN: 0.2500 mg | INTRAMUSCULAR | Status: DC | PRN

## 2017-11-30 MED ORDER — LACTATED RINGERS IV SOLN: INTRAVENOUS | Status: DC

## 2017-11-30 MED ORDER — LIDOCAINE VISCOUS HCL 2 % MT SOLN
OROMUCOSAL | Status: AC
  Filled 2017-11-30: qty 15

## 2017-11-30 MED ORDER — LACTATED RINGERS IV SOLN
INTRAVENOUS | Status: DC
  Administered 2017-11-30: 09:00:00 via INTRAVENOUS

## 2017-11-30 MED ORDER — GLYCOPYRROLATE 0.2 MG/ML IJ SOLN
INTRAMUSCULAR | Status: DC | PRN
  Administered 2017-11-30: 0.2 mg via INTRAVENOUS

## 2017-11-30 MED ORDER — PROPOFOL 500 MG/50ML IV EMUL
INTRAVENOUS | Status: DC | PRN
  Administered 2017-11-30: 150 ug/kg/min via INTRAVENOUS

## 2017-11-30 MED ORDER — LIDOCAINE HCL (PF) 2 % IJ SOLN
INTRAMUSCULAR | Status: AC
  Filled 2017-11-30: qty 20

## 2017-11-30 MED ORDER — MEPERIDINE HCL 100 MG/ML IJ SOLN: 6.2500 mg | INTRAMUSCULAR | Status: DC | PRN

## 2017-11-30 MED ORDER — LIDOCAINE HCL (PF) 2 % IJ SOLN
INTRAMUSCULAR | Status: DC | PRN
  Administered 2017-11-30: 18 mL via INTRADERMAL

## 2017-11-30 MED ORDER — KETAMINE HCL 10 MG/ML IJ SOLN
INTRAMUSCULAR | Status: DC | PRN
  Administered 2017-11-30: 10 mg via INTRAVENOUS

## 2017-11-30 MED ORDER — LIDOCAINE VISCOUS HCL 2 % MT SOLN
OROMUCOSAL | Status: DC | PRN
  Administered 2017-11-30: 5 mL via OROMUCOSAL

## 2017-11-30 MED ORDER — PROMETHAZINE HCL 25 MG/ML IJ SOLN: 6.2500 mg | INTRAMUSCULAR | Status: DC | PRN

## 2017-11-30 SURGICAL SUPPLY — 16 items
BRUSH CYTOL CELLEBRITY 1.5X140 (MISCELLANEOUS) ×4 IMPLANT
CLOTH BEACON ORANGE TIMEOUT ST (SAFETY) ×4 IMPLANT
CONNECTOR 5 IN 1 STRAIGHT STRL (MISCELLANEOUS) ×4 IMPLANT
FORCEPS BIOP RJ4 1.8 (CUTTING FORCEPS) ×4 IMPLANT
GLOVE BIO SURGEON STRL SZ7.5 (GLOVE) ×4 IMPLANT
KIT CLEAN CATCH URINE (SET/KITS/TRAYS/PACK) IMPLANT
MARKER SKIN DUAL TIP RULER LAB (MISCELLANEOUS) ×4 IMPLANT
NS IRRIG 1000ML POUR BTL (IV SOLUTION) ×4 IMPLANT
SPONGE GAUZE 4X4 12PLY (GAUZE/BANDAGES/DRESSINGS) ×4 IMPLANT
SYR 20CC LL (SYRINGE) ×4 IMPLANT
SYR 30ML LL (SYRINGE) ×4 IMPLANT
SYR CONTROL 10ML LL (SYRINGE) ×4 IMPLANT
TRAP SPECIMEN CP (MISCELLANEOUS) ×4 IMPLANT
VALVE DISPOSABLE (MISCELLANEOUS) ×4 IMPLANT
WATER STERILE IRR 1000ML POUR (IV SOLUTION) ×4 IMPLANT
YANKAUER SUCT BULB TIP 10FT TU (MISCELLANEOUS) ×8 IMPLANT

## 2017-11-30 NOTE — H&P (Signed)
Terry Boyd MRN: [id number] DOB/AGE: 03-25-1940 77 y.o. Primary Care Physician:Howard, Lennette Bihari, MD Admit date: 11/30/2017 Chief Complaint: Abnormal chest CT HPI: This is a 77 year old who was sent to me because of shortness of breath cough congestion and weight loss.  He underwent a chest CT and was found to have narrowing in the right lower lobe bronchus which is concerning for endobronchial lesion.  He has an extensive smoking history and has continued to smoke.  He is having trouble with nausea abdominal pain and not being able to keep food down.  He is lost about 40 pounds.  No other complaints at this time.  Risks and benefits of the procedure were thoroughly explained to the patient and his daughter in my office  Past Medical History:  Diagnosis Date  . AAA (abdominal aortic aneurysm) ([place])   . Arthritis   . Chronic back pain   . COPD (chronic obstructive pulmonary disease) ([place])   . Depression   . Hypertension   . Hypothyroidism   . Smoker   . Thyroid disease    Past Surgical History:  Procedure Laterality Date  . ABDOMINAL AORTAGRAM N/A 03/14/2012   Procedure: ABDOMINAL Maxcine Ham;  Surgeon: Serafina Mitchell, MD;  Location: [facility] CATH LAB;  Service: Cardiovascular;  Laterality: N/A;  . ABDOMINAL AORTIC ANEURYSM REPAIR  04-22-2009   Stent graft repair of AAA  . APPENDECTOMY    . BYPASS GRAFT POPLITEAL TO POPLITEAL  03/20/2012   Procedure: BYPASS GRAFT POPLITEAL TO POPLITEAL;  Surgeon: Elam Dutch, MD;  Location: [place];  Service: Vascular;  Laterality: Left;  ABOVE THE KNEE POPLITEAL ARTERY TO BELOW THE KNEE POPLITEAL ARTERY BYPASS GRAFT USING REVERSE LEFT GREATER SAPHENOUS VEIN   . ENDARTERECTOMY POPLITEAL  03/20/2012   Procedure: ENDARTERECTOMY POPLITEAL;  Surgeon: Elam Dutch, MD;  Location: [facility] OR;  Service: Vascular;  Laterality: Left;   LIGATION OF LEFT LEG POPLITEAL ANEURYSM  . INTRAOPERATIVE ARTERIOGRAM  03/20/2012   Procedure: INTRA OPERATIVE ARTERIOGRAM;  Surgeon:  Elam Dutch, MD;  Location: [facility] OR;  Service: Vascular;  Laterality: Left;  TIMES ONE  . VASECTOMY  1982        Family History  Problem Relation Age of Onset  . Diabetes Mother   . Heart disease Father   . Diabetes Father   . Heart disease Sister        CABG x 4  . Cancer Brother        throat cancer    Social History:  reports that he has been smoking cigars. He has smoked for the past 57.00 years. He has never used smokeless tobacco. He reports that he does not drink alcohol or use drugs.   Allergies:  Allergies  Allergen Reactions  . Morphine And Related Shortness Of Breath and Other (See Comments)    hallucinations    Medications Prior to Admission  Medication Sig Dispense Refill  . Ipratropium-Albuterol (COMBIVENT RESPIMAT) 20-100 MCG/ACT AERS respimat Inhale 1 puff into the lungs every 6 (six) hours as needed for wheezing.    Marland Kitchen levothyroxine (SYNTHROID, LEVOTHROID) 137 MCG tablet Take 137 mcg by mouth daily before breakfast.     . LORazepam (ATIVAN) 1 MG tablet Take 1 mg by mouth at bedtime.     . metoCLOPramide (REGLAN) 5 MG tablet Take 5 mg by mouth daily before lunch.    Marland Kitchen POTASSIUM PO Take 1 tablet by mouth daily.    . sertraline (ZOLOFT) 50 MG tablet Take 50 mg by  mouth every morning.     . simvastatin (ZOCOR) 10 MG tablet Take 10 mg by mouth at bedtime.    Marland Kitchen aspirin EC 325 MG tablet Take 1 tablet (325 mg total) by mouth daily. (Patient not taking: Reported on 11/28/2017) 100 tablet 3  . oxyCODONE (OXY IR/ROXICODONE) 5 MG immediate release tablet Take 1-2 tablets (5-10 mg total) by mouth every 4 (four) hours as needed. (Patient not taking: Reported on 11/28/2017) 30 tablet 0       KWI:OXBDZ from the symptoms mentioned above,there are no other symptoms referable to all systems reviewed.  Physical Exam: Blood pressure 103/60, pulse (!) 59, temperature 97.7 F (36.5 C), temperature source Oral, resp. rate (!) 21, SpO2 92 %. Constitutional: He is awake and  alert.  He is thin.  Eyes: Pupils react EOMI.  Ears nose and throat his mucous membranes are moist.  His neck is supple without masses.  Cardiovascular: His heart is regular with normal heart sounds.  Respiratory: Respiratory effort is normal and his lungs show rhonchi bilaterally.  Gastrointestinal: He has no abdominal masses.  Musculoskeletal: Grossly normal strength bilaterally.  Neurological: No focal abnormalities.  Psychiatric: Normal mood and affect   Recent Labs    11/29/17 0918  WBC 9.7  HGB 15.4  HCT 44.8  MCV 96.3  PLT 175   Recent Labs    11/29/17 0918  NA 141  K 4.4  CL 102  CO2 29  GLUCOSE 129*  BUN 19  CREATININE 1.07  CALCIUM 9.4  lablast2(ast:2,ALT:2,alkphos:2,bilitot:2,prot:2,albumin:2)@    No results found for this or any previous visit (from the past 240 hour(s)).   Nm Pet Image Initial (pi) Skull Base To Thigh  Result Date: 11/29/2017 CLINICAL DATA:  Initial treatment strategy for right lower lobe pulmonary nodule. EXAM: NUCLEAR MEDICINE PET SKULL BASE TO THIGH TECHNIQUE: 10.7 mCi F-18 FDG was injected intravenously. Full-ring PET imaging was performed from the skull base to thigh after the radiotracer. CT data was obtained and used for attenuation correction and anatomic localization. Fasting blood glucose: 108 mg/dl COMPARISON:  11/08/2017 CT FINDINGS: Mediastinal blood pool activity: SUV max 2.1 NECK: Low-level activity in the vicinity of the patient's mandibular dental orthotic is thought to be incidental/physiologic. Likewise, low-grade activity in the upper cervical esophagus with maximum SUV 3.4 is thought to likely be physiologic. Incidental CT findings: Left common carotid atherosclerotic calcification. CHEST: There is hypermetabolic right paratracheal, subcarinal, and right hilar hilar adenopathy associated with the suspected endobronchial lesion of the proximal right inferior lobar bronchus. An upper right paratracheal node measuring 1.2 cm in short axis  on image 73/3 has maximum SUV of 8.0. A subcarinal node measuring 1.4 cm in short axis on image 91/3 has a maximum SUV of 6.9. A right hilar node has a maximum SUV of 7.8 and measures 1.3 cm in short axis on image 102/3. The lesion in the vicinity of the proximal right inferior lobar bronchus has a maximum SUV of 5.5. There has faintly accentuated metabolic activity in a right upper hilar lymph node and in a precarinal lymph node as well. Moreover, there are several small anterior mediastinal lymph nodes which are faintly metabolic and just behind the sternum. One of the larger of these prevascular lymph nodes measures 0.9 cm in short axis on image 71/3 with maximum SUV of 2.3. There is accentuated activity in the distal esophagus without definite CT correlate. Maximum SUV 4.9. Incidental CT findings: Centrilobular emphysema. Airway thickening and plugging in the right lower  lobe. Coronary, aortic arch, and branch vessel atherosclerotic vascular disease. ABDOMEN/PELVIS: No significant abnormal hypermetabolic activity in this region. Incidental CT findings: A 2.0 by 1.7 cm mass of the right adrenal gland measures -12 Hounsfield units, compatible with adrenal adenoma, and is not significantly hypermetabolic. Punctate calcifications in the splenic parenchyma compatible with old granulomatous disease. Aortoiliac atherosclerotic vascular disease. Aorta bi-iliac graft noted. Vascular calcifications in the renal hila. Stomach mildly distended by meal. Mild sigmoid colon diverticulosis. Scattered physiologic activity in bowel. SKELETON: No significant abnormal hypermetabolic activity in this region. Incidental CT findings: Thoracic kyphosis. Grade 1 degenerative anterolisthesis at L5-S1. IMPRESSION: 1. The lesion of concern in the vicinity of the proximal right inferior lobar bronchus is hypermetabolic with maximum SUV 5.5. There is also hypermetabolic right hilar and ipsilateral mediastinal adenopathy including right  paratracheal, subcarinal, and right hilar lymph nodes with maximum SUV up to 8.0. Finally, there several small prevascular lymph nodes which demonstrate only low-grade metabolic activity better unusually prominent for this location, including a 0.9 cm in short axis prevascular node on image 71/3 with maximum SUV 2.3. Appearance compatible with malignancy. 2. No findings of metastatic disease/malignancy involving the abdomen/pelvis, neck, or skeleton. 3. Other imaging findings of potential clinical significance: Aortic Atherosclerosis (ICD10-I70.0) and Emphysema (ICD10-J43.9). Coronary atherosclerosis. Airway thickening and plugging in the right lower lobe, potentially postobstructive. Small right adrenal adenoma. Mild sigmoid colon diverticulosis. Thoracic kyphosis. Electronically Signed   By: Van Clines M.D.   On: 11/29/2017 09:05   Impression: He has abnormal chest CT.  He is going to undergo bronchoscopy to rule out endobronchial lesion since he has potential cancer. Active Problems:   * No active hospital problems. *     Plan: For bronchoscopy today      Jaymen Fetch L   11/30/2017, 8:58 AM

## 2017-11-30 NOTE — Anesthesia Postprocedure Evaluation (Signed)
Anesthesia Post Note  Patient: Terry Boyd  Procedure(s) Performed: FLEXIBLE BRONCHOSCOPY (N/A ) BRONCHIAL WASHINGS (Right ) BRONCHIAL BRUSHINGS (Right )  Patient location during evaluation: PACU Anesthesia Type: MAC Level of consciousness: awake and alert and oriented Pain management: pain level controlled Vital Signs Assessment: post-procedure vital signs reviewed and stable Respiratory status: spontaneous breathing Cardiovascular status: stable Postop Assessment: no apparent nausea or vomiting Anesthetic complications: no     Last Vitals:  Vitals:   11/30/17 0828 11/30/17 1047  BP: 103/60   Pulse: (!) 59   Resp: (!) 21   Temp: 36.5 C (P) 36.5 C  SpO2: 92%     Last Pain:  Vitals:   11/30/17 0828  TempSrc: Oral  PainSc: 0-No pain                 Kaliya Shreiner A

## 2017-11-30 NOTE — Discharge Instructions (Signed)
Monitored Anesthesia Care, Care After °These instructions provide you with information about caring for yourself after your procedure. Your health care provider may also give you more specific instructions. Your treatment has been planned according to current medical practices, but problems sometimes occur. Call your health care provider if you have any problems or questions after your procedure. °What can I expect after the procedure? °After your procedure, it is common to: °· Feel sleepy for several hours. °· Feel clumsy and have poor balance for several hours. °· Feel forgetful about what happened after the procedure. °· Have poor judgment for several hours. °· Feel nauseous or vomit. °· Have a sore throat if you had a breathing tube during the procedure. ° °Follow these instructions at home: °For at least 24 hours after the procedure: ° °· Do not: °? Participate in activities in which you could fall or become injured. °? Drive. °? Use heavy machinery. °? Drink alcohol. °? Take sleeping pills or medicines that cause drowsiness. °? Make important decisions or sign legal documents. °? Take care of children on your own. °· Rest. °Eating and drinking °· Follow the diet that is recommended by your health care provider. °· If you vomit, drink water, juice, or soup when you can drink without vomiting. °· Make sure you have little or no nausea before eating solid foods. °General instructions °· Have a responsible adult stay with you until you are awake and alert. °· Take over-the-counter and prescription medicines only as told by your health care provider. °· If you smoke, do not smoke without supervision. °· Keep all follow-up visits as told by your health care provider. This is important. °Contact a health care provider if: °· You keep feeling nauseous or you keep vomiting. °· You feel light-headed. °· You develop a rash. °· You have a fever. °Get help right away if: °· You have trouble breathing. °This information is  not intended to replace advice given to you by your health care provider. Make sure you discuss any questions you have with your health care provider. °Document Released: 06/08/2015 Document Revised: 10/08/2015 Document Reviewed: 06/08/2015 °Elsevier Interactive Patient Education © 2018 [employer] °Flexible Bronchoscopy, Care After °These instructions give you information on caring for yourself after your procedure. Your doctor may also give you more specific instructions. Call your doctor if you have any problems or questions after your procedure. °Follow these instructions at home: °· Do not eat or drink anything for 2 hours after your procedure. If you try to eat or drink before the medicine wears off, food or drink could go into your lungs. You could also burn yourself. °· After 2 hours have passed and when you can cough and gag normally, you may eat soft food and drink liquids slowly. °· The day after the test, you may eat your normal diet. °· You may do your normal activities. °· Keep all doctor visits. °Get help right away if: °· You get more and more short of breath. °· You get light-headed. °· You feel like you are going to pass out (faint). °· You have chest pain. °· You have new problems that worry you. °· You cough up more than a little blood. °· You cough up more blood than before. °This information is not intended to replace advice given to you by your health care provider. Make sure you discuss any questions you have with your health care provider. °Document Released: 12/13/2008 Document Revised: 07/24/2015 Document Reviewed: 10/20/2012 °Elsevier Interactive Patient   Patient Education  2017 [employer].

## 2017-11-30 NOTE — Transfer of Care (Signed)
Immediate Anesthesia Transfer of Care Note  Patient: ISIDORO SANTILLANA  Procedure(s) Performed: FLEXIBLE BRONCHOSCOPY (N/A ) BRONCHIAL WASHINGS (Right ) BRONCHIAL BRUSHINGS (Right )  Patient Location: PACU  Anesthesia Type:MAC  Level of Consciousness: awake, alert , oriented and patient cooperative  Airway & Oxygen Therapy: Patient Spontanous Breathing  Post-op Assessment: Report given to RN and Post -op Vital signs reviewed and stable  Post vital signs: Reviewed and stable  Last Vitals:  Vitals Value Taken Time  BP 101/71 11/30/2017 10:47 AM  Temp    Pulse 71 11/30/2017 10:51 AM  Resp 17 11/30/2017 10:51 AM  SpO2 95 % 11/30/2017 10:51 AM  Vitals shown include unvalidated device data.  Last Pain:  Vitals:   11/30/17 0828  TempSrc: Oral  PainSc: 0-No pain         Complications: No apparent anesthesia complications

## 2017-11-30 NOTE — Anesthesia Procedure Notes (Signed)
Procedure Name: [place] Performed by: Andree Elk Amy A, CRNA Pre-anesthesia Checklist: Patient identified, Emergency Drugs available, Suction available, Patient being monitored and Timeout performed Oxygen Delivery Method: Simple face mask

## 2017-11-30 NOTE — Anesthesia Preprocedure Evaluation (Signed)
Anesthesia Evaluation  Patient identified by MRN, date of birth, ID band Patient awake    Reviewed: Allergy & Precautions, H&P , NPO status , Patient's Chart, lab work & pertinent test results, reviewed documented beta blocker date and time   Airway Mallampati: II  TM Distance: >3 FB Neck ROM: full    Dental no notable dental hx.    Pulmonary neg pulmonary ROS, shortness of breath, COPD,  COPD inhaler, Current Smoker,    Pulmonary exam normal breath sounds clear to auscultation       Cardiovascular Exercise Tolerance: Good hypertension, On Medications + Peripheral Vascular Disease  negative cardio ROS   Rhythm:regular Rate:Normal     Neuro/Psych PSYCHIATRIC DISORDERS Depression negative neurological ROS  negative psych ROS   GI/Hepatic negative GI ROS, Neg liver ROS,   Endo/Other  negative endocrine ROSHypothyroidism   Renal/GU negative Renal ROS  negative genitourinary   Musculoskeletal   Abdominal   Peds  Hematology negative hematology ROS (+)   Anesthesia Other Findings NSR 65 COPD  Reproductive/Obstetrics negative OB ROS                             Anesthesia Physical Anesthesia Plan  ASA: III  Anesthesia Plan: MAC   Post-op Pain Management:    Induction:   PONV Risk Score and Plan:   Airway Management Planned:   Additional Equipment:   Intra-op Plan:   Post-operative Plan:   Informed Consent: I have reviewed the patients History and Physical, chart, labs and discussed the procedure including the risks, benefits and alternatives for the proposed anesthesia with the patient or authorized representative who has indicated his/her understanding and acceptance.   Dental Advisory Given  Plan Discussed with: CRNA and Anesthesiologist  Anesthesia Plan Comments:         Anesthesia Quick Evaluation

## 2017-11-30 NOTE — Op Note (Signed)
Bronchoscopy Procedure Note Terry Boyd [id number] 01/02/1941  Procedure: Bronchoscopy Indications: Diagnostic evaluation of the airways and Obtain specimens for culture and/or other diagnostic studies  Procedure Details Consent: Risks of procedure as well as the alternatives and risks of each were explained to the (patient/caregiver).  Consent for procedure obtained. Time Out: Verified patient identification, verified procedure, site/side was marked, verified correct patient position, special equipment/implants available, medications/allergies/relevent history reviewed, required imaging and test results available.  Performed  In preparation for procedure, patient was given 100% FiO2 and bronchoscope lubricated. Sedation: Propofol per anesthesia  Airway entered and the following bronchi were examined: RUL, RML, RLL, LUL, LLL and Bronchi.   Procedures performed: Brushings performed Bronchoscope removed.    Evaluation Hemodynamic Status: BP stable throughout; O2 sats: stable throughout Patient's Current Condition: stable Specimens:  Sent serosanguinous fluid Complications: No apparent complications Patient did tolerate procedure well.  He has what looks like a mass lesion that was pushing into the area of the right lower lobe and right middle lobe bronchi but had not ruptured into the bronchus.  Brushings of the area were made and washings of the area were made. Jamese Trauger L 11/30/2017

## 2017-12-05 ENCOUNTER — Encounter (HOSPITAL_COMMUNITY): Payer: Self-pay | Admitting: Pulmonary Disease

## 2017-12-09 DIAGNOSIS — Z6821 Body mass index (BMI) 21.0-21.9, adult: Secondary | ICD-10-CM | POA: Diagnosis not present

## 2017-12-09 DIAGNOSIS — R911 Solitary pulmonary nodule: Secondary | ICD-10-CM | POA: Diagnosis not present

## 2017-12-09 DIAGNOSIS — R112 Nausea with vomiting, unspecified: Secondary | ICD-10-CM | POA: Diagnosis not present

## 2017-12-09 DIAGNOSIS — R1013 Epigastric pain: Secondary | ICD-10-CM | POA: Diagnosis not present

## 2017-12-12 ENCOUNTER — Encounter: Payer: Self-pay | Admitting: Thoracic Surgery (Cardiothoracic Vascular Surgery)

## 2017-12-12 ENCOUNTER — Institutional Professional Consult (permissible substitution) (INDEPENDENT_AMBULATORY_CARE_PROVIDER_SITE_OTHER): Payer: Medicare Other | Admitting: Thoracic Surgery (Cardiothoracic Vascular Surgery)

## 2017-12-12 VITALS — BP 100/56 | HR 64 | Resp 20 | Ht 70.0 in | Wt 141.0 lb

## 2017-12-12 DIAGNOSIS — R911 Solitary pulmonary nodule: Secondary | ICD-10-CM

## 2017-12-12 NOTE — Progress Notes (Signed)
PCP is Rory Percy, MD Referring Provider is Sinda Du, MD  Chief Complaint  Patient presents with  . Lung Lesion    Surgical eval, Bronchoscopy 11/30/17- Dr Luan Pulling, PET Scan 11/28/17, Chest/Abd/Pelvis CTA 11/08/17, CXR 11/08/17   Terry Boyd is sent for consultation regarding a right hilar mass and mediastinal adenopathy.  Terry Boyd is a 77 year old gentleman with a long-standing history of tobacco abuse.  He also has a history of hypertension, COPD, peripheral arterial disease with previous abdominal aortic aneurysm repair and an above-knee to below-knee popliteal bypass, hypothyroidism, arthritis, chronic back pain, and depression.  Recently he has had problems with eating since March of this year.  He complains of some difficulty swallowing and heartburn.  He also complains of early satiety and poor appetite in general.  He is lost about 35 pounds over the past 6 months and 20 pounds over the past 3 months.  He was diagnosed with gastroparesis.  In September he presented with chest pain.  He was admitted for rule out MI.  His enzymes were negative.  As part of his evaluation he had a CT of the chest, abdomen and pelvis.  It showed irregular narrowing of the right lower lobe bronchus and mild right hilar adenopathy with small scattered mediastinal lymph nodes.  A PET/CT showed metabolic activity associated with the right infrahilar masses as well as hilar mediastinal lymph nodes.  He had bronchoscopy by Dr. Luan Pulling.  It showed extrinsic compression of the lower lobe bronchus.  There was no endobronchial lesion.  Brushings and washings were negative.  Zubrod Score: At the time of surgery this patient's most appropriate activity status/level should be described as: _0     0    Normal activity, no symptoms _1     1    Restricted in physical strenuous activity but ambulatory, able to do out light work _2     2    Ambulatory and capable of self care, unable to do work activities, up and  about >50 % of waking hours                              _3     3    Only limited self care, in bed greater than 50% of waking hours _4     4    Completely disabled, no self care, confined to bed or chair _5     5    Moribund   Past Medical History:  Diagnosis Date  . AAA (abdominal aortic aneurysm) ([place])   . Arthritis   . Chronic back pain   . COPD (chronic obstructive pulmonary disease) ([place])   . Depression   . Hypertension   . Hypothyroidism   . Smoker   . Thyroid disease     Past Surgical History:  Procedure Laterality Date  . ABDOMINAL AORTAGRAM N/A 03/14/2012   Procedure: ABDOMINAL Maxcine Ham;  Surgeon: Serafina Mitchell, MD;  Location: [facility] CATH LAB;  Service: Cardiovascular;  Laterality: N/A;  . ABDOMINAL AORTIC ANEURYSM REPAIR  04-22-2009   Stent graft repair of AAA  . APPENDECTOMY    . BRONCHIAL BRUSHINGS Right 11/30/2017   Procedure: BRONCHIAL BRUSHINGS;  Surgeon: Sinda Du, MD;  Location: [facility] ENDO SUITE;  Service: Cardiopulmonary;  Laterality: Right;  . BRONCHIAL WASHINGS Right 11/30/2017   Procedure: BRONCHIAL WASHINGS;  Surgeon: Sinda Du, MD;  Location: [facility];  Service: Cardiopulmonary;  Laterality: Right;  . BYPASS GRAFT POPLITEAL TO POPLITEAL  03/20/2012  Procedure: BYPASS GRAFT POPLITEAL TO POPLITEAL;  Surgeon: Elam Dutch, MD;  Location: [place];  Service: Vascular;  Laterality: Left;  ABOVE THE KNEE POPLITEAL ARTERY TO BELOW THE KNEE POPLITEAL ARTERY BYPASS GRAFT USING REVERSE LEFT GREATER SAPHENOUS VEIN   . ENDARTERECTOMY POPLITEAL  03/20/2012   Procedure: ENDARTERECTOMY POPLITEAL;  Surgeon: Elam Dutch, MD;  Location: [facility] OR;  Service: Vascular;  Laterality: Left;   LIGATION OF LEFT LEG POPLITEAL ANEURYSM  . FLEXIBLE BRONCHOSCOPY N/A 11/30/2017   Procedure: FLEXIBLE BRONCHOSCOPY;  Surgeon: Sinda Du, MD;  Location: [facility];  Service: Cardiopulmonary;  Laterality: N/A;  . INTRAOPERATIVE ARTERIOGRAM  03/20/2012   Procedure: INTRA  OPERATIVE ARTERIOGRAM;  Surgeon: Elam Dutch, MD;  Location: [facility] OR;  Service: Vascular;  Laterality: Left;  TIMES ONE  . VASECTOMY  1982    Family History  Problem Relation Age of Onset  . Diabetes Mother   . Heart disease Father   . Diabetes Father   . Heart disease Sister        CABG x 4  . Cancer Brother        throat cancer    Social History Social History   Tobacco Use  . Smoking status: Current Every Day Smoker    Years: 57.00    Types: Cigars  . Smokeless tobacco: Never Used  . Tobacco comment: pt states he smokes about 20 cigars per day  Substance Use Topics  . Alcohol use: No  . Drug use: No    Current Outpatient Medications  Medication Sig Dispense Refill  . Ipratropium-Albuterol (COMBIVENT RESPIMAT) 20-100 MCG/ACT AERS respimat Inhale 1 puff into the lungs every 6 (six) hours as needed for wheezing.    Marland Kitchen levothyroxine (SYNTHROID, LEVOTHROID) 137 MCG tablet Take 137 mcg by mouth daily before breakfast.     . LORazepam (ATIVAN) 1 MG tablet Take 1 mg by mouth at bedtime.     Marland Kitchen POTASSIUM PO Take 1 tablet by mouth daily.    . sertraline (ZOLOFT) 50 MG tablet Take 50 mg by mouth every morning.     . simvastatin (ZOCOR) 10 MG tablet Take 10 mg by mouth at bedtime.    . metoCLOPramide (REGLAN) 5 MG tablet Take 5 mg by mouth daily before lunch.     No current facility-administered medications for this visit.     Allergies  Allergen Reactions  . Morphine And Related Shortness Of Breath and Other (See Comments)    Hallucinations pt has taken hydromorphone before    Review of Systems  Constitutional: Positive for activity change, appetite change, fatigue and unexpected weight change.  HENT: Positive for dental problem and trouble swallowing. Negative for voice change.   Eyes: Negative for visual disturbance.  Respiratory: Positive for cough. Negative for wheezing.   Gastrointestinal: Positive for abdominal pain, constipation and diarrhea.       Gastroparesis   Genitourinary: Negative for dysuria and hematuria.  Musculoskeletal: Positive for arthralgias and back pain.  Neurological: Positive for dizziness and syncope.  Hematological: Negative for adenopathy. Bruises/bleeds easily.    BP (!) 100/56   Pulse 64   Resp 20   Ht _0  (1.778 m)   Wt 141 lb (64 kg)   SpO2 95% Comment: RA  BMI 20.23 kg/m  Physical Exam  Constitutional: He is oriented to person, place, and time. No distress.  Thenar wasting  HENT:  Head: Normocephalic and atraumatic.  Mouth/Throat: No oropharyngeal exudate.  Eyes: Pupils are equal, round, and  reactive to light. Conjunctivae and EOM are normal. No scleral icterus.  Neck: Neck supple. No thyromegaly present.  Cardiovascular: Normal rate, regular rhythm and normal heart sounds.  No murmur heard. Pulmonary/Chest: Effort normal. He has wheezes (Faint).  Abdominal: Soft. He exhibits no distension. There is no tenderness.  Musculoskeletal: He exhibits no edema.  Lymphadenopathy:    He has no cervical adenopathy.  Neurological: He is alert and oriented to person, place, and time. No cranial nerve deficit. He exhibits normal muscle tone. Coordination normal.  Skin: Skin is warm and dry.  Vitals reviewed.    Diagnostic Tests: NUCLEAR MEDICINE PET SKULL BASE TO THIGH  TECHNIQUE: 10.7 mCi F-18 FDG was injected intravenously. Full-ring PET imaging was performed from the skull base to thigh after the radiotracer. CT data was obtained and used for attenuation correction and anatomic localization.  Fasting blood glucose: 108 mg/dl  COMPARISON:  11/08/2017 CT  FINDINGS: Mediastinal blood pool activity: SUV max 2.1  NECK: Low-level activity in the vicinity of the patient's mandibular dental orthotic is thought to be incidental/physiologic. Likewise, low-grade activity in the upper cervical esophagus with maximum SUV 3.4 is thought to likely be physiologic.  Incidental CT findings: Left common carotid  atherosclerotic calcification.  CHEST: There is hypermetabolic right paratracheal, subcarinal, and right hilar hilar adenopathy associated with the suspected endobronchial lesion of the proximal right inferior lobar bronchus.  An upper right paratracheal node measuring 1.2 cm in short axis on image 73/3 has maximum SUV of 8.0.  A subcarinal node measuring 1.4 cm in short axis on image 91/3 has a maximum SUV of 6.9.  A right hilar node has a maximum SUV of 7.8 and measures 1.3 cm in short axis on image 102/3.  The lesion in the vicinity of the proximal right inferior lobar bronchus has a maximum SUV of 5.5.  There has faintly accentuated metabolic activity in a right upper hilar lymph node and in a precarinal lymph node as well.  Moreover, there are several small anterior mediastinal lymph nodes which are faintly metabolic and just behind the sternum. One of the larger of these prevascular lymph nodes measures 0.9 cm in short axis on image 71/3 with maximum SUV of 2.3.  There is accentuated activity in the distal esophagus without definite CT correlate. Maximum SUV 4.9.  Incidental CT findings: Centrilobular emphysema. Airway thickening and plugging in the right lower lobe. Coronary, aortic arch, and branch vessel atherosclerotic vascular disease.  ABDOMEN/PELVIS: No significant abnormal hypermetabolic activity in this region.  Incidental CT findings: A 2.0 by 1.7 cm mass of the right adrenal gland measures -12 Hounsfield units, compatible with adrenal adenoma, and is not significantly hypermetabolic.  Punctate calcifications in the splenic parenchyma compatible with old granulomatous disease.  Aortoiliac atherosclerotic vascular disease. Aorta bi-iliac graft noted.  Vascular calcifications in the renal hila.  Stomach mildly distended by meal.  Mild sigmoid colon diverticulosis. Scattered physiologic activity in bowel.  SKELETON: No significant  abnormal hypermetabolic activity in this region.  Incidental CT findings: Thoracic kyphosis. Grade 1 degenerative anterolisthesis at L5-S1.  IMPRESSION: 1. The lesion of concern in the vicinity of the proximal right inferior lobar bronchus is hypermetabolic with maximum SUV 5.5. There is also hypermetabolic right hilar and ipsilateral mediastinal adenopathy including right paratracheal, subcarinal, and right hilar lymph nodes with maximum SUV up to 8.0. Finally, there several small prevascular lymph nodes which demonstrate only low-grade metabolic activity better unusually prominent for this location, including a 0.9 cm in short axis  prevascular node on image 71/3 with maximum SUV 2.3. Appearance compatible with malignancy. 2. No findings of metastatic disease/malignancy involving the abdomen/pelvis, neck, or skeleton. 3. Other imaging findings of potential clinical significance: Aortic Atherosclerosis (ICD10-I70.0) and Emphysema (ICD10-J43.9). Coronary atherosclerosis. Airway thickening and plugging in the right lower lobe, potentially postobstructive. Small right adrenal adenoma. Mild sigmoid colon diverticulosis. Thoracic kyphosis.   Electronically Signed   By: Van Clines M.D.   On: 11/29/2017 09:05 I personally reviewed the PET/CT concur with the findings noted above  Impression: Terry Boyd is a 77 year old gentleman who has a long-standing history of tobacco abuse.  He has COPD as well as peripheral arterial disease.  He has no known cardiac disease.  He was recently admitted with chest pain, but ruled out for MI as part of that admission he had a CT of the chest abdomen and pelvis which showed irregular narrowing of the right lower lobe bronchus suspicious for an endobronchial mass.  PET/CT showed hypermetabolic activity in this area as well as hilar and mediastinal lymph nodes.  Findings are suspicious for a stage IIIa non-small cell carcinoma.  Bronchoscopy with  brushings and washings was nondiagnostic.  Differential diagnosis also includes infectious and inflammatory lesions as well as lymphoma.  I recommended to Terry Boyd and his family that we proceed with bronchoscopy, endobronchial ultrasound, and possible mediastinoscopy.  We would only do the mediastinoscopy if the endobronchial ultrasound is nondiagnostic.  They understand that this will be done in the operating room under general anesthesia.  We discussed the indications, risk, benefits, and alternatives.  They understand the risks include, but not limited to death, MI, DVT, PE, stroke, bleeding, possible need for transfusion, possible need for thoracotomy, infection, recurrent nerve injury leading to hoarseness, and pneumothorax.  They also understand there is a possibility of other unforeseeable complications.  He accepts the risks and wishes to proceed.  I doubt that the chest findings account for his gastrointestinal symptoms although there could be some degree of appetite suppression associated with it.  I suspect that gastroparesis is playing a much bigger part in that.  Plan: Bronchoscopy, endobronchial ultrasound, and possible mediastinoscopy on Friday, 12/23/2017  Melrose Nakayama, MD [facility] [phone]

## 2017-12-12 NOTE — H&P (View-Only) (Signed)
PCP is Rory Percy, MD Referring Provider is Sinda Du, MD  Chief Complaint  Patient presents with  . Lung Lesion    Surgical eval, Bronchoscopy 11/30/17- Dr Luan Pulling, PET Scan 11/28/17, Chest/Abd/Pelvis CTA 11/08/17, CXR 11/08/17   Mr. Mehring is sent for consultation regarding a right hilar mass and mediastinal adenopathy.  Terry Boyd is a 77 year old gentleman with a long-standing history of tobacco abuse.  He also has a history of hypertension, COPD, peripheral arterial disease with previous abdominal aortic aneurysm repair and an above-knee to below-knee popliteal bypass, hypothyroidism, arthritis, chronic back pain, and depression.  Recently he has had problems with eating since March of this year.  He complains of some difficulty swallowing and heartburn.  He also complains of early satiety and poor appetite in general.  He is lost about 35 pounds over the past 6 months and 20 pounds over the past 3 months.  He was diagnosed with gastroparesis.  In September he presented with chest pain.  He was admitted for rule out MI.  His enzymes were negative.  As part of his evaluation he had a CT of the chest, abdomen and pelvis.  It showed irregular narrowing of the right lower lobe bronchus and mild right hilar adenopathy with small scattered mediastinal lymph nodes.  A PET/CT showed metabolic activity associated with the right infrahilar masses as well as hilar mediastinal lymph nodes.  He had bronchoscopy by Dr. Luan Pulling.  It showed extrinsic compression of the lower lobe bronchus.  There was no endobronchial lesion.  Brushings and washings were negative.  Zubrod Score: At the time of surgery this patient's most appropriate activity status/level should be described as: _0     0    Normal activity, no symptoms _1     1    Restricted in physical strenuous activity but ambulatory, able to do out light work _2     2    Ambulatory and capable of self care, unable to do work activities, up and  about >50 % of waking hours                              _3     3    Only limited self care, in bed greater than 50% of waking hours _4     4    Completely disabled, no self care, confined to bed or chair _5     5    Moribund   Past Medical History:  Diagnosis Date  . AAA (abdominal aortic aneurysm) ([place])   . Arthritis   . Chronic back pain   . COPD (chronic obstructive pulmonary disease) ([place])   . Depression   . Hypertension   . Hypothyroidism   . Smoker   . Thyroid disease     Past Surgical History:  Procedure Laterality Date  . ABDOMINAL AORTAGRAM N/A 03/14/2012   Procedure: ABDOMINAL Maxcine Ham;  Surgeon: Serafina Mitchell, MD;  Location: [facility] CATH LAB;  Service: Cardiovascular;  Laterality: N/A;  . ABDOMINAL AORTIC ANEURYSM REPAIR  04-22-2009   Stent graft repair of AAA  . APPENDECTOMY    . BRONCHIAL BRUSHINGS Right 11/30/2017   Procedure: BRONCHIAL BRUSHINGS;  Surgeon: Sinda Du, MD;  Location: [facility];  Service: Cardiopulmonary;  Laterality: Right;  . BRONCHIAL WASHINGS Right 11/30/2017   Procedure: BRONCHIAL WASHINGS;  Surgeon: Sinda Du, MD;  Location: [facility];  Service: Cardiopulmonary;  Laterality: Right;  . BYPASS GRAFT POPLITEAL TO POPLITEAL  03/20/2012  Procedure: BYPASS GRAFT POPLITEAL TO POPLITEAL;  Surgeon: Charles E Fields, MD;  Location: [facility] OR;  Service: Vascular;  Laterality: Left;  ABOVE THE KNEE POPLITEAL ARTERY TO BELOW THE KNEE POPLITEAL ARTERY BYPASS GRAFT USING REVERSE LEFT GREATER SAPHENOUS VEIN   . ENDARTERECTOMY POPLITEAL  03/20/2012   Procedure: ENDARTERECTOMY POPLITEAL;  Surgeon: Charles E Fields, MD;  Location: [facility] OR;  Service: Vascular;  Laterality: Left;   LIGATION OF LEFT LEG POPLITEAL ANEURYSM  . FLEXIBLE BRONCHOSCOPY N/A 11/30/2017   Procedure: FLEXIBLE BRONCHOSCOPY;  Surgeon: Hawkins, Edward, MD;  Location: [facility];  Service: Cardiopulmonary;  Laterality: N/A;  . INTRAOPERATIVE ARTERIOGRAM  03/20/2012   Procedure: INTRA  OPERATIVE ARTERIOGRAM;  Surgeon: Charles E Fields, MD;  Location: [facility] OR;  Service: Vascular;  Laterality: Left;  TIMES ONE  . VASECTOMY  1982    Family History  Problem Relation Age of Onset  . Diabetes Mother   . Heart disease Father   . Diabetes Father   . Heart disease Sister        CABG x 4  . Cancer Brother        throat cancer    Social History Social History   Tobacco Use  . Smoking status: Current Every Day Smoker    Years: 57.00    Types: Cigars  . Smokeless tobacco: Never Used  . Tobacco comment: pt states he smokes about 20 cigars per day  Substance Use Topics  . Alcohol use: No  . Drug use: No    Current Outpatient Medications  Medication Sig Dispense Refill  . Ipratropium-Albuterol (COMBIVENT RESPIMAT) 20-100 MCG/ACT AERS respimat Inhale 1 puff into the lungs every 6 (six) hours as needed for wheezing.    . levothyroxine (SYNTHROID, LEVOTHROID) 137 MCG tablet Take 137 mcg by mouth daily before breakfast.     . LORazepam (ATIVAN) 1 MG tablet Take 1 mg by mouth at bedtime.     . POTASSIUM PO Take 1 tablet by mouth daily.    . sertraline (ZOLOFT) 50 MG tablet Take 50 mg by mouth every morning.     . simvastatin (ZOCOR) 10 MG tablet Take 10 mg by mouth at bedtime.    . metoCLOPramide (REGLAN) 5 MG tablet Take 5 mg by mouth daily before lunch.     No current facility-administered medications for this visit.     Allergies  Allergen Reactions  . Morphine And Related Shortness Of Breath and Other (See Comments)    Hallucinations pt has taken hydromorphone before    Review of Systems  Constitutional: Positive for activity change, appetite change, fatigue and unexpected weight change.  HENT: Positive for dental problem and trouble swallowing. Negative for voice change.   Eyes: Negative for visual disturbance.  Respiratory: Positive for cough. Negative for wheezing.   Gastrointestinal: Positive for abdominal pain, constipation and diarrhea.       Gastroparesis   Genitourinary: Negative for dysuria and hematuria.  Musculoskeletal: Positive for arthralgias and back pain.  Neurological: Positive for dizziness and syncope.  Hematological: Negative for adenopathy. Bruises/bleeds easily.    BP (!) 100/56   Pulse 64   Resp 20   Ht 5' 10" (1.778 m)   Wt 141 lb (64 kg)   SpO2 95% Comment: RA  BMI 20.23 kg/m  Physical Exam  Constitutional: He is oriented to person, place, and time. No distress.  Thenar wasting  HENT:  Head: Normocephalic and atraumatic.  Mouth/Throat: No oropharyngeal exudate.  Eyes: Pupils are equal, round, and   reactive to light. Conjunctivae and EOM are normal. No scleral icterus.  Neck: Neck supple. No thyromegaly present.  Cardiovascular: Normal rate, regular rhythm and normal heart sounds.  No murmur heard. Pulmonary/Chest: Effort normal. He has wheezes (Faint).  Abdominal: Soft. He exhibits no distension. There is no tenderness.  Musculoskeletal: He exhibits no edema.  Lymphadenopathy:    He has no cervical adenopathy.  Neurological: He is alert and oriented to person, place, and time. No cranial nerve deficit. He exhibits normal muscle tone. Coordination normal.  Skin: Skin is warm and dry.  Vitals reviewed.    Diagnostic Tests: NUCLEAR MEDICINE PET SKULL BASE TO THIGH  TECHNIQUE: 10.7 mCi F-18 FDG was injected intravenously. Full-ring PET imaging was performed from the skull base to thigh after the radiotracer. CT data was obtained and used for attenuation correction and anatomic localization.  Fasting blood glucose: 108 mg/dl  COMPARISON:  11/08/2017 CT  FINDINGS: Mediastinal blood pool activity: SUV max 2.1  NECK: Low-level activity in the vicinity of the patient's mandibular dental orthotic is thought to be incidental/physiologic. Likewise, low-grade activity in the upper cervical esophagus with maximum SUV 3.4 is thought to likely be physiologic.  Incidental CT findings: Left common carotid  atherosclerotic calcification.  CHEST: There is hypermetabolic right paratracheal, subcarinal, and right hilar hilar adenopathy associated with the suspected endobronchial lesion of the proximal right inferior lobar bronchus.  An upper right paratracheal node measuring 1.2 cm in short axis on image 73/3 has maximum SUV of 8.0.  A subcarinal node measuring 1.4 cm in short axis on image 91/3 has a maximum SUV of 6.9.  A right hilar node has a maximum SUV of 7.8 and measures 1.3 cm in short axis on image 102/3.  The lesion in the vicinity of the proximal right inferior lobar bronchus has a maximum SUV of 5.5.  There has faintly accentuated metabolic activity in a right upper hilar lymph node and in a precarinal lymph node as well.  Moreover, there are several small anterior mediastinal lymph nodes which are faintly metabolic and just behind the sternum. One of the larger of these prevascular lymph nodes measures 0.9 cm in short axis on image 71/3 with maximum SUV of 2.3.  There is accentuated activity in the distal esophagus without definite CT correlate. Maximum SUV 4.9.  Incidental CT findings: Centrilobular emphysema. Airway thickening and plugging in the right lower lobe. Coronary, aortic arch, and branch vessel atherosclerotic vascular disease.  ABDOMEN/PELVIS: No significant abnormal hypermetabolic activity in this region.  Incidental CT findings: A 2.0 by 1.7 cm mass of the right adrenal gland measures -12 Hounsfield units, compatible with adrenal adenoma, and is not significantly hypermetabolic.  Punctate calcifications in the splenic parenchyma compatible with old granulomatous disease.  Aortoiliac atherosclerotic vascular disease. Aorta bi-iliac graft noted.  Vascular calcifications in the renal hila.  Stomach mildly distended by meal.  Mild sigmoid colon diverticulosis. Scattered physiologic activity in bowel.  SKELETON: No significant  abnormal hypermetabolic activity in this region.  Incidental CT findings: Thoracic kyphosis. Grade 1 degenerative anterolisthesis at L5-S1.  IMPRESSION: 1. The lesion of concern in the vicinity of the proximal right inferior lobar bronchus is hypermetabolic with maximum SUV 5.5. There is also hypermetabolic right hilar and ipsilateral mediastinal adenopathy including right paratracheal, subcarinal, and right hilar lymph nodes with maximum SUV up to 8.0. Finally, there several small prevascular lymph nodes which demonstrate only low-grade metabolic activity better unusually prominent for this location, including a 0.9 cm in short axis  prevascular node on image 71/3 with maximum SUV 2.3. Appearance compatible with malignancy. 2. No findings of metastatic disease/malignancy involving the abdomen/pelvis, neck, or skeleton. 3. Other imaging findings of potential clinical significance: Aortic Atherosclerosis (ICD10-I70.0) and Emphysema (ICD10-J43.9). Coronary atherosclerosis. Airway thickening and plugging in the right lower lobe, potentially postobstructive. Small right adrenal adenoma. Mild sigmoid colon diverticulosis. Thoracic kyphosis.   Electronically Signed   By: Van Clines M.D.   On: 11/29/2017 09:05 I personally reviewed the PET/CT concur with the findings noted above  Impression: Terry Boyd is a 77 year old gentleman who has a long-standing history of tobacco abuse.  He has COPD as well as peripheral arterial disease.  He has no known cardiac disease.  He was recently admitted with chest pain, but ruled out for MI as part of that admission he had a CT of the chest abdomen and pelvis which showed irregular narrowing of the right lower lobe bronchus suspicious for an endobronchial mass.  PET/CT showed hypermetabolic activity in this area as well as hilar and mediastinal lymph nodes.  Findings are suspicious for a stage IIIa non-small cell carcinoma.  Bronchoscopy with  brushings and washings was nondiagnostic.  Differential diagnosis also includes infectious and inflammatory lesions as well as lymphoma.  I recommended to Mr. Mcnelly and his family that we proceed with bronchoscopy, endobronchial ultrasound, and possible mediastinoscopy.  We would only do the mediastinoscopy if the endobronchial ultrasound is nondiagnostic.  They understand that this will be done in the operating room under general anesthesia.  We discussed the indications, risk, benefits, and alternatives.  They understand the risks include, but not limited to death, MI, DVT, PE, stroke, bleeding, possible need for transfusion, possible need for thoracotomy, infection, recurrent nerve injury leading to hoarseness, and pneumothorax.  They also understand there is a possibility of other unforeseeable complications.  He accepts the risks and wishes to proceed.  I doubt that the chest findings account for his gastrointestinal symptoms although there could be some degree of appetite suppression associated with it.  I suspect that gastroparesis is playing a much bigger part in that.  Plan: Bronchoscopy, endobronchial ultrasound, and possible mediastinoscopy on Friday, 12/23/2017  Melrose Nakayama, MD [facility] [phone]

## 2017-12-13 ENCOUNTER — Other Ambulatory Visit: Payer: Self-pay | Admitting: *Deleted

## 2017-12-13 DIAGNOSIS — R918 Other nonspecific abnormal finding of lung field: Secondary | ICD-10-CM

## 2017-12-13 DIAGNOSIS — R599 Enlarged lymph nodes, unspecified: Secondary | ICD-10-CM

## 2017-12-13 DIAGNOSIS — R591 Generalized enlarged lymph nodes: Secondary | ICD-10-CM

## 2017-12-21 ENCOUNTER — Encounter: Payer: Self-pay | Admitting: Thoracic Surgery (Cardiothoracic Vascular Surgery)

## 2017-12-22 ENCOUNTER — Other Ambulatory Visit: Payer: Self-pay

## 2017-12-22 ENCOUNTER — Encounter (HOSPITAL_COMMUNITY): Payer: Self-pay | Admitting: *Deleted

## 2017-12-22 NOTE — Progress Notes (Signed)
Spoke with pt for pre-op call. Pt denies cardiac history, chest pain or sob. Pt states he is not diabetic. 

## 2017-12-23 ENCOUNTER — Ambulatory Visit (HOSPITAL_COMMUNITY): Payer: Medicare Other

## 2017-12-23 ENCOUNTER — Ambulatory Visit (HOSPITAL_COMMUNITY): Payer: Medicare Other | Admitting: Registered Nurse

## 2017-12-23 ENCOUNTER — Encounter (HOSPITAL_COMMUNITY): Payer: Self-pay

## 2017-12-23 ENCOUNTER — Encounter (HOSPITAL_COMMUNITY)
Admission: RE | Disposition: A | Discharge status: Home / Self Care | Payer: Self-pay | Source: Ambulatory Visit | Attending: Thoracic Surgery (Cardiothoracic Vascular Surgery)

## 2017-12-23 ENCOUNTER — Ambulatory Visit (HOSPITAL_COMMUNITY)
Admission: RE | Admit: 2017-12-23 | Discharge: 2017-12-23 | Disposition: A | Discharge status: Home / Self Care | Payer: Medicare Other | Source: Ambulatory Visit | Attending: Thoracic Surgery (Cardiothoracic Vascular Surgery) | Admitting: Thoracic Surgery (Cardiothoracic Vascular Surgery)

## 2017-12-23 DIAGNOSIS — R896 Abnormal cytological findings in specimens from other organs, systems and tissues: Secondary | ICD-10-CM | POA: Diagnosis not present

## 2017-12-23 DIAGNOSIS — F329 Major depressive disorder, single episode, unspecified: Secondary | ICD-10-CM | POA: Diagnosis not present

## 2017-12-23 DIAGNOSIS — Z885 Allergy status to narcotic agent status: Secondary | ICD-10-CM | POA: Insufficient documentation

## 2017-12-23 DIAGNOSIS — R918 Other nonspecific abnormal finding of lung field: Secondary | ICD-10-CM

## 2017-12-23 DIAGNOSIS — F17290 Nicotine dependence, other tobacco product, uncomplicated: Secondary | ICD-10-CM | POA: Diagnosis not present

## 2017-12-23 DIAGNOSIS — K5730 Diverticulosis of large intestine without perforation or abscess without bleeding: Secondary | ICD-10-CM | POA: Diagnosis not present

## 2017-12-23 DIAGNOSIS — Z79899 Other long term (current) drug therapy: Secondary | ICD-10-CM | POA: Insufficient documentation

## 2017-12-23 DIAGNOSIS — C3431 Malignant neoplasm of lower lobe, right bronchus or lung: Secondary | ICD-10-CM | POA: Diagnosis not present

## 2017-12-23 DIAGNOSIS — D3501 Benign neoplasm of right adrenal gland: Secondary | ICD-10-CM | POA: Insufficient documentation

## 2017-12-23 DIAGNOSIS — R599 Enlarged lymph nodes, unspecified: Secondary | ICD-10-CM

## 2017-12-23 DIAGNOSIS — I2510 Atherosclerotic heart disease of native coronary artery without angina pectoris: Secondary | ICD-10-CM | POA: Insufficient documentation

## 2017-12-23 DIAGNOSIS — R591 Generalized enlarged lymph nodes: Secondary | ICD-10-CM

## 2017-12-23 DIAGNOSIS — R109 Unspecified abdominal pain: Secondary | ICD-10-CM | POA: Insufficient documentation

## 2017-12-23 DIAGNOSIS — J449 Chronic obstructive pulmonary disease, unspecified: Secondary | ICD-10-CM | POA: Insufficient documentation

## 2017-12-23 DIAGNOSIS — K3184 Gastroparesis: Secondary | ICD-10-CM | POA: Insufficient documentation

## 2017-12-23 DIAGNOSIS — R590 Localized enlarged lymph nodes: Secondary | ICD-10-CM | POA: Diagnosis not present

## 2017-12-23 DIAGNOSIS — E039 Hypothyroidism, unspecified: Secondary | ICD-10-CM | POA: Insufficient documentation

## 2017-12-23 DIAGNOSIS — I10 Essential (primary) hypertension: Secondary | ICD-10-CM | POA: Insufficient documentation

## 2017-12-23 DIAGNOSIS — R846 Abnormal cytological findings in specimens from respiratory organs and thorax: Secondary | ICD-10-CM | POA: Diagnosis not present

## 2017-12-23 DIAGNOSIS — R222 Localized swelling, mass and lump, trunk: Secondary | ICD-10-CM | POA: Diagnosis not present

## 2017-12-23 HISTORY — PX: VIDEO BRONCHOSCOPY WITH ENDOBRONCHIAL ULTRASOUND: SHX6177

## 2017-12-23 HISTORY — DX: Peripheral vascular disease, unspecified: I73.9

## 2017-12-23 HISTORY — DX: Personal history of other diseases of the digestive system: Z87.19

## 2017-12-23 HISTORY — DX: Other specified symptoms and signs involving the digestive system and abdomen: R19.8

## 2017-12-23 HISTORY — DX: Gastro-esophageal reflux disease without esophagitis: K21.9

## 2017-12-23 LAB — COMPREHENSIVE METABOLIC PANEL
ALBUMIN: 3.6 g/dL (ref 3.5–5.0)
ALK PHOS: 61 U/L (ref 38–126)
ALT: 15 U/L (ref 0–44)
AST: 20 U/L (ref 15–41)
Anion gap: 8 (ref 5–15)
BUN: 14 mg/dL (ref 8–23)
CALCIUM: 9 mg/dL (ref 8.9–10.3)
CO2: 24 mmol/L (ref 22–32)
CREATININE: 1.04 mg/dL (ref 0.61–1.24)
Chloride: 106 mmol/L (ref 98–111)
GFR calc Af Amer: >60 mL/min (ref >60)
GFR calc non Af Amer: >60 mL/min (ref >60)
GLUCOSE: 102 mg/dL — AB (ref 70–99)
Potassium: 4.1 mmol/L (ref 3.5–5.1)
SODIUM: 138 mmol/L (ref 135–145)
Total Bilirubin: 0.7 mg/dL (ref 0.3–1.2)
Total Protein: 6.7 g/dL (ref 6.5–8.1)

## 2017-12-23 LAB — CBC
HCT: 46.6 % (ref 39.0–52.0)
HEMOGLOBIN: 14.9 g/dL (ref 13.0–17.0)
MCH: 31 pg (ref 26.0–34.0)
MCHC: 32 g/dL (ref 30.0–36.0)
MCV: 96.9 fL (ref 80.0–100.0)
Platelets: 235 10*3/uL (ref 150–400)
RBC: 4.81 MIL/uL (ref 4.22–5.81)
RDW: 12.8 % (ref 11.5–15.5)
WBC: 11 10*3/uL — AB (ref 4.0–10.5)
nRBC: 0 % (ref 0.0–0.2)

## 2017-12-23 LAB — TYPE AND SCREEN
ABO/RH(D): A POS
Antibody Screen: NEGATIVE

## 2017-12-23 LAB — APTT: APTT: 27 s (ref 24–36)

## 2017-12-23 LAB — PROTIME-INR
INR: 1.1
Prothrombin Time: 14.1 seconds (ref 11.4–15.2)

## 2017-12-23 SURGERY — BRONCHOSCOPY, WITH EBUS
Anesthesia: General | Site: Chest

## 2017-12-23 MED ORDER — LIDOCAINE 2% (20 MG/ML) 5 ML SYRINGE
INTRAMUSCULAR | Status: AC
  Filled 2017-12-23: qty 5

## 2017-12-23 MED ORDER — ONDANSETRON HCL 4 MG/2ML IJ SOLN: 4.0000 mg | Freq: Once | INTRAMUSCULAR | Status: DC | PRN

## 2017-12-23 MED ORDER — PROPOFOL 10 MG/ML IV BOLUS
INTRAVENOUS | Status: DC | PRN
  Administered 2017-12-23: 120 mg via INTRAVENOUS

## 2017-12-23 MED ORDER — PROPOFOL 10 MG/ML IV BOLUS
INTRAVENOUS | Status: AC
  Filled 2017-12-23: qty 20

## 2017-12-23 MED ORDER — FENTANYL CITRATE (PF) 100 MCG/2ML IJ SOLN
INTRAMUSCULAR | Status: DC | PRN
  Administered 2017-12-23: 100 ug via INTRAVENOUS
  Administered 2017-12-23: 50 ug via INTRAVENOUS

## 2017-12-23 MED ORDER — SUGAMMADEX SODIUM 200 MG/2ML IV SOLN
INTRAVENOUS | Status: DC | PRN
  Administered 2017-12-23 (×2): 50 mg via INTRAVENOUS
  Administered 2017-12-23: 100 mg via INTRAVENOUS

## 2017-12-23 MED ORDER — DEXAMETHASONE SODIUM PHOSPHATE 10 MG/ML IJ SOLN
INTRAMUSCULAR | Status: DC | PRN
  Administered 2017-12-23: 10 mg via INTRAVENOUS

## 2017-12-23 MED ORDER — ROCURONIUM BROMIDE 50 MG/5ML IV SOSY
PREFILLED_SYRINGE | INTRAVENOUS | Status: DC | PRN
  Administered 2017-12-23: 40 mg via INTRAVENOUS

## 2017-12-23 MED ORDER — HYDROCODONE-ACETAMINOPHEN 7.5-325 MG PO TABS: 1.0000 | ORAL_TABLET | Freq: Once | ORAL | Status: DC | PRN

## 2017-12-23 MED ORDER — LACTATED RINGERS IV SOLN
INTRAVENOUS | Status: DC | PRN
  Administered 2017-12-23: 11:00:00 via INTRAVENOUS

## 2017-12-23 MED ORDER — MEPERIDINE HCL 50 MG/ML IJ SOLN: 6.2500 mg | INTRAMUSCULAR | Status: DC | PRN

## 2017-12-23 MED ORDER — EPINEPHRINE PF 1 MG/ML IJ SOLN
INTRAMUSCULAR | Status: AC
  Filled 2017-12-23: qty 1

## 2017-12-23 MED ORDER — EPHEDRINE SULFATE-NACL 50-0.9 MG/10ML-% IV SOSY
PREFILLED_SYRINGE | INTRAVENOUS | Status: DC | PRN
  Administered 2017-12-23: 10 mg via INTRAVENOUS
  Administered 2017-12-23 (×3): 5 mg via INTRAVENOUS

## 2017-12-23 MED ORDER — MIDAZOLAM HCL 2 MG/2ML IJ SOLN
INTRAMUSCULAR | Status: AC
  Filled 2017-12-23: qty 2

## 2017-12-23 MED ORDER — LIDOCAINE 2% (20 MG/ML) 5 ML SYRINGE
INTRAMUSCULAR | Status: DC | PRN
  Administered 2017-12-23: 80 mg via INTRAVENOUS

## 2017-12-23 MED ORDER — FENTANYL CITRATE (PF) 250 MCG/5ML IJ SOLN
INTRAMUSCULAR | Status: AC
  Filled 2017-12-23: qty 5

## 2017-12-23 MED ORDER — EPINEPHRINE PF 1 MG/ML IJ SOLN
INTRAMUSCULAR | Status: DC | PRN
  Administered 2017-12-23: 1 mg

## 2017-12-23 MED ORDER — FENTANYL CITRATE (PF) 100 MCG/2ML IJ SOLN: 25.0000 ug | INTRAMUSCULAR | Status: DC | PRN

## 2017-12-23 MED ORDER — ONDANSETRON HCL 4 MG/2ML IJ SOLN
INTRAMUSCULAR | Status: DC | PRN
  Administered 2017-12-23: 4 mg via INTRAVENOUS

## 2017-12-23 MED ORDER — 0.9 % SODIUM CHLORIDE (POUR BTL) OPTIME
TOPICAL | Status: DC | PRN
  Administered 2017-12-23: 1000 mL

## 2017-12-23 MED ORDER — PHENYLEPHRINE 40 MCG/ML (10ML) SYRINGE FOR IV PUSH (FOR BLOOD PRESSURE SUPPORT)
PREFILLED_SYRINGE | INTRAVENOUS | Status: DC | PRN
  Administered 2017-12-23 (×3): 80 ug via INTRAVENOUS

## 2017-12-23 MED ORDER — CEFAZOLIN SODIUM-DEXTROSE 2-4 GM/100ML-% IV SOLN
2.0000 g | INTRAVENOUS | Status: AC
  Administered 2017-12-23: 2 g via INTRAVENOUS
  Filled 2017-12-23: qty 100

## 2017-12-23 SURGICAL SUPPLY — 64 items
ADAPTER VALVE BIOPSY EBUS (MISCELLANEOUS) IMPLANT
ADH SKN CLS APL DERMABOND .7 (GAUZE/BANDAGES/DRESSINGS) ×2
ADPTR VALVE BIOPSY EBUS (MISCELLANEOUS)
APPLIER CLIP LOGIC TI 5 (MISCELLANEOUS) IMPLANT
APR CLP MED LRG 33X5 (MISCELLANEOUS)
BLADE SURG 15 STRL LF DISP TIS (BLADE) ×2 IMPLANT
BLADE SURG 15 STRL SS (BLADE) ×3
BRUSH CYTOL CELLEBRITY 1.5X140 (MISCELLANEOUS) ×4 IMPLANT
CANISTER SUCT 3000ML PPV (MISCELLANEOUS) ×8 IMPLANT
CLIP VESOCCLUDE MED 6/CT (CLIP) ×4 IMPLANT
CONT SPEC 4OZ CLIKSEAL STRL BL (MISCELLANEOUS) ×12 IMPLANT
COVER BACK TABLE 60X90IN (DRAPES) ×4 IMPLANT
COVER SURGICAL LIGHT HANDLE (MISCELLANEOUS) ×8 IMPLANT
COVER WAND RF STERILE (DRAPES) ×4 IMPLANT
DERMABOND ADVANCED (GAUZE/BANDAGES/DRESSINGS) ×2
DERMABOND ADVANCED .7 DNX12 (GAUZE/BANDAGES/DRESSINGS) ×2 IMPLANT
DRAPE CHEST BREAST 15X10 FENES (DRAPES) ×4 IMPLANT
ELECT REM PT RETURN 9FT ADLT (ELECTROSURGICAL) ×4
ELECTRODE REM PT RTRN 9FT ADLT (ELECTROSURGICAL) ×2 IMPLANT
FILTER STRAW FLUID ASPIR (MISCELLANEOUS) IMPLANT
FORCEPS BIOP RJ4 1.8 (CUTTING FORCEPS) IMPLANT
FORCEPS RADIAL JAW LRG 4 PULM (INSTRUMENTS) ×2 IMPLANT
GAUZE 4X4 16PLY RFD (DISPOSABLE) ×4 IMPLANT
GAUZE SPONGE 4X4 12PLY STRL (GAUZE/BANDAGES/DRESSINGS) IMPLANT
GLOVE SURG SIGNA 7.5 PF LTX (GLOVE) ×8 IMPLANT
GOWN STRL REUS W/ TWL LRG LVL3 (GOWN DISPOSABLE) ×2 IMPLANT
GOWN STRL REUS W/ TWL XL LVL3 (GOWN DISPOSABLE) ×4 IMPLANT
GOWN STRL REUS W/TWL LRG LVL3 (GOWN DISPOSABLE) ×2
GOWN STRL REUS W/TWL XL LVL3 (GOWN DISPOSABLE) ×8
HEMOSTAT SURGICEL 2X14 (HEMOSTASIS) IMPLANT
KIT BASIN OR (CUSTOM PROCEDURE TRAY) ×4 IMPLANT
KIT CLEAN ENDO COMPLIANCE (KITS) ×8 IMPLANT
KIT TURNOVER KIT B (KITS) ×8 IMPLANT
MARKER SKIN DUAL TIP RULER LAB (MISCELLANEOUS) ×4 IMPLANT
NEEDLE ASPIRATION VIZISHOT 19G (NEEDLE) ×4 IMPLANT
NEEDLE ASPIRATION VIZISHOT 21G (NEEDLE) IMPLANT
NEEDLE BLUNT 18X1 FOR OR ONLY (NEEDLE) IMPLANT
NS IRRIG 1000ML POUR BTL (IV SOLUTION) ×8 IMPLANT
OIL SILICONE PENTAX (PARTS (SERVICE/REPAIRS)) ×4 IMPLANT
PACK GENERAL/GYN (CUSTOM PROCEDURE TRAY) ×4 IMPLANT
PAD ARMBOARD 7.5X6 YLW CONV (MISCELLANEOUS) ×16 IMPLANT
RADIAL JAW LRG 4 PULMONARY (INSTRUMENTS) ×2
SPONGE INTESTINAL PEANUT (DISPOSABLE) IMPLANT
SUT SILK 2 0 (SUTURE)
SUT SILK 2-0 18XBRD TIE 12 (SUTURE) IMPLANT
SUT VIC AB 2-0 CT1 27 (SUTURE)
SUT VIC AB 2-0 CT1 TAPERPNT 27 (SUTURE) IMPLANT
SUT VIC AB 3-0 SH 18 (SUTURE) ×4 IMPLANT
SUT VICRYL 4-0 PS2 18IN ABS (SUTURE) ×4 IMPLANT
SYR 10ML LL (SYRINGE) ×4 IMPLANT
SYR 20CC LL (SYRINGE) ×4 IMPLANT
SYR 20ML ECCENTRIC (SYRINGE) ×4 IMPLANT
SYR 3ML LL SCALE MARK (SYRINGE) ×4 IMPLANT
SYR 5ML LL (SYRINGE) ×4 IMPLANT
SYR 5ML LUER SLIP (SYRINGE) ×4 IMPLANT
TOWEL GREEN STERILE (TOWEL DISPOSABLE) ×8 IMPLANT
TOWEL GREEN STERILE FF (TOWEL DISPOSABLE) ×8 IMPLANT
TRAP SPECIMEN MUCOUS 40CC (MISCELLANEOUS) ×4 IMPLANT
TUBE CONNECTING 20'X1/4 (TUBING) ×2
TUBE CONNECTING 20X1/4 (TUBING) ×6 IMPLANT
VALVE BIOPSY  SINGLE USE (MISCELLANEOUS) ×2
VALVE BIOPSY SINGLE USE (MISCELLANEOUS) ×2 IMPLANT
VALVE SUCTION BRONCHIO DISP (MISCELLANEOUS) ×4 IMPLANT
WATER STERILE IRR 1000ML POUR (IV SOLUTION) ×8 IMPLANT

## 2017-12-23 NOTE — Brief Op Note (Signed)
12/23/2017  1:05 PM  PATIENT:  Terry Boyd  77 y.o. male  PRE-OPERATIVE DIAGNOSIS:  RIGHT HILAR MASS ADENOPATHY  POST-OPERATIVE DIAGNOSIS:  RIGHT HILAR MASS ADENOPATHY  PROCEDURE:  Procedure(s): VIDEO BRONCHOSCOPY WITH ENDOBRONCHIAL ULTRASOUND (N/A)- brushings and endobronchial biopsies  SURGEON:  Surgeon(s) and Role:    * Melrose Nakayama, MD - Primary  PHYSICIAN ASSISTANT:   ASSISTANTS: none   ANESTHESIA:   general  EBL:  10 mL   BLOOD ADMINISTERED:none  DRAINS: none   LOCAL MEDICATIONS USED:  NONE  SPECIMEN:  Source of Specimen:  level 7 node, Right lower lobe bronchus  DISPOSITION OF SPECIMEN:  PATHOLOGY  COUNTS:  NO endoscopic  TOURNIQUET:  * No tourniquets in log *  DICTATION: .Other Dictation: Dictation Number -  PLAN OF CARE: Discharge to home after PACU  PATIENT DISPOSITION:  PACU - hemodynamically stable.   Delay start of Pharmacological VTE agent (>24hrs) due to surgical blood loss or risk of bleeding: not applicable

## 2017-12-23 NOTE — Anesthesia Procedure Notes (Signed)
Procedure Name: Intubation Date/Time: 12/23/2017 11:08 AM Performed by: Trinna Post., CRNA Pre-anesthesia Checklist: Patient identified, Emergency Drugs available, Suction available, Patient being monitored and Timeout performed Patient Re-evaluated:Patient Re-evaluated prior to induction Oxygen Delivery Method: Circle system utilized Preoxygenation: Pre-oxygenation with 100% oxygen Induction Type: IV induction Ventilation: Mask ventilation without difficulty Laryngoscope Size: Mac and 4 Grade View: Grade I Tube type: Oral Tube size: 8.5 mm Number of attempts: 1 Airway Equipment and Method: Stylet Placement Confirmation: ETT inserted through vocal cords under direct vision,  positive ETCO2 and breath sounds checked- equal and bilateral Secured at: 21 cm Tube secured with: Tape Dental Injury: Teeth and Oropharynx as per pre-operative assessment

## 2017-12-23 NOTE — Transfer of Care (Signed)
Immediate Anesthesia Transfer of Care Note  Patient: Terry Boyd  Procedure(s) Performed: VIDEO BRONCHOSCOPY WITH ENDOBRONCHIAL ULTRASOUND (N/A Chest)  Patient Location: PACU  Anesthesia Type:General  Level of Consciousness: awake, alert  and oriented  Airway & Oxygen Therapy: Patient Spontanous Breathing and Patient connected to face mask oxygen  Post-op Assessment: Report given to RN and Post -op Vital signs reviewed and stable  Post vital signs: Reviewed and stable  Last Vitals:  Vitals Value Taken Time  BP 118/60 12/23/2017 12:15 PM  Temp 36.5 C 12/23/2017 12:16 PM  Pulse 71 12/23/2017 12:16 PM  Resp 13 12/23/2017 12:16 PM  SpO2 98 % 12/23/2017 12:16 PM  Vitals shown include unvalidated device data.  Last Pain:  Vitals:   12/23/17 1216  TempSrc:   PainSc: 0-No pain      Patients Stated Pain Goal: 9 (53/91/22 [id number])  Complications: No apparent anesthesia complications

## 2017-12-23 NOTE — Anesthesia Postprocedure Evaluation (Signed)
Anesthesia Post Note  Patient: Terry Boyd  Procedure(s) Performed: VIDEO BRONCHOSCOPY WITH ENDOBRONCHIAL ULTRASOUND (N/A Chest)     Patient location during evaluation: PACU Anesthesia Type: General Level of consciousness: awake and alert Pain management: pain level controlled Vital Signs Assessment: post-procedure vital signs reviewed and stable Respiratory status: spontaneous breathing, nonlabored ventilation and respiratory function stable Cardiovascular status: blood pressure returned to baseline and stable Postop Assessment: no apparent nausea or vomiting Anesthetic complications: no    Last Vitals:  Vitals:   12/23/17 1245 12/23/17 1300  BP: (!) 90/54 (!) 94/56  Pulse: 64 66  Resp: 17 14  Temp:    SpO2: 91% 92%    Last Pain:  Vitals:   12/23/17 1245  TempSrc:   PainSc: 0-No pain                 Alfons Sulkowski A.

## 2017-12-23 NOTE — Op Note (Signed)
NAME: Terry Boyd, Terry Boyd MEDICAL RECORD WU:[id number] ACCOUNT [id number] DATE OF BIRTH:1940/08/31 FACILITY: [facility] LOCATION: [facility] PHYSICIAN:Kameria Canizares Chaya Jan, MD  OPERATIVE REPORT  DATE OF PROCEDURE:  12/23/2017  PREOPERATIVE DIAGNOSIS:  Right hilar mass with mediastinal adenopathy.  POSTOPERATIVE DIAGNOSIS:  Right hilar mass with mediastinal adenopathy.  PROCEDURE:   1. Video bronchoscopy with brushings and endobronchial biopsies.  2. Endobronchial ultrasound with mediastinal lymph node aspirations.  SURGEON:  Modesto Charon, MD  ASSISTANT:  None.  ANESTHESIA:  General.  FINDINGS:  Extensive lymphoid cells seen on both brushings and needle aspirations, suspicion for possible lymphoma.  CLINICAL NOTE:  The patient is a 77 year old gentleman with a long history of tobacco abuse who presented with a significant weight loss over the past 6 months.  He was evaluated for chest pain and a CT of the chest showed narrowing of the right lower  lobe bronchus and some right hilar adenopathy.  A PET CT showed hypermetabolic activity in the right infrahilar mass as well as the hilar and mediastinal lymph nodes.  Bronchoscopy showed extrinsic compression of the lower lobe bronchus.  There were no  endobronchial lesions seen.  Brushings and washings were negative.  The patient was advised to undergo bronchoscopy and endobronchial ultrasound for diagnostic and staging purposes.  The indications, risks, benefits, and alternatives were discussed in detail with the patient.  He understood and accepted the risks and  agreed to proceed.  DESCRIPTION OF PROCEDURE:  The patient was brought to the operating room on 12/23/2017.  He had induction of general anesthesia and was intubated.  Sequential compression devices were placed on the calves for DVT prophylaxis.  A timeout was performed.   Flexible fiberoptic bronchoscopy was performed.  It revealed an endobronchial lesion obscuring the takeoff  of the superior segmental bronchus of the right lower lobe.  The bronchoscope was removed.  The endobronchial ultrasound probe was placed.  It was  advanced to the subcarinal space.  There were 2 large nodes adjacent to each other in the subcarinal space.  Multiple aspirations were obtained from these nodes.  Aspirations were performed with ultrasound visualization with 15 to 20 passes in the node  with each aspiration and were done both with and without suction applied.  A Quick stain of the lesion showed lymphoid cells.  The bronchoscope was replaced and brushings were performed of the endobronchial lesion in the superior segment of the right lower lobe.  Quick stain of these revealed lymphoid cells similar to those seen on the needle aspirations raising the possibility  of lymphoma.  Numerous biopsies were obtained from the endobronchial lesion.  These were sent both for permanent pathology as well as for flow cytometry.  There was bleeding with the biopsies, but that was easily controlled with dilute epinephrine  applied topically.  A final inspection was made with the bronchoscope.  There was no ongoing bleeding.  The patient was extubated in the operating room and taken to the [facility] [facility] in good condition.  TN/NUANCE  D:12/23/2017 T:12/23/2017 JOB:[id number]

## 2017-12-23 NOTE — Discharge Instructions (Addendum)
Do not drive or engage in heavy physical activity for 24 hours  You may resume normal activities tomorrow  You may cough up small amounts of blood over the next few days  You may use acetaminophen (Tylenol) if needed for discomfort  Call [phone] if you develop chest pain, shortness of breath, fever > 101 F or cough up more than 2 tablespoons of blood  My office will call with a follow up appointment for later next week and also will arrange an appointment with Dr. Delton Coombes at the [place] [facility]

## 2017-12-23 NOTE — Anesthesia Preprocedure Evaluation (Signed)
Anesthesia Evaluation  Patient identified by MRN, date of birth, ID band Patient awake    Reviewed: Allergy & Precautions, NPO status , Patient's Chart, lab work & pertinent test results  Airway Mallampati: II  TM Distance: >3 FB Neck ROM: Full    Dental no notable dental hx. (+) Lower Dentures, Upper Dentures   Pulmonary shortness of breath and with exertion, COPD,  COPD inhaler, Current Smoker,  Right Hilar mass Mediastinal adenopathy    Pulmonary exam normal breath sounds clear to auscultation + decreased breath sounds      Cardiovascular hypertension, Pt. on medications + Peripheral Vascular Disease  Normal cardiovascular exam Rhythm:Regular Rate:Normal  AAA S/P endovascular stent S/P left fem pop bypass   Neuro/Psych PSYCHIATRIC DISORDERS Depression negative neurological ROS     GI/Hepatic hiatal hernia, GERD  Medicated and Controlled,  Endo/Other  Hypothyroidism Hyperlipidemia  Renal/GU   negative genitourinary   Musculoskeletal  (+) Arthritis , Osteoarthritis,    Abdominal   Peds  Hematology negative hematology ROS (+)   Anesthesia Other Findings   Reproductive/Obstetrics                             Anesthesia Physical Anesthesia Plan  ASA: III  Anesthesia Plan: General   Post-op Pain Management:    Induction: Intravenous  PONV Risk Score and Plan: 2 and Ondansetron, Dexamethasone and Treatment may vary due to age or medical condition  Airway Management Planned: Oral ETT  Additional Equipment: Arterial line  Intra-op Plan:   Post-operative Plan: Extubation in OR  Informed Consent: I have reviewed the patients History and Physical, chart, labs and discussed the procedure including the risks, benefits and alternatives for the proposed anesthesia with the patient or authorized representative who has indicated his/her understanding and acceptance.   Dental advisory  given  Plan Discussed with: CRNA and Surgeon  Anesthesia Plan Comments:         Anesthesia Quick Evaluation

## 2017-12-23 NOTE — Interval H&P Note (Signed)
History and Physical Interval Note:  12/23/2017 10:19 AM  Terry Boyd  has presented today for surgery, with the diagnosis of RIGHT HILAR MASS ADENOPATHY  The various methods of treatment have been discussed with the patient and family. After consideration of risks, benefits and other options for treatment, the patient has consented to  Procedure(s): VIDEO BRONCHOSCOPY WITH ENDOBRONCHIAL ULTRASOUND (N/A) possible MEDIASTINOSCOPY (N/A) as a surgical intervention .  The patient's history has been reviewed, patient examined, no change in status, stable for surgery.  I have reviewed the patient's chart and labs.  Questions were answered to the patient's satisfaction.     Melrose Nakayama

## 2017-12-24 ENCOUNTER — Encounter (HOSPITAL_COMMUNITY): Payer: Self-pay | Admitting: Thoracic Surgery (Cardiothoracic Vascular Surgery)

## 2017-12-29 ENCOUNTER — Ambulatory Visit (INDEPENDENT_AMBULATORY_CARE_PROVIDER_SITE_OTHER): Payer: Medicare Other | Admitting: Thoracic Surgery (Cardiothoracic Vascular Surgery)

## 2017-12-29 ENCOUNTER — Encounter: Payer: Self-pay | Admitting: Thoracic Surgery (Cardiothoracic Vascular Surgery)

## 2017-12-29 ENCOUNTER — Other Ambulatory Visit: Payer: Self-pay

## 2017-12-29 VITALS — BP 104/60 | HR 67 | Resp 16 | Ht 70.0 in | Wt 141.0 lb

## 2017-12-29 DIAGNOSIS — C3431 Malignant neoplasm of lower lobe, right bronchus or lung: Secondary | ICD-10-CM

## 2017-12-29 NOTE — Progress Notes (Signed)
      [address]       [place] [address]             [phone]      [name] returns to discuss the results of his bronchoscopy and endobronchial ultrasound.  He is a 77 year old smoker who presented with decreased appetite, early satiety, and weight loss.  Work-up showed narrowing of the right lower lobe bronchus and some mild right hilar adenopathy.  PET/CT showed hypermetabolic activity in her right infrahilar mass as well as hilar and mediastinal lymph nodes.  I did bronchoscopy and endobronchial ultrasound on 12/23/2017.  On intraoperative assessment there was a question that this might be lymphoma.  He did well with the procedure.  He did cough up a small amount of blood 3 days after the procedure.  He has not had any worsening of his respiratory status.  FINAL DIAGNOSIS Diagnosis Bronchus, biopsy, right lower lobe - SMALL CELL CARCINOMA - SEE COMMENT  I discussed with the patient and his family that the biopsy showed small cell carcinoma.  This is very limited stage disease at this point although he is not yet had an MRI of the brain.  He has an appointment with Dr. Delton Coombes tomorrow to discuss treatment.  I am happy to help out in any way that I can in the future.  Revonda Standard Roxan Hockey, MD [facility] [phone]

## 2017-12-30 ENCOUNTER — Inpatient Hospital Stay (HOSPITAL_COMMUNITY): Payer: Medicare Other | Attending: Hematology | Admitting: Hematology

## 2017-12-30 ENCOUNTER — Encounter (HOSPITAL_COMMUNITY): Payer: Self-pay | Admitting: Hematology

## 2017-12-30 ENCOUNTER — Encounter (HOSPITAL_COMMUNITY): Payer: Self-pay | Admitting: Lab

## 2017-12-30 VITALS — BP 108/65 | HR 58 | Resp 16 | Ht 68.5 in | Wt 140.5 lb

## 2017-12-30 DIAGNOSIS — Z808 Family history of malignant neoplasm of other organs or systems: Secondary | ICD-10-CM

## 2017-12-30 DIAGNOSIS — Z800 Family history of malignant neoplasm of digestive organs: Secondary | ICD-10-CM

## 2017-12-30 DIAGNOSIS — Z720 Tobacco use: Secondary | ICD-10-CM

## 2017-12-30 DIAGNOSIS — R634 Abnormal weight loss: Secondary | ICD-10-CM

## 2017-12-30 DIAGNOSIS — C801 Malignant (primary) neoplasm, unspecified: Secondary | ICD-10-CM

## 2017-12-30 DIAGNOSIS — Z5111 Encounter for antineoplastic chemotherapy: Secondary | ICD-10-CM | POA: Insufficient documentation

## 2017-12-30 DIAGNOSIS — C3490 Malignant neoplasm of unspecified part of unspecified bronchus or lung: Secondary | ICD-10-CM

## 2017-12-30 DIAGNOSIS — Z9181 History of falling: Secondary | ICD-10-CM

## 2017-12-30 DIAGNOSIS — C3431 Malignant neoplasm of lower lobe, right bronchus or lung: Secondary | ICD-10-CM | POA: Diagnosis not present

## 2017-12-30 NOTE — Progress Notes (Unsigned)
Referral sent to [facility].  Records faxed on 11/1

## 2017-12-30 NOTE — Patient Instructions (Signed)
[facility] Discharge Instructions     Thank you for choosing  Cancer Center at [facility] to provide your oncology and hematology care.  To afford each patient quality time with our provider, please arrive at least 15 minutes before your scheduled appointment time.   If you have a lab appointment with the Cancer Center please come in thru the  Main Entrance and check in at the main information desk  You need to re-schedule your appointment should you arrive 10 or more minutes late.  We strive to give you quality time with our providers, and arriving late affects you and other patients whose appointments are after yours.  Also, if you no show three or more times for appointments you may be dismissed from the clinic at the providers discretion.     Again, thank you for choosing [facility].  Our hope is that these requests will decrease the amount of time that you wait before being seen by our physicians.       _____________________________________________________________  Should you have questions after your visit to [facility], please contact our office at [phone] between the hours of 8:00 a.m. and 4:30 p.m.  Voicemails left after 4:00 p.m. will not be returned until the following business day.  For prescription refill requests, have your pharmacy contact our office and allow 72 hours.    Cancer Center Support Programs:   > Cancer Support Group  2nd Tuesday of the month 1pm-2pm, Journey Room    

## 2017-12-30 NOTE — Assessment & Plan Note (Addendum)
1.  Limited stage small cell lung cancer: - He had a recent fall, CT of the abdomen and pelvis on 11/08/2017 showed irregular narrowing of the right lower lobe bronchus with associated right hilar adenopathy. - Subsequent PET CT scan on 11/28/2017 shows lesion at the inferior lobar bronchus with SUV of 5.5 with the right hilar hypermetabolic and ipsilateral mediastinal adenopathy including right paratracheal, subcarinal and several small prevascular lymph nodes which demonstrate only low-grade metabolic activity. - On 22/58/3462 bronchoscopy and EBUS biopsy done by Dr. Roxan Hockey shows small cell carcinoma of the right bronchus biopsy.  Lymph node aspirations were also consistent with small cell lung cancer. - Patient is a current active smoker, smokes 1 pack/day for 60+ years.  He also had a brief exposure to asbestos.  He is a [place] [employer] veteran, was not sure whether he was exposed to agent orange. - He lives by himself and is independent of ADLs and IADLs.  He had 50 pound weight loss since January and a EGD done in [place] was consistent with gastroparesis. - We talked about the normal prognosis of small cell lung cancer.  For patients with limited stage disease, medial survival usually ranges between 15 to 20 months and reported 5-year survival is about 10 to 13%. - I have recommended treatment with combination chemoradiation therapy with 4 cycles of platinum and etoposide.  I do not believe he will be a candidate for cisplatin.  I will consider carboplatin and etoposide.  Upon completion of combination chemoradiation therapy, he will be offered prophylactic cranial irradiation if he achieves complete or very good partial response. -We have ordered MRI of the brain to complete the work-up.  I will also order a nutrition consult. - I have called and talked to Dr.Yanagihara who will see this patient next week.

## 2017-12-30 NOTE — Progress Notes (Signed)
[facility] NOTE  Patient Care Team: Rory Percy, MD as PCP - General (Unknown Physician Specialty)  CHIEF COMPLAINTS/PURPOSE OF CONSULTATION:  Newly diagnosed small cell lung cancer.  HISTORY OF PRESENTING ILLNESS:  Terry Boyd 77 y.o. male is seen in consultation today for further work-up and management of newly diagnosed small cell lung cancer.  He has a 1 pack/day smoking history for the past 60+ years.  He has reportedly lost about 50 pounds since January.  He was undergoing work-up for it including EGD which showed gastroparesis.  He eats about 1 meal per day and drinks 1 Ensure per day.  He recently sustained a couple of falls.  CT scan of the abdomen and pelvis on 11/08/2017 showed irregular narrowing of the right lower lobe bronchus with associated right hilar adenopathy.  Subsequent PET CT scan showed right hilar hypermetabolic and mediastinal adenopathy.  He underwent bronchoscopy and biopsy by Dr. Roxan Hockey on 12/23/2017.  This was consistent with small cell lung cancer.  He denies any change in his cough or hemoptysis.  He lives by himself at home and is independent of ADLs and IADLs.  He is accompanied by his daughter today.  He had brief exposure to asbestos.  He is a [place] [employer] veteran and does not know about exposure to agent orange.  Denies any tingling or numbness in the extremities.  Denies any headaches or vision changes.  No nausea or vomiting was reported. Family history is significant for one brother having throat cancer who was a smoker.  Another brother had pancreatic cancer and was a non-smoker.  MEDICAL HISTORY:  Past Medical History:  Diagnosis Date  . AAA (abdominal aortic aneurysm) ([place])   . Alternating constipation and diarrhea   . Arthritis   . Chronic back pain   . COPD (chronic obstructive pulmonary disease) ([place])   . Depression   . GERD (gastroesophageal reflux disease)   . History of hiatal hernia   . Hypertension    not  on medications  . Hypothyroidism   . Lung cancer ([place])   . Peripheral vascular disease ([place])   . Smoker   . Thyroid disease     SURGICAL HISTORY: Past Surgical History:  Procedure Laterality Date  . ABDOMINAL AORTAGRAM N/A 03/14/2012   Procedure: ABDOMINAL Maxcine Ham;  Surgeon: Serafina Mitchell, MD;  Location: [facility] CATH LAB;  Service: Cardiovascular;  Laterality: N/A;  . ABDOMINAL AORTIC ANEURYSM REPAIR  04-22-2009   Stent graft repair of AAA  . APPENDECTOMY    . BRONCHIAL BRUSHINGS Right 11/30/2017   Procedure: BRONCHIAL BRUSHINGS;  Surgeon: Sinda Du, MD;  [facility];  Service: Cardiopulmonary;  Laterality: Right;  . BRONCHIAL WASHINGS Right 11/30/2017   Procedure: BRONCHIAL WASHINGS;  Surgeon: Sinda Du, MD;  [facility];  Service: Cardiopulmonary;  Laterality: Right;  . BYPASS GRAFT POPLITEAL TO POPLITEAL  03/20/2012   Procedure: BYPASS GRAFT POPLITEAL TO POPLITEAL;  Surgeon: Elam Dutch, MD;  Location: [facility];  Service: Vascular;  Laterality: Left;  ABOVE THE KNEE POPLITEAL ARTERY TO BELOW THE KNEE POPLITEAL ARTERY BYPASS GRAFT USING REVERSE LEFT GREATER SAPHENOUS VEIN   . ENDARTERECTOMY POPLITEAL  03/20/2012   Procedure: ENDARTERECTOMY POPLITEAL;  Surgeon: Elam Dutch, MD;  Location: [facility] OR;  Service: Vascular;  Laterality: Left;   LIGATION OF LEFT LEG POPLITEAL ANEURYSM  . FLEXIBLE BRONCHOSCOPY N/A 11/30/2017   Procedure: FLEXIBLE BRONCHOSCOPY;  Surgeon: Sinda Du, MD;  [facility];  Service:  Cardiopulmonary;  Laterality: N/A;  . INTRAOPERATIVE ARTERIOGRAM  03/20/2012   Procedure: INTRA OPERATIVE ARTERIOGRAM;  Surgeon: Elam Dutch, MD;  Location: [facility];  Service: Vascular;  Laterality: Left;  [place]  . VIDEO BRONCHOSCOPY WITH ENDOBRONCHIAL ULTRASOUND N/A 12/23/2017   Procedure: VIDEO BRONCHOSCOPY WITH ENDOBRONCHIAL ULTRASOUND;  Surgeon: Melrose Nakayama, MD;  Location: [facility];  Service: Thoracic;   Laterality: N/A;    SOCIAL HISTORY: Social History   Socioeconomic History  . Marital status: Divorced    Spouse name: Not on file  . Number of children: 2  . Years of education: Not on file  . Highest education level: Not on file  Occupational History  . Occupation: [place]     Comment: Agent Orange exposure  . Occupation: Sales executive    Comment: [place] exsposure  Social Needs  . Financial resource strain: Not hard at all  . Food insecurity:    Worry: Never true    Inability: Never true  . Transportation needs:    Medical: No    Non-medical: No  Tobacco Use  . Smoking status: Current Every Day Smoker    Years: 62.00    Types: Cigars  . Smokeless tobacco: Never Used  . Tobacco comment: pt states he smokes about 15 cigars per day  Substance and Sexual Activity  . Alcohol use: No  . Drug use: No  . Sexual activity: Yes    Birth control/protection: None  Lifestyle  . Physical activity:    Days per week: 0 days    Minutes per session: 0 min  . Stress: To some extent  Relationships  . Social connections:    Talks on phone: More than three times a week    Gets together: Once a week    Attends religious service: More than 4 times per year    Active member of club or organization: No    Attends meetings of clubs or organizations: Never    Relationship status: Divorced  . Intimate partner violence:    Fear of current or ex partner: No    Emotionally abused: No    Physically abused: No    Forced sexual activity: No  Other Topics Concern  . Not on file  Social History Narrative   Sedentary    FAMILY HISTORY: Family History  Problem Relation Age of Onset  . Diabetes Mother   . Stroke Mother   . Heart disease Father   . Diabetes Father   . Heart disease Sister        CABG x 4  . Cancer Brother        throat cancer  . Diabetes Sister   . Heart disease Sister   . Cancer Brother   . Kidney disease Son     ALLERGIES:  is allergic to morphine and  related.  MEDICATIONS:  Current Outpatient Medications  Medication Sig Dispense Refill  . Ipratropium-Albuterol (COMBIVENT RESPIMAT) 20-100 MCG/ACT AERS respimat Inhale 1 puff into the lungs every 6 (six) hours as needed for wheezing.    Marland Kitchen levothyroxine (SYNTHROID, LEVOTHROID) 137 MCG tablet Take 137 mcg by mouth daily before breakfast.     . LORazepam (ATIVAN) 1 MG tablet Take 1 mg by mouth at bedtime.     . metoCLOPramide (REGLAN) 5 MG tablet Take 5 mg by mouth daily before lunch.    Marland Kitchen omeprazole (PRILOSEC) 20 MG capsule Take 20 mg by mouth daily.    Marland Kitchen POTASSIUM PO  Take 1 tablet by mouth daily.    . sertraline (ZOLOFT) 50 MG tablet Take 50 mg by mouth every morning.     . simvastatin (ZOCOR) 10 MG tablet Take 10 mg by mouth at bedtime.     No current facility-administered medications for this visit.     REVIEW OF SYSTEMS:   Constitutional: Denies fevers, chills or abnormal night sweats Eyes: Denies blurriness of vision, double vision or watery eyes Ears, nose, mouth, throat, and face: Denies mucositis or sore throat Respiratory: Positive for shortness of breath on exertion. Cardiovascular: Denies palpitation, chest discomfort or lower extremity swelling Gastrointestinal: Positive for constipation.  Denies any nausea or vomiting.   Skin: Denies abnormal skin rashes Lymphatics: Denies new lymphadenopathy or easy bruising Neurological:Denies numbness, tingling or new weaknesses Behavioral/Psych: Mood is stable, no new changes  All other systems were reviewed with the patient and are negative.  PHYSICAL EXAMINATION: ECOG PERFORMANCE STATUS: 1 - Symptomatic but completely ambulatory  Vitals:   12/30/17 1336  BP: 108/65  Pulse: (!) 58  Resp: 16  SpO2: 100%   Filed Weights   12/30/17 1336  Weight: 140 lb 8 oz (63.7 kg)    GENERAL:alert, no distress and comfortable SKIN: skin color, texture, turgor are normal, no rashes or significant lesions EYES: normal, conjunctiva are  pink and non-injected, sclera clear OROPHARYNX:no exudate, no erythema and lips, buccal mucosa, and tongue normal  NECK: supple, thyroid normal size, non-tender, without nodularity LYMPH:  no palpable lymphadenopathy in the cervical, axillary or inguinal LUNGS: clear to auscultation and percussion with normal breathing effort HEART: regular rate & rhythm and no murmurs and no lower extremity edema ABDOMEN:abdomen soft, non-tender and normal bowel sounds Musculoskeletal:no cyanosis of digits and no clubbing  PSYCH: alert & oriented x 3 with fluent speech NEURO: no focal motor/sensory deficits  LABORATORY DATA:  I have reviewed the data as listed Lab Results  Component Value Date   WBC 11.0 (H) 12/23/2017   HGB 14.9 12/23/2017   HCT 46.6 12/23/2017   MCV 96.9 12/23/2017   PLT 235 12/23/2017     Chemistry      Component Value Date/Time   NA 138 12/23/2017 0755   K 4.1 12/23/2017 0755   CL 106 12/23/2017 0755   CO2 24 12/23/2017 0755   BUN 14 12/23/2017 0755   CREATININE 1.04 12/23/2017 0755      Component Value Date/Time   CALCIUM 9.0 12/23/2017 0755   ALKPHOS 61 12/23/2017 0755   AST 20 12/23/2017 0755   ALT 15 12/23/2017 0755   BILITOT 0.7 12/23/2017 0755       RADIOGRAPHIC STUDIES: I have personally reviewed the radiological images as listed and agreed with the findings in the report. Dg Chest 2 View  Result Date: 12/23/2017 CLINICAL DATA:  Preoperative evaluation for bronchoscopy, history COPD, hypertension, atherosclerotic disease, abdominal aortic aneurysm, GERD EXAM: CHEST - 2 VIEW COMPARISON:  11/08/2017 FINDINGS: Normal heart size, mediastinal contours, and pulmonary vascularity. Minimal atherosclerotic calcification aorta. Emphysematous and bronchitic changes consistent with COPD. No acute infiltrate, pleural effusion, or pneumothorax. RIGHT hilar changes identified on recent CT a chest exam are radiographically inapparent. Bones demineralized. Radiopacities  within stool in the splenic flexure region of the colon. IMPRESSION: COPD changes. No acute abnormalities. Electronically Signed   By: Lavonia Dana M.D.   On: 12/23/2017 11:06    ASSESSMENT & PLAN:  Small cell lung cancer ([place]) 1.  Limited stage small cell lung cancer: - He had a recent  fall, CT of the abdomen and pelvis on 11/08/2017 showed irregular narrowing of the right lower lobe bronchus with associated right hilar adenopathy. - Subsequent PET CT scan on 11/28/2017 shows lesion at the inferior lobar bronchus with SUV of 5.5 with the right hilar hypermetabolic and ipsilateral mediastinal adenopathy including right paratracheal, subcarinal and several small prevascular lymph nodes which demonstrate only low-grade metabolic activity. - On 82/99/3716 bronchoscopy and EVIS biopsy done by Dr. Roxan Hockey shows small cell carcinoma of the right bronchus biopsy.  Lymph node aspirations were also consistent with small cell lung cancer. - Patient is a current active smoker, smokes 1 pack/day for 60+ years.  He also had a brief exposure to asbestos.  He is a [place] [employer] veteran, was not sure whether he was exposed to agent orange. - He lives by himself and is independent of ADLs and IADLs.  He had 50 pound weight loss since January and a EGD done in [place] was consistent with gastroparesis. - We talked about the normal prognosis of small cell lung cancer.  For patients with limited stage disease, medial survival usually ranges between 15 to 20 months and reported 5-year survival is about 10 to 13%. - I have recommended treatment with combination chemoradiation therapy with 4 cycles of platinum and etoposide.  I do not believe he will be a candidate for cisplatin.  I will consider carboplatin and etoposide.  Upon completion of combination chemoradiation therapy, he will be offered prophylactic cranial irradiation if he achieves complete or very good partial response. -We have ordered MRI of the brain to complete  the work-up.  I will also order a nutrition consult. - I have called and talked to Dr.Yanagihara who will see this patient next week.  Orders Placed This Encounter  Procedures  . MR Brain W Wo Contrast    Standing Status:   Future    Standing Expiration Date:   12/30/2018    Order Specific Question:   ** REASON FOR EXAM (FREE TEXT)    Answer:   small cell lung cancer    Order Specific Question:   If indicated for the ordered procedure, I authorize the administration of contrast media per Radiology protocol    Answer:   Yes    Order Specific Question:   What is the patient's sedation requirement?    Answer:   No Sedation    Order Specific Question:   Does the patient have a pacemaker or implanted devices?    Answer:   No    Order Specific Question:   Use SRS Protocol?    Answer:   Yes    Order Specific Question:   Radiology Contrast Protocol - do NOT remove file path    Answer:   [email or web address]    Order Specific Question:   Preferred imaging location?    Answer:   [facility] (table limit-350lbs)    All questions were answered. The patient knows to call the clinic with any problems, questions or concerns.      Derek Jack, MD 12/30/2017 5:05 PM

## 2018-01-02 DIAGNOSIS — C3490 Malignant neoplasm of unspecified part of unspecified bronchus or lung: Secondary | ICD-10-CM | POA: Diagnosis not present

## 2018-01-03 ENCOUNTER — Ambulatory Visit (INDEPENDENT_AMBULATORY_CARE_PROVIDER_SITE_OTHER): Payer: Medicare Other | Admitting: General Surgery

## 2018-01-03 ENCOUNTER — Encounter: Payer: Self-pay | Admitting: General Surgery

## 2018-01-03 VITALS — BP 99/58 | HR 67 | Temp 98.4°F | Resp 20 | Wt 139.0 lb

## 2018-01-03 DIAGNOSIS — C3490 Malignant neoplasm of unspecified part of unspecified bronchus or lung: Secondary | ICD-10-CM

## 2018-01-03 NOTE — Patient Instructions (Signed)
Implanted Port Insertion  Implanted port insertion is a procedure to put in a port and catheter. The port is a device with an injectable disk that can be accessed by your health care provider. The port is connected to a vein in the chest or neck by a small flexible tube (catheter). There are different types of ports. The implanted port may be used as a long-term IV access for:  · Medicines, such as chemotherapy.  · Fluids.  · Liquid nutrition, such as total parenteral nutrition (TPN).  · Blood samples.    Having a port means that your health care provider will not need to use the veins in your arms for these procedures.  Tell a health care provider about:  · Any allergies you have.  · All medicines you are taking, especially blood thinners, as well as any vitamins, herbs, eye drops, creams, over-the-counter medicines, and steroids.  · Any problems you or family members have had with anesthetic medicines.  · Any blood disorders you have.  · Any surgeries you have had.  · Any medical conditions you have, including diabetes or kidney problems.  · Whether you are pregnant or may be pregnant.  What are the risks?  Generally, this is a safe procedure. However, problems may occur, including:  · Allergic reactions to medicines or dyes.  · Damage to other structures or organs.  · Infection.  · Damage to the blood vessel, bruising, or bleeding at the puncture site.  · Blood clot.  · Breakdown of the skin over the port.  · A collection of air in the chest that can cause one of the lungs to collapse (pneumothorax). This is rare.    What happens before the procedure?  Staying hydrated  Follow instructions from your health care provider about hydration, which may include:  · Up to 2 hours before the procedure - you may continue to drink clear liquids, such as water, clear fruit juice, black coffee, and plain tea.    Eating and drinking restrictions  · Follow instructions from your health care provider about eating and drinking,  which may include:  ? 8 hours before the procedure - stop eating heavy meals or foods such as meat, fried foods, or fatty foods.  ? 6 hours before the procedure - stop eating light meals or foods, such as toast or cereal.  ? 6 hours before the procedure - stop drinking milk or drinks that contain milk.  ? 2 hours before the procedure - stop drinking clear liquids.  Medicines  · Ask your health care provider about:  ? Changing or stopping your regular medicines. This is especially important if you are taking diabetes medicines or blood thinners.  ? Taking medicines such as aspirin and ibuprofen. These medicines can thin your blood. Do not take these medicines before your procedure if your health care provider instructs you not to.  · You may be given antibiotic medicine to help prevent infection.  General instructions  · Plan to have someone take you home from the hospital or clinic.  · If you will be going home right after the procedure, plan to have someone with you for 24 hours.  · You may have blood tests.  · You may be asked to shower with a germ-killing soap.  What happens during the procedure?  · To lower your risk of infection:  ? Your health care team will wash or sanitize their hands.  ? Your skin will be washed with   soap.  ? Hair may be removed from the surgical area.  · An IV tube will be inserted into one of your veins.  · You will be given one or more of the following:  ? A medicine to help you relax (sedative).  ? A medicine to numb the area (local anesthetic).  · Two small cuts (incisions) will be made to insert the port.  ? One incision will be made in your neck to get access to the vein where the catheter will lie.  ? The other incision will be made in the upper chest. This is where the port will lie.  · The procedure may be done using continuous X-ray (fluoroscopy) or other imaging tools for guidance.  · The port and catheter will be placed. There may be a small, raised area where the port  is.  · The port will be flushed with a salt solution (saline), and blood will be drawn to make sure that it is working correctly.  · The incisions will be closed.  · Bandages (dressings) may be placed over the incisions.  The procedure may vary among health care providers and hospitals.  What happens after the procedure?  · Your blood pressure, heart rate, breathing rate, and blood oxygen level will be monitored until the medicines you were given have worn off.  · Do not drive for 24 hours if you were given a sedative.  · You will be given a manufacturer's information card for the type of port that you have. Keep this with you.  · Your port will need to be flushed and checked as told by your health care provider, usually every few weeks.  · A chest X-ray will be done to:  ? Check the placement of the port.  ? Make sure there is no injury to your lung.  Summary  · Implanted port insertion is a procedure to put in a port and catheter.  · The implanted port is used as a long-term IV access.  · The port will need to be flushed and checked as told by your health care provider, usually every few weeks.  · Keep your manufacturer's information card with you at all times.  This information is not intended to replace advice given to you by your health care provider. Make sure you discuss any questions you have with your health care provider.  Document Released: 12/06/2012 Document Revised: 01/07/2016 Document Reviewed: 01/07/2016  Elsevier Interactive Patient Education © 2017 [employer]

## 2018-01-03 NOTE — Progress Notes (Signed)
Terry Boyd; [id number]; Jun 13, 1940   HPI Patient is a 77 year old white male who was referred to my care by Dr. Delton Coombes and Dr. Nadara Mustard for Port-A-Cath placement.  He is about to undergo chemotherapy for right lung carcinoma.  He currently has no pain. Past Medical History:  Diagnosis Date  . AAA (abdominal aortic aneurysm) ([place])   . Alternating constipation and diarrhea   . Arthritis   . Chronic back pain   . COPD (chronic obstructive pulmonary disease) ([place])   . Depression   . GERD (gastroesophageal reflux disease)   . History of hiatal hernia   . Hypertension    not on medications  . Hypothyroidism   . Lung cancer ([place])   . Peripheral vascular disease ([place])   . Smoker   . Thyroid disease     Past Surgical History:  Procedure Laterality Date  . ABDOMINAL AORTAGRAM N/A 03/14/2012   Procedure: ABDOMINAL Maxcine Ham;  Surgeon: Serafina Mitchell, MD;  Location: [facility] CATH LAB;  Service: Cardiovascular;  Laterality: N/A;  . ABDOMINAL AORTIC ANEURYSM REPAIR  04-22-2009   Stent graft repair of AAA  . APPENDECTOMY    . BRONCHIAL BRUSHINGS Right 11/30/2017   Procedure: BRONCHIAL BRUSHINGS;  Surgeon: Sinda Du, MD;  [facility];  Service: Cardiopulmonary;  Laterality: Right;  . BRONCHIAL WASHINGS Right 11/30/2017   Procedure: BRONCHIAL WASHINGS;  Surgeon: Sinda Du, MD;  [facility];  Service: Cardiopulmonary;  Laterality: Right;  . BYPASS GRAFT POPLITEAL TO POPLITEAL  03/20/2012   Procedure: BYPASS GRAFT POPLITEAL TO POPLITEAL;  Surgeon: Elam Dutch, MD;  Location: [place];  Service: Vascular;  Laterality: Left;  ABOVE THE KNEE POPLITEAL ARTERY TO BELOW THE KNEE POPLITEAL ARTERY BYPASS GRAFT USING REVERSE LEFT GREATER SAPHENOUS VEIN   . ENDARTERECTOMY POPLITEAL  03/20/2012   Procedure: ENDARTERECTOMY POPLITEAL;  Surgeon: Elam Dutch, MD;  Location: [facility] OR;  Service: Vascular;  Laterality: Left;   LIGATION OF LEFT LEG POPLITEAL ANEURYSM  . FLEXIBLE  BRONCHOSCOPY N/A 11/30/2017   Procedure: FLEXIBLE BRONCHOSCOPY;  Surgeon: Sinda Du, MD;  [facility];  Service: Cardiopulmonary;  Laterality: N/A;  . INTRAOPERATIVE ARTERIOGRAM  03/20/2012   Procedure: INTRA OPERATIVE ARTERIOGRAM;  Surgeon: Elam Dutch, MD;  Location: [place];  Service: Vascular;  Laterality: Left;  [place]  . VIDEO BRONCHOSCOPY WITH ENDOBRONCHIAL ULTRASOUND N/A 12/23/2017   Procedure: VIDEO BRONCHOSCOPY WITH ENDOBRONCHIAL ULTRASOUND;  Surgeon: Melrose Nakayama, MD;  Location: [facility] OR;  Service: Thoracic;  Laterality: N/A;    Family History  Problem Relation Age of Onset  . Diabetes Mother   . Stroke Mother   . Heart disease Father   . Diabetes Father   . Heart disease Sister        CABG x 4  . Cancer Brother        throat cancer  . Diabetes Sister   . Heart disease Sister   . Cancer Brother   . Kidney disease Son     Current Outpatient Medications on File Prior to Visit  Medication Sig Dispense Refill  . Ipratropium-Albuterol (COMBIVENT RESPIMAT) 20-100 MCG/ACT AERS respimat Inhale 1 puff into the lungs every 6 (six) hours as needed for wheezing.    Marland Kitchen levothyroxine (SYNTHROID, LEVOTHROID) 137 MCG tablet Take 137 mcg by mouth daily before breakfast.     . LORazepam (ATIVAN) 1 MG tablet Take 1 mg by mouth at bedtime.     . metoCLOPramide (REGLAN)  5 MG tablet Take 5 mg by mouth daily before lunch.    Marland Kitchen omeprazole (PRILOSEC) 20 MG capsule Take 20 mg by mouth daily.    Marland Kitchen POTASSIUM PO Take 1 tablet by mouth daily.    . sertraline (ZOLOFT) 50 MG tablet Take 50 mg by mouth every morning.     . simvastatin (ZOCOR) 10 MG tablet Take 10 mg by mouth at bedtime.     No current facility-administered medications on file prior to visit.     Allergies  Allergen Reactions  . Morphine And Related Shortness Of Breath and Other (See Comments)    Hallucinations pt has taken hydromorphone before    Social History   Substance and  Sexual Activity  Alcohol Use No    Social History   Tobacco Use  Smoking Status Current Every Day Smoker  . Years: 62.00  . Types: Cigars  Smokeless Tobacco Never Used  Tobacco Comment   pt states he smokes about 15 cigars per day    Review of Systems  HENT: Negative.   Eyes: Negative.   Respiratory: Positive for cough, shortness of breath and wheezing.   Cardiovascular: Negative.   Gastrointestinal: Positive for abdominal pain and heartburn.  Genitourinary: Negative.   Musculoskeletal: Negative.   Skin: Negative.   Neurological: Negative.   Endo/Heme/Allergies: Negative.   Psychiatric/Behavioral: Negative.     Objective   Vitals:   01/03/18 1505  BP: (!) 99/58  Pulse: 67  Resp: 20  Temp: 98.4 F (36.9 C)    Physical Exam  Constitutional: He is oriented to person, place, and time. He appears well-developed and well-nourished. No distress.  HENT:  Head: Normocephalic and atraumatic.  Cardiovascular: Normal rate, regular rhythm and normal heart sounds. Exam reveals no gallop and no friction rub.  No murmur heard. Pulmonary/Chest: Effort normal and breath sounds normal. No stridor. No respiratory distress. He has no wheezes. He has no rales.  Neurological: He is alert and oriented to person, place, and time.  Skin: Skin is warm and dry.  Vitals reviewed. Oncology notes reviewed  Assessment  Small cell carcinoma of right lung Plan   Patient is scheduled for Port-A-Cath insertion on 01/06/2018.  The risks and benefits of the procedure including and pneumothorax were fully explained to the patient, who gave informed consent.

## 2018-01-03 NOTE — H&P (Signed)
Terry Boyd; [id number]; Jul 06, 1940   HPI Patient is a 77 year old white male who was referred to my care by Dr. Delton Coombes and Dr. Nadara Mustard for Port-A-Cath placement.  He is about to undergo chemotherapy for right lung carcinoma.  He currently has no pain. Past Medical History:  Diagnosis Date  . AAA (abdominal aortic aneurysm) ([place])   . Alternating constipation and diarrhea   . Arthritis   . Chronic back pain   . COPD (chronic obstructive pulmonary disease) ([place])   . Depression   . GERD (gastroesophageal reflux disease)   . History of hiatal hernia   . Hypertension    not on medications  . Hypothyroidism   . Lung cancer ([place])   . Peripheral vascular disease ([place])   . Smoker   . Thyroid disease     Past Surgical History:  Procedure Laterality Date  . ABDOMINAL AORTAGRAM N/A 03/14/2012   Procedure: ABDOMINAL Maxcine Ham;  Surgeon: Serafina Mitchell, MD;  Location: [facility] CATH LAB;  Service: Cardiovascular;  Laterality: N/A;  . ABDOMINAL AORTIC ANEURYSM REPAIR  04-22-2009   Stent graft repair of AAA  . APPENDECTOMY    . BRONCHIAL BRUSHINGS Right 11/30/2017   Procedure: BRONCHIAL BRUSHINGS;  Surgeon: Sinda Du, MD;  [facility];  Service: Cardiopulmonary;  Laterality: Right;  . BRONCHIAL WASHINGS Right 11/30/2017   Procedure: BRONCHIAL WASHINGS;  Surgeon: Sinda Du, MD;  [facility];  Service: Cardiopulmonary;  Laterality: Right;  . BYPASS GRAFT POPLITEAL TO POPLITEAL  03/20/2012   Procedure: BYPASS GRAFT POPLITEAL TO POPLITEAL;  Surgeon: Elam Dutch, MD;  Location: [place];  Service: Vascular;  Laterality: Left;  ABOVE THE KNEE POPLITEAL ARTERY TO BELOW THE KNEE POPLITEAL ARTERY BYPASS GRAFT USING REVERSE LEFT GREATER SAPHENOUS VEIN   . ENDARTERECTOMY POPLITEAL  03/20/2012   Procedure: ENDARTERECTOMY POPLITEAL;  Surgeon: Elam Dutch, MD;  Location: [facility] OR;  Service: Vascular;  Laterality: Left;   LIGATION OF LEFT LEG POPLITEAL ANEURYSM  . FLEXIBLE  BRONCHOSCOPY N/A 11/30/2017   Procedure: FLEXIBLE BRONCHOSCOPY;  Surgeon: Sinda Du, MD;  [facility];  Service: Cardiopulmonary;  Laterality: N/A;  . INTRAOPERATIVE ARTERIOGRAM  03/20/2012   Procedure: INTRA OPERATIVE ARTERIOGRAM;  Surgeon: Elam Dutch, MD;  Location: [place];  Service: Vascular;  Laterality: Left;  [place]  . VIDEO BRONCHOSCOPY WITH ENDOBRONCHIAL ULTRASOUND N/A 12/23/2017   Procedure: VIDEO BRONCHOSCOPY WITH ENDOBRONCHIAL ULTRASOUND;  Surgeon: Melrose Nakayama, MD;  Location: [facility] OR;  Service: Thoracic;  Laterality: N/A;    Family History  Problem Relation Age of Onset  . Diabetes Mother   . Stroke Mother   . Heart disease Father   . Diabetes Father   . Heart disease Sister        CABG x 4  . Cancer Brother        throat cancer  . Diabetes Sister   . Heart disease Sister   . Cancer Brother   . Kidney disease Son     Current Outpatient Medications on File Prior to Visit  Medication Sig Dispense Refill  . Ipratropium-Albuterol (COMBIVENT RESPIMAT) 20-100 MCG/ACT AERS respimat Inhale 1 puff into the lungs every 6 (six) hours as needed for wheezing.    Marland Kitchen levothyroxine (SYNTHROID, LEVOTHROID) 137 MCG tablet Take 137 mcg by mouth daily before breakfast.     . LORazepam (ATIVAN) 1 MG tablet Take 1 mg by mouth at bedtime.     . metoCLOPramide (REGLAN)  5 MG tablet Take 5 mg by mouth daily before lunch.    Marland Kitchen omeprazole (PRILOSEC) 20 MG capsule Take 20 mg by mouth daily.    Marland Kitchen POTASSIUM PO Take 1 tablet by mouth daily.    . sertraline (ZOLOFT) 50 MG tablet Take 50 mg by mouth every morning.     . simvastatin (ZOCOR) 10 MG tablet Take 10 mg by mouth at bedtime.     No current facility-administered medications on file prior to visit.     Allergies  Allergen Reactions  . Morphine And Related Shortness Of Breath and Other (See Comments)    Hallucinations pt has taken hydromorphone before    Social History   Substance and  Sexual Activity  Alcohol Use No    Social History   Tobacco Use  Smoking Status Current Every Day Smoker  . Years: 62.00  . Types: Cigars  Smokeless Tobacco Never Used  Tobacco Comment   pt states he smokes about 15 cigars per day    Review of Systems  HENT: Negative.   Eyes: Negative.   Respiratory: Positive for cough, shortness of breath and wheezing.   Cardiovascular: Negative.   Gastrointestinal: Positive for abdominal pain and heartburn.  Genitourinary: Negative.   Musculoskeletal: Negative.   Skin: Negative.   Neurological: Negative.   Endo/Heme/Allergies: Negative.   Psychiatric/Behavioral: Negative.     Objective   Vitals:   01/03/18 1505  BP: (!) 99/58  Pulse: 67  Resp: 20  Temp: 98.4 F (36.9 C)    Physical Exam  Constitutional: He is oriented to person, place, and time. He appears well-developed and well-nourished. No distress.  HENT:  Head: Normocephalic and atraumatic.  Cardiovascular: Normal rate, regular rhythm and normal heart sounds. Exam reveals no gallop and no friction rub.  No murmur heard. Pulmonary/Chest: Effort normal and breath sounds normal. No stridor. No respiratory distress. He has no wheezes. He has no rales.  Neurological: He is alert and oriented to person, place, and time.  Skin: Skin is warm and dry.  Vitals reviewed. Oncology notes reviewed  Assessment  Small cell carcinoma of right lung Plan   Patient is scheduled for Port-A-Cath insertion on 01/06/2018.  The risks and benefits of the procedure including and pneumothorax were fully explained to the patient, who gave informed consent.

## 2018-01-04 ENCOUNTER — Encounter (HOSPITAL_COMMUNITY): Payer: Self-pay

## 2018-01-04 ENCOUNTER — Encounter (HOSPITAL_COMMUNITY)
Admission: RE | Admit: 2018-01-04 | Discharge: 2018-01-04 | Disposition: A | Discharge status: Home / Self Care | Payer: Medicare Other | Source: Ambulatory Visit | Attending: General Surgery | Admitting: General Surgery

## 2018-01-05 ENCOUNTER — Ambulatory Visit (HOSPITAL_COMMUNITY): Payer: Medicare Other

## 2018-01-05 HISTORY — PX: ESOPHAGOGASTRODUODENOSCOPY: SHX1529

## 2018-01-06 ENCOUNTER — Ambulatory Visit (HOSPITAL_COMMUNITY): Payer: Medicare Other | Admitting: Anesthesiology

## 2018-01-06 ENCOUNTER — Encounter (HOSPITAL_COMMUNITY): Payer: Medicare Other

## 2018-01-06 ENCOUNTER — Ambulatory Visit (HOSPITAL_COMMUNITY): Payer: Medicare Other

## 2018-01-06 ENCOUNTER — Encounter (HOSPITAL_COMMUNITY): Payer: Self-pay | Admitting: *Deleted

## 2018-01-06 ENCOUNTER — Encounter (HOSPITAL_COMMUNITY)
Admission: RE | Disposition: A | Discharge status: Home / Self Care | Payer: Self-pay | Source: Ambulatory Visit | Attending: General Surgery

## 2018-01-06 ENCOUNTER — Ambulatory Visit (HOSPITAL_COMMUNITY)
Admission: RE | Admit: 2018-01-06 | Discharge: 2018-01-06 | Disposition: A | Discharge status: Home / Self Care | Payer: Medicare Other | Source: Ambulatory Visit | Attending: General Surgery | Admitting: General Surgery

## 2018-01-06 ENCOUNTER — Ambulatory Visit (HOSPITAL_COMMUNITY)
Admission: RE | Admit: 2018-01-06 | Discharge: 2018-01-06 | Disposition: A | Discharge status: Home / Self Care | Payer: Medicare Other | Source: Ambulatory Visit | Attending: Nurse Practitioner | Admitting: Nurse Practitioner

## 2018-01-06 DIAGNOSIS — C3491 Malignant neoplasm of unspecified part of right bronchus or lung: Secondary | ICD-10-CM | POA: Insufficient documentation

## 2018-01-06 DIAGNOSIS — I10 Essential (primary) hypertension: Secondary | ICD-10-CM | POA: Diagnosis not present

## 2018-01-06 DIAGNOSIS — C3490 Malignant neoplasm of unspecified part of unspecified bronchus or lung: Secondary | ICD-10-CM

## 2018-01-06 DIAGNOSIS — Z885 Allergy status to narcotic agent status: Secondary | ICD-10-CM | POA: Diagnosis not present

## 2018-01-06 DIAGNOSIS — E039 Hypothyroidism, unspecified: Secondary | ICD-10-CM | POA: Insufficient documentation

## 2018-01-06 DIAGNOSIS — F329 Major depressive disorder, single episode, unspecified: Secondary | ICD-10-CM | POA: Diagnosis not present

## 2018-01-06 DIAGNOSIS — J449 Chronic obstructive pulmonary disease, unspecified: Secondary | ICD-10-CM | POA: Insufficient documentation

## 2018-01-06 DIAGNOSIS — M1990 Unspecified osteoarthritis, unspecified site: Secondary | ICD-10-CM | POA: Insufficient documentation

## 2018-01-06 DIAGNOSIS — K219 Gastro-esophageal reflux disease without esophagitis: Secondary | ICD-10-CM | POA: Diagnosis not present

## 2018-01-06 DIAGNOSIS — Z452 Encounter for adjustment and management of vascular access device: Secondary | ICD-10-CM | POA: Diagnosis not present

## 2018-01-06 DIAGNOSIS — I739 Peripheral vascular disease, unspecified: Secondary | ICD-10-CM | POA: Insufficient documentation

## 2018-01-06 DIAGNOSIS — F17290 Nicotine dependence, other tobacco product, uncomplicated: Secondary | ICD-10-CM | POA: Insufficient documentation

## 2018-01-06 DIAGNOSIS — Z95828 Presence of other vascular implants and grafts: Secondary | ICD-10-CM

## 2018-01-06 DIAGNOSIS — Z79899 Other long term (current) drug therapy: Secondary | ICD-10-CM | POA: Insufficient documentation

## 2018-01-06 DIAGNOSIS — C801 Malignant (primary) neoplasm, unspecified: Secondary | ICD-10-CM

## 2018-01-06 HISTORY — PX: PORTACATH PLACEMENT: SHX2246

## 2018-01-06 SURGERY — INSERTION, TUNNELED CENTRAL VENOUS DEVICE, WITH PORT
Anesthesia: Monitor Anesthesia Care | Site: Chest | Laterality: Left

## 2018-01-06 MED ORDER — CHLORHEXIDINE GLUCONATE CLOTH 2 % EX PADS: 6.0000 | MEDICATED_PAD | Freq: Once | CUTANEOUS | Status: DC

## 2018-01-06 MED ORDER — MEPERIDINE HCL 50 MG/ML IJ SOLN: 6.2500 mg | INTRAMUSCULAR | Status: DC | PRN

## 2018-01-06 MED ORDER — KETOROLAC TROMETHAMINE 30 MG/ML IJ SOLN
15.0000 mg | Freq: Once | INTRAMUSCULAR | Status: AC
  Administered 2018-01-06: 15 mg via INTRAVENOUS
  Filled 2018-01-06: qty 1

## 2018-01-06 MED ORDER — HYDROCODONE-ACETAMINOPHEN 7.5-325 MG PO TABS: 1.0000 | ORAL_TABLET | Freq: Once | ORAL | Status: DC | PRN

## 2018-01-06 MED ORDER — FENTANYL CITRATE (PF) 100 MCG/2ML IJ SOLN
INTRAMUSCULAR | Status: DC | PRN
  Administered 2018-01-06: 25 ug via INTRAVENOUS

## 2018-01-06 MED ORDER — LACTATED RINGERS IV SOLN: INTRAVENOUS | Status: DC | PRN

## 2018-01-06 MED ORDER — PROPOFOL 500 MG/50ML IV EMUL
INTRAVENOUS | Status: DC | PRN
  Administered 2018-01-06: 35 ug/kg/min via INTRAVENOUS

## 2018-01-06 MED ORDER — LACTATED RINGERS IV SOLN
INTRAVENOUS | Status: DC
  Administered 2018-01-06: 1000 mL via INTRAVENOUS

## 2018-01-06 MED ORDER — SODIUM CHLORIDE (PF) 0.9 % IJ SOLN
INTRAMUSCULAR | Status: DC | PRN
  Administered 2018-01-06: 2 mL

## 2018-01-06 MED ORDER — MIDAZOLAM HCL 5 MG/5ML IJ SOLN
INTRAMUSCULAR | Status: DC | PRN
  Administered 2018-01-06: 2 mg via INTRAVENOUS

## 2018-01-06 MED ORDER — FENTANYL CITRATE (PF) 100 MCG/2ML IJ SOLN
INTRAMUSCULAR | Status: AC
  Filled 2018-01-06: qty 2

## 2018-01-06 MED ORDER — KETOROLAC TROMETHAMINE 30 MG/ML IJ SOLN
INTRAMUSCULAR | Status: AC
  Filled 2018-01-06: qty 1

## 2018-01-06 MED ORDER — KETOROLAC TROMETHAMINE 30 MG/ML IJ SOLN: 30.0000 mg | Freq: Once | INTRAMUSCULAR | Status: DC | PRN

## 2018-01-06 MED ORDER — TRAMADOL HCL 50 MG PO TABS: 50.0000 mg | ORAL_TABLET | Freq: Four times a day (QID) | ORAL | 0 refills | Status: DC | PRN

## 2018-01-06 MED ORDER — GADOBUTROL 1 MMOL/ML IV SOLN
7.0000 mL | Freq: Once | INTRAVENOUS | Status: AC | PRN
  Administered 2018-01-06: 7 mL via INTRAVENOUS

## 2018-01-06 MED ORDER — HYDROMORPHONE HCL 1 MG/ML IJ SOLN: 0.2500 mg | INTRAMUSCULAR | Status: DC | PRN

## 2018-01-06 MED ORDER — MIDAZOLAM HCL 2 MG/2ML IJ SOLN
INTRAMUSCULAR | Status: AC
  Filled 2018-01-06: qty 2

## 2018-01-06 MED ORDER — LIDOCAINE HCL (PF) 1 % IJ SOLN
INTRAMUSCULAR | Status: AC
  Filled 2018-01-06: qty 30

## 2018-01-06 MED ORDER — CEFAZOLIN SODIUM-DEXTROSE 2-4 GM/100ML-% IV SOLN
2.0000 g | INTRAVENOUS | Status: DC
  Filled 2018-01-06: qty 100

## 2018-01-06 MED ORDER — ONDANSETRON HCL 4 MG/2ML IJ SOLN: 4.0000 mg | Freq: Once | INTRAMUSCULAR | Status: DC | PRN

## 2018-01-06 MED ORDER — HEPARIN SOD (PORK) LOCK FLUSH 100 UNIT/ML IV SOLN
INTRAVENOUS | Status: DC | PRN
  Administered 2018-01-06: 500 [IU]

## 2018-01-06 MED ORDER — LIDOCAINE HCL (PF) 1 % IJ SOLN
INTRAMUSCULAR | Status: DC | PRN
  Administered 2018-01-06: 5.5 mL

## 2018-01-06 MED ORDER — HEPARIN SOD (PORK) LOCK FLUSH 100 UNIT/ML IV SOLN
INTRAVENOUS | Status: AC
  Filled 2018-01-06: qty 5

## 2018-01-06 SURGICAL SUPPLY — 30 items
ADH SKN CLS APL DERMABOND .7 (GAUZE/BANDAGES/DRESSINGS) ×1
BAG DECANTER FOR FLEXI CONT (MISCELLANEOUS) ×3 IMPLANT
CHLORAPREP W/TINT 10.5 ML (MISCELLANEOUS) ×3 IMPLANT
CLOTH BEACON ORANGE TIMEOUT ST (SAFETY) ×3 IMPLANT
COVER LIGHT HANDLE STERIS (MISCELLANEOUS) ×6 IMPLANT
DECANTER SPIKE VIAL GLASS SM (MISCELLANEOUS) ×3 IMPLANT
DERMABOND ADVANCED (GAUZE/BANDAGES/DRESSINGS) ×2
DERMABOND ADVANCED .7 DNX12 (GAUZE/BANDAGES/DRESSINGS) ×1 IMPLANT
DRAPE C-ARM FOLDED MOBILE STRL (DRAPES) ×3 IMPLANT
ELECT REM PT RETURN 9FT ADLT (ELECTROSURGICAL) ×3
ELECTRODE REM PT RTRN 9FT ADLT (ELECTROSURGICAL) ×1 IMPLANT
GLOVE BIOGEL PI IND STRL 7.0 (GLOVE) ×2 IMPLANT
GLOVE BIOGEL PI INDICATOR 7.0 (GLOVE) ×4
GLOVE ECLIPSE 6.5 STRL STRAW (GLOVE) ×3 IMPLANT
GLOVE SURG SS PI 7.5 STRL IVOR (GLOVE) ×3 IMPLANT
GOWN STRL REUS W/TWL LRG LVL3 (GOWN DISPOSABLE) ×6 IMPLANT
IV NS 500ML (IV SOLUTION) ×2
IV NS 500ML BAXH (IV SOLUTION) ×1 IMPLANT
KIT PORT POWER 8FR ISP MRI (Port) ×3 IMPLANT
KIT TURNOVER KIT A (KITS) ×3 IMPLANT
NEEDLE HYPO 25X1 1.5 SAFETY (NEEDLE) ×3 IMPLANT
PACK MINOR (CUSTOM PROCEDURE TRAY) ×3 IMPLANT
PAD ARMBOARD 7.5X6 YLW CONV (MISCELLANEOUS) ×3 IMPLANT
SET BASIN LINEN APH (SET/KITS/TRAYS/PACK) ×3 IMPLANT
SUT MNCRL AB 4-0 PS2 18 (SUTURE) ×3 IMPLANT
SUT VIC AB 3-0 SH 27 (SUTURE) ×2
SUT VIC AB 3-0 SH 27X BRD (SUTURE) ×1 IMPLANT
SYR 20CC LL (SYRINGE) ×3 IMPLANT
SYR 5ML LL (SYRINGE) ×3 IMPLANT
SYR CONTROL 10ML LL (SYRINGE) ×3 IMPLANT

## 2018-01-06 NOTE — Op Note (Signed)
Patient:  Terry Boyd  DOB:  08-Feb-1941  MRN:  [id number]   Preop Diagnosis:  Right lung carcinoma   Postop Diagnosis:  Same   Procedure:  Portacath insertion  Surgeon: Aviva Signs, MD  Anes: MAC  Indications: Patient is a 77 year old white male with right lung carcinoma who is about to undergo chemotherapy and needs central venous access.  The risks and benefits of the procedure including bleeding, infection, and pneumothorax were fully explained to the patient, who gave informed consent.  Procedure note: The patient was placed in the Trendelenburg position after the left upper chest was prepped and draped using the usual sterile technique with DuraPrep.  Surgical site confirmation was performed.  1% Xylocaine was used for local anesthesia.  An incision was made below the left clavicle.  Subcutaneous pocket was formed.  A needle was advanced into the left subclavian vein using the Seldinger technique without difficulty.  A guidewire was then advanced into the right atrium under fluoroscopic guidance.  An introducer and peel-away sheath were placed over the guidewire.  The catheter was then inserted through the peel-away sheath and the peel-away sheath was removed.  The catheter was then attached to the port and the port placed in subcutaneous pocket.  Adequate positioning was confirmed by fluoroscopy.  Good blood flow was noted on aspiration of the port.  The port was flushed with heparin flush.  Subcutaneous layer was reapproximated using a 3-0 Vicryl interrupted suture.  The skin was closed using a 4-0 Monocryl subcuticular suture.  Dermabond was applied.  All tape and needle counts were correct at the end of the procedure.  The patient was awakened and transferred to PACU in stable condition.  A chest x-ray will be performed at that time.  Complications: None  EBL: Minimal  Specimen: None

## 2018-01-06 NOTE — Anesthesia Preprocedure Evaluation (Signed)
Anesthesia Evaluation  Patient identified by MRN, date of birth, ID band Patient awake    Reviewed: Allergy & Precautions, H&P , NPO status , Patient's Chart, lab work & pertinent test results, reviewed documented beta blocker date and time   Airway Mallampati: II  TM Distance: >3 FB Neck ROM: full    Dental no notable dental hx.    Pulmonary shortness of breath, COPD, Current Smoker,    Pulmonary exam normal breath sounds clear to auscultation       Cardiovascular Exercise Tolerance: Good hypertension, + Peripheral Vascular Disease   Rhythm:regular Rate:Normal     Neuro/Psych PSYCHIATRIC DISORDERS Depression negative neurological ROS     GI/Hepatic Neg liver ROS, hiatal hernia, GERD  ,  Endo/Other  Hypothyroidism   Renal/GU negative Renal ROS  negative genitourinary   Musculoskeletal   Abdominal   Peds  Hematology negative hematology ROS (+)   Anesthesia Other Findings Small cell lung cancer ([place])  Reproductive/Obstetrics negative OB ROS                             Anesthesia Physical Anesthesia Plan  ASA: III  Anesthesia Plan: MAC   Post-op Pain Management:    Induction:   PONV Risk Score and Plan:   Airway Management Planned:   Additional Equipment:   Intra-op Plan:   Post-operative Plan:   Informed Consent: I have reviewed the patients History and Physical, chart, labs and discussed the procedure including the risks, benefits and alternatives for the proposed anesthesia with the patient or authorized representative who has indicated his/her understanding and acceptance.   Dental Advisory Given  Plan Discussed with: CRNA  Anesthesia Plan Comments:         Anesthesia Quick Evaluation

## 2018-01-06 NOTE — Interval H&P Note (Signed)
History and Physical Interval Note:  01/06/2018 10:56 AM  Terry Boyd  has presented today for surgery, with the diagnosis of right lung cancer  The various methods of treatment have been discussed with the patient and family. After consideration of risks, benefits and other options for treatment, the patient has consented to  Procedure(s): INSERTION PORT-A-CATH (Left) as a surgical intervention .  The patient's history has been reviewed, patient examined, no change in status, stable for surgery.  I have reviewed the patient's chart and labs.  Questions were answered to the patient's satisfaction.     Aviva Signs

## 2018-01-06 NOTE — Transfer of Care (Signed)
Immediate Anesthesia Transfer of Care Note  Patient: Terry Boyd  Procedure(s) Performed: INSERTION PORT-A-CATH (Left Chest)  Patient Location: PACU  Anesthesia Type:MAC  Level of Consciousness: awake and patient cooperative  Airway & Oxygen Therapy: Patient Spontanous Breathing  Post-op Assessment: Report given to RN and Post -op Vital signs reviewed and stable  Post vital signs: Reviewed and stable  Last Vitals:  Vitals Value Taken Time  BP    Temp    Pulse    Resp    SpO2      Last Pain:  Vitals:   01/06/18 1056  TempSrc: Oral  PainSc: 0-No pain      Patients Stated Pain Goal: 9 (56/31/49 7026)  Complications: No apparent anesthesia complications

## 2018-01-06 NOTE — Discharge Instructions (Signed)
Implanted Port Home Guide °An implanted port is a type of central line that is placed under the skin. Central lines are used to provide IV access when treatment or nutrition needs to be given through a person’s veins. Implanted ports are used for long-term IV access. An implanted port may be placed because: °· You need IV medicine that would be irritating to the small veins in your hands or arms. °· You need long-term IV medicines, such as antibiotics. °· You need IV nutrition for a long period. °· You need frequent blood draws for lab tests. °· You need dialysis. ° °Implanted ports are usually placed in the chest area, but they can also be placed in the upper arm, the abdomen, or the leg. An implanted port has two main parts: °· Reservoir. The reservoir is round and will appear as a small, raised area under your skin. The reservoir is the part where a needle is inserted to give medicines or draw blood. °· Catheter. The catheter is a thin, flexible tube that extends from the reservoir. The catheter is placed into a large vein. Medicine that is inserted into the reservoir goes into the catheter and then into the vein. ° °How will I care for my incision site? °Do not get the incision site wet. Bathe or shower as directed by your health care provider. °How is my port accessed? °Special steps must be taken to access the port: °· Before the port is accessed, a numbing cream can be placed on the skin. This helps numb the skin over the port site. °· Your health care provider uses a sterile technique to access the port. °? Your health care provider must put on a mask and sterile gloves. °? The skin over your port is cleaned carefully with an antiseptic and allowed to dry. °? The port is gently pinched between sterile gloves, and a needle is inserted into the port. °· Only "non-coring" port needles should be used to access the port. Once the port is accessed, a blood return should be checked. This helps ensure that the port  is in the vein and is not clogged. °· If your port needs to remain accessed for a constant infusion, a clear (transparent) bandage will be placed over the needle site. The bandage and needle will need to be changed every week, or as directed by your health care provider. °· Keep the bandage covering the needle clean and dry. Do not get it wet. Follow your health care provider’s instructions on how to take a shower or bath while the port is accessed. °· If your port does not need to stay accessed, no bandage is needed over the port. ° °What is flushing? °Flushing helps keep the port from getting clogged. Follow your health care provider’s instructions on how and when to flush the port. Ports are usually flushed with saline solution or a medicine called heparin. The need for flushing will depend on how the port is used. °· If the port is used for intermittent medicines or blood draws, the port will need to be flushed: °? After medicines have been given. °? After blood has been drawn. °? As part of routine maintenance. °· If a constant infusion is running, the port may not need to be flushed. ° °How long will my port stay implanted? °The port can stay in for as long as your health care provider thinks it is needed. When it is time for the port to come out, surgery will be   done to remove it. The procedure is similar to the one performed when the port was put in. °When should I seek immediate medical care? °When you have an implanted port, you should seek immediate medical care if: °· You notice a bad smell coming from the incision site. °· You have swelling, redness, or drainage at the incision site. °· You have more swelling or pain at the port site or the surrounding area. °· You have a fever that is not controlled with medicine. ° °This information is not intended to replace advice given to you by your health care provider. Make sure you discuss any questions you have with your health care provider. °Document  Released: 02/15/2005 Document Revised: 07/24/2015 Document Reviewed: 10/23/2012 °Elsevier Interactive Patient Education © 2017 [employer] °Implanted Port Insertion, Care After °This sheet gives you information about how to care for yourself after your procedure. Your health care provider may also give you more specific instructions. If you have problems or questions, contact your health care provider. °What can I expect after the procedure? °After your procedure, it is common to have: °· Discomfort at the port insertion site. °· Bruising on the skin over the port. This should improve over 3-4 days. ° °Follow these instructions at home: °Port care °· After your port is placed, you will get a manufacturer's information card. The card has information about your port. Keep this card with you at all times. °· Take care of the port as told by your health care provider. Ask your health care provider if you or a family member can get training for taking care of the port at home. A home health care nurse may also take care of the port. °· Make sure to remember what type of port you have. °Incision care °· Follow instructions from your health care provider about how to take care of your port insertion site. Make sure you: °? Wash your hands with soap and water before you change your bandage (dressing). If soap and water are not available, use hand sanitizer. °? Change your dressing as told by your health care provider. °? Leave stitches (sutures), skin glue, or adhesive strips in place. These skin closures may need to stay in place for 2 weeks or longer. If adhesive strip edges start to loosen and curl up, you may trim the loose edges. Do not remove adhesive strips completely unless your health care provider tells you to do that. °· Check your port insertion site every day for signs of infection. Check for: °? More redness, swelling, or pain. °? More fluid or blood. °? Warmth. °? Pus or a bad smell. °General  instructions °· Do not take baths, swim, or use a hot tub until your health care provider approves. °· Do not lift anything that is heavier than 10 lb (4.5 kg) for a week, or as told by your health care provider. °· Ask your health care provider when it is okay to: °? Return to work or school. °? Resume usual physical activities or sports. °· Do not drive for 24 hours if you were given a medicine to help you relax (sedative). °· Take over-the-counter and prescription medicines only as told by your health care provider. °· Wear a medical alert bracelet in case of an emergency. This will tell any health care providers that you have a port. °· Keep all follow-up visits as told by your health care provider. This is important. °Contact a health care provider if: °· You cannot   flush your port with saline as directed, or you cannot draw blood from the port. °· You have a fever or chills. °· You have more redness, swelling, or pain around your port insertion site. °· You have more fluid or blood coming from your port insertion site. °· Your port insertion site feels warm to the touch. °· You have pus or a bad smell coming from the port insertion site. °Get help right away if: °· You have chest pain or shortness of breath. °· You have bleeding from your port that you cannot control. °Summary °· Take care of the port as told by your health care provider. °· Change your dressing as told by your health care provider. °· Keep all follow-up visits as told by your health care provider. °This information is not intended to replace advice given to you by your health care provider. Make sure you discuss any questions you have with your health care provider. °Document Released: 12/06/2012 Document Revised: 01/07/2016 Document Reviewed: 01/07/2016 °Elsevier Interactive Patient Education © 2017 [employer] ° °

## 2018-01-06 NOTE — Anesthesia Postprocedure Evaluation (Signed)
Anesthesia Post Note  Patient: Terry Boyd  Procedure(s) Performed: INSERTION PORT-A-CATH (Left Chest)  Patient location during evaluation: PACU Anesthesia Type: MAC Level of consciousness: awake and patient cooperative Pain management: pain level controlled Vital Signs Assessment: post-procedure vital signs reviewed and stable Respiratory status: spontaneous breathing, nonlabored ventilation and respiratory function stable Cardiovascular status: blood pressure returned to baseline Postop Assessment: no apparent nausea or vomiting Anesthetic complications: no     Last Vitals:  Vitals:   01/06/18 1245 01/06/18 1250  BP:    Pulse:    Resp: 20 16  Temp:    SpO2: 100% 100%    Last Pain:  Vitals:   01/06/18 1056  TempSrc: Oral  PainSc: 0-No pain                 Treyven Lafauci J

## 2018-01-09 ENCOUNTER — Encounter (HOSPITAL_COMMUNITY): Payer: Self-pay | Admitting: General Surgery

## 2018-01-11 ENCOUNTER — Ambulatory Visit (HOSPITAL_COMMUNITY): Payer: Medicare Other | Admitting: Hematology

## 2018-01-12 DIAGNOSIS — C3401 Malignant neoplasm of right main bronchus: Secondary | ICD-10-CM | POA: Diagnosis not present

## 2018-01-12 DIAGNOSIS — C3431 Malignant neoplasm of lower lobe, right bronchus or lung: Secondary | ICD-10-CM | POA: Diagnosis not present

## 2018-01-12 DIAGNOSIS — Z9221 Personal history of antineoplastic chemotherapy: Secondary | ICD-10-CM | POA: Diagnosis not present

## 2018-01-12 DIAGNOSIS — Z510 Encounter for antineoplastic radiation therapy: Secondary | ICD-10-CM | POA: Diagnosis not present

## 2018-01-13 ENCOUNTER — Other Ambulatory Visit: Payer: Self-pay

## 2018-01-13 ENCOUNTER — Encounter (HOSPITAL_COMMUNITY): Payer: Self-pay | Admitting: Hematology

## 2018-01-13 ENCOUNTER — Inpatient Hospital Stay (HOSPITAL_BASED_OUTPATIENT_CLINIC_OR_DEPARTMENT_OTHER): Payer: Medicare Other | Admitting: Hematology

## 2018-01-13 ENCOUNTER — Other Ambulatory Visit (HOSPITAL_COMMUNITY): Payer: Self-pay

## 2018-01-13 ENCOUNTER — Inpatient Hospital Stay (HOSPITAL_COMMUNITY): Payer: Medicare Other

## 2018-01-13 DIAGNOSIS — K3184 Gastroparesis: Secondary | ICD-10-CM | POA: Diagnosis not present

## 2018-01-13 DIAGNOSIS — C3431 Malignant neoplasm of lower lobe, right bronchus or lung: Secondary | ICD-10-CM

## 2018-01-13 DIAGNOSIS — K5900 Constipation, unspecified: Secondary | ICD-10-CM | POA: Diagnosis not present

## 2018-01-13 DIAGNOSIS — Z5111 Encounter for antineoplastic chemotherapy: Secondary | ICD-10-CM | POA: Diagnosis not present

## 2018-01-13 DIAGNOSIS — Z720 Tobacco use: Secondary | ICD-10-CM | POA: Diagnosis not present

## 2018-01-13 DIAGNOSIS — C3490 Malignant neoplasm of unspecified part of unspecified bronchus or lung: Secondary | ICD-10-CM

## 2018-01-13 DIAGNOSIS — Z800 Family history of malignant neoplasm of digestive organs: Secondary | ICD-10-CM | POA: Diagnosis not present

## 2018-01-13 DIAGNOSIS — Z808 Family history of malignant neoplasm of other organs or systems: Secondary | ICD-10-CM | POA: Diagnosis not present

## 2018-01-13 DIAGNOSIS — C801 Malignant (primary) neoplasm, unspecified: Secondary | ICD-10-CM

## 2018-01-13 DIAGNOSIS — R5383 Other fatigue: Secondary | ICD-10-CM

## 2018-01-13 NOTE — Assessment & Plan Note (Addendum)
1.  Limited stage small cell lung cancer: - He had a recent fall, CT of the abdomen and pelvis on 11/08/2017 showed irregular narrowing of the right lower lobe bronchus with associated right hilar adenopathy. - Subsequent PET CT scan on 11/28/2017 shows lesion at the inferior lobar bronchus with SUV of 5.5 with the right hilar hypermetabolic and ipsilateral mediastinal adenopathy including right paratracheal, subcarinal and several small prevascular lymph nodes which demonstrate only low-grade metabolic activity. - On 63/81/7711 bronchoscopy and EBUS biopsy done by Dr. Roxan Hockey shows small cell carcinoma of the right bronchus biopsy.  Lymph node aspirations were also consistent with small cell lung cancer. - Patient is a current active smoker, smokes 1 pack/day for 60+ years.  He also had a brief exposure to asbestos.  He is a [place] [employer] veteran, was not sure whether he was exposed to agent orange. - He lives by himself and is independent of ADLs and IADLs.  He had 50 pound weight loss since January and a EGD done in [place] was consistent with gastroparesis. - We talked about the normal prognosis of small cell lung cancer.  For patients with limited stage disease, medial survival usually ranges between 15 to 20 months and reported 5-year survival is about 10 to 13%. - I have recommended combination chemoradiation therapy with 4 cycles of carboplatin and etoposide.  I do not think he is a candidate for cisplatin.  If he gets a partial response, he will be offered PCI. - I have discussed the results of the brain MRI dated 01/06/2018 which did not show any metastatic disease. -He had a port placed on 01/06/2018. -We talked about the side effects of chemotherapy in detail.  Tentatively his chemo will be started on 01/18/2018. - We will evaluate him in 2 weeks after chemotherapy to assess side effects.  He will meet with our dietitian today because of his recent weight loss and gastroparesis.

## 2018-01-13 NOTE — Progress Notes (Signed)
[address] [place], [place] [address]   CLINIC:  Medical Oncology/Hematology  PCP:  Rory Percy, MD [place] [address] [phone]   REASON FOR VISIT:  Follow-up for limited stage small cell lung cancer.  CURRENT THERAPY: Combination chemo radiation therapy to start next week.  BRIEF ONCOLOGIC HISTORY:    Small cell lung cancer ([place])   12/30/2017 Initial Diagnosis    Small cell lung cancer ([place])    01/17/2018 -  Chemotherapy    The patient had palonosetron (ALOXI) injection 0.25 mg, 0.25 mg, Intravenous,  Once, 0 of 4 cycles CARBOplatin (PARAPLATIN) in sodium chloride 0.9 % 100 mL chemo infusion, , Intravenous,  Once, 0 of 4 cycles etoposide (VEPESID) 170 mg in sodium chloride 0.9 % 500 mL chemo infusion, 100 mg/m2, Intravenous,  Once, 0 of 4 cycles fosaprepitant (EMEND) 150 mg, dexamethasone (DECADRON) 12 mg in sodium chloride 0.9 % 145 mL IVPB, , Intravenous,  Once, 0 of 4 cycles  for chemotherapy treatment.       CANCER STAGING: Cancer Staging No matching staging information was found for the patient.   INTERVAL HISTORY:  Mr. Duman 77 y.o. male returns for follow-up of his limited stage small cell lung cancer.  He complains of some fatigue.  He also has some constipation.  Appetite is 25%.  He also has gastroparesis which prevents him from eating much.  Energy is 50%.  He is drinking 1 can of Ensure daily.  He is continuing to smoke.  He lives at home and is independent of ADLs and IADLs.    REVIEW OF SYSTEMS:  Review of Systems  Constitutional: Positive for fatigue.  Gastrointestinal: Positive for constipation.  All other systems reviewed and are negative.    PAST MEDICAL/SURGICAL HISTORY:  Past Medical History:  Diagnosis Date  . AAA (abdominal aortic aneurysm) ([place])   . Alternating constipation and diarrhea   . Arthritis   . Chronic back pain   . COPD (chronic obstructive pulmonary disease) ([place])   .  Depression   . GERD (gastroesophageal reflux disease)   . History of hiatal hernia   . Hypertension    not on medications  . Hypothyroidism   . Lung cancer ([place])   . Peripheral vascular disease ([place])   . Smoker   . Thyroid disease    Past Surgical History:  Procedure Laterality Date  . ABDOMINAL AORTAGRAM N/A 03/14/2012   Procedure: ABDOMINAL Maxcine Ham;  Surgeon: Serafina Mitchell, MD;  Location: [facility] CATH LAB;  Service: Cardiovascular;  Laterality: N/A;  . ABDOMINAL AORTIC ANEURYSM REPAIR  04-22-2009   Stent graft repair of AAA  . APPENDECTOMY    . BRONCHIAL BRUSHINGS Right 11/30/2017   Procedure: BRONCHIAL BRUSHINGS;  Surgeon: Sinda Du, MD;  Location: [facility] ENDO SU[facility];  Service: Cardiopulmonary;  Laterality: Right;  . BRONCHIAL WASHINGS Right 11/30/2017   Procedure: BRONCHIAL WASHINGS;  Surgeon: Sinda Du, MD;  Location: [facility];  Service: Cardiopulmonary;  Laterality: Right;  . BYPASS GRAFT POPLITEAL TO POPLITEAL  03/20/2012   Procedure: BYPASS GRAFT POPLITEAL TO POPLITEAL;  Surgeon: Elam Dutch, MD;  Location: [facility];  Service: Vascular;  Laterality: Left;  ABOVE THE KNEE POPLITEAL ARTERY TO BELOW THE KNEE POPLITEAL ARTERY BYPASS GRAFT USING REVERSE LEFT GREATER SAPHENOUS VEIN   . ENDARTERECTOMY POPLITEAL  03/20/2012   Procedure: ENDARTERECTOMY POPLITEAL;  Surgeon: Elam Dutch, MD;  Location: [facility];  Service: Vascular;  Laterality: Left;   LIGATION  OF LEFT LEG POPLITEAL ANEURYSM  . FLEXIBLE BRONCHOSCOPY N/A 11/30/2017   Procedure: FLEXIBLE BRONCHOSCOPY;  Surgeon: Sinda Du, MD;  [facility];  Service: Cardiopulmonary;  Laterality: N/A;  . INTRAOPERATIVE ARTERIOGRAM  03/20/2012   Procedure: INTRA OPERATIVE ARTERIOGRAM;  Surgeon: Elam Dutch, MD;  Location: [facility];  Service: Vascular;  Laterality: Left;  TIMES ONE  . PORTACATH PLACEMENT Left 01/06/2018   Procedure: INSERTION PORT-A-CATH;  Surgeon: Aviva Signs, MD;  Location: [facility];  Service:  General;  Laterality: Left;  Marland Kitchen VASECTOMY  1982  . VIDEO BRONCHOSCOPY WITH ENDOBRONCHIAL ULTRASOUND N/A 12/23/2017   Procedure: VIDEO BRONCHOSCOPY WITH ENDOBRONCHIAL ULTRASOUND;  Surgeon: Melrose Nakayama, MD;  Location: [facility];  Service: Thoracic;  Laterality: N/A;     SOCIAL HISTORY:  Social History   Socioeconomic History  . Marital status: Divorced    Spouse name: Not on file  . Number of children: 2  . Years of education: Not on file  . Highest education level: Not on file  Occupational History  . Occupation: [place]     Comment: Agent Orange exposure  . Occupation: Sales executive    Comment: [place] exsposure  Social Needs  . Financial resource strain: Not hard at all  . Food insecurity:    Worry: Never true    Inability: Never true  . Transportation needs:    Medical: No    Non-medical: No  Tobacco Use  . Smoking status: Current Every Day Smoker    Years: 62.00    Types: Cigars  . Smokeless tobacco: Never Used  . Tobacco comment: pt states he smokes about 15 cigars per day  Substance and Sexual Activity  . Alcohol use: No  . Drug use: No  . Sexual activity: Yes    Birth control/protection: None  Lifestyle  . Physical activity:    Days per week: 0 days    Minutes per session: 0 min  . Stress: To some extent  Relationships  . Social connections:    Talks on phone: More than three times a week    Gets together: Once a week    Attends religious service: More than 4 times per year    Active member of club or organization: No    Attends meetings of clubs or organizations: Never    Relationship status: Divorced  . Intimate partner violence:    Fear of current or ex partner: No    Emotionally abused: No    Physically abused: No    Forced sexual activity: No  Other Topics Concern  . Not on file  Social History Narrative   Sedentary    FAMILY HISTORY:  Family History  Problem Relation Age of Onset  . Diabetes Mother   . Stroke Mother   . Heart  disease Father   . Diabetes Father   . Heart disease Sister        CABG x 4  . Cancer Brother        throat cancer  . Diabetes Sister   . Heart disease Sister   . Cancer Brother   . Kidney disease Son     CURRENT MEDICATIONS:  Outpatient Encounter Medications as of 01/13/2018  Medication Sig  . Ipratropium-Albuterol (COMBIVENT RESPIMAT) 20-100 MCG/ACT AERS respimat Inhale 1 puff into the lungs every 6 (six) hours as needed for wheezing.  Marland Kitchen levothyroxine (SYNTHROID, LEVOTHROID) 137 MCG tablet Take 137 mcg by mouth daily before breakfast.   . LORazepam (ATIVAN) 1 MG tablet  Take 1 mg by mouth at bedtime.   . metoCLOPramide (REGLAN) 5 MG tablet Take 5 mg by mouth daily before lunch.  Marland Kitchen omeprazole (PRILOSEC) 20 MG capsule Take 20 mg by mouth daily.  Marland Kitchen POTASSIUM PO Take 1 tablet by mouth daily.  . sertraline (ZOLOFT) 50 MG tablet Take 50 mg by mouth every morning.   . simvastatin (ZOCOR) 10 MG tablet Take 10 mg by mouth at bedtime.  . traMADol (ULTRAM) 50 MG tablet Take 1 tablet (50 mg total) by mouth every 6 (six) hours as needed.   No facility-administered encounter medications on file as of 01/13/2018.     ALLERGIES:  Allergies  Allergen Reactions  . Morphine And Related Shortness Of Breath and Other (See Comments)    Hallucinations pt has taken hydromorphone before     PHYSICAL EXAM:  ECOG Performance status: 1  Vitals:   01/13/18 0859  BP: 105/63  Pulse: 66  Resp: 16  Temp: (!) 97.5 F (36.4 C)  SpO2: 98%   Filed Weights   01/13/18 0859  Weight: 138 lb 6.4 oz (62.8 kg)    Physical Exam  Cardiovascular: Regular rhythm.  Pulmonary/Chest: Breath sounds normal.  Abdominal: Soft. He exhibits no mass. There is no tenderness.  Musculoskeletal: He exhibits no edema.  Skin: No rash noted.     LABORATORY DATA:  I have reviewed the labs as listed.  CBC    Component Value Date/Time   WBC 11.0 (H) 12/23/2017 0755   RBC 4.81 12/23/2017 0755   HGB 14.9 12/23/2017  0755   HCT 46.6 12/23/2017 0755   PLT 235 12/23/2017 0755   MCV 96.9 12/23/2017 0755   MCH 31.0 12/23/2017 0755   MCHC 32.0 12/23/2017 0755   RDW 12.8 12/23/2017 0755   CMP Latest Ref Rng & Units 12/23/2017 11/29/2017 03/21/2012  Glucose 70 - 99 mg/dL 102(H) 129(H) 122(H)  BUN 8 - 23 mg/dL _0 Creatinine 0.61 - 1.24 mg/dL 1.04 1.07 1.26  Sodium 135 - 145 mmol/L 138 141 134(L)  Potassium 3.5 - 5.1 mmol/L 4.1 4.4 4.2  Chloride 98 - 111 mmol/L 106 102 101  CO2 22 - 32 mmol/L _1 Calcium 8.9 - 10.3 mg/dL 9.0 9.4 8.2(L)  Total Protein 6.5 - 8.1 g/dL 6.7 - -  Total Bilirubin 0.3 - 1.2 mg/dL 0.7 - -  Alkaline Phos 38 - 126 U/L 61 - -  AST 15 - 41 U/L 20 - -  ALT 0 - 44 U/L 15 - -       DIAGNOSTIC IMAGING:  I have independently reviewed his MRI of the brain from 01/06/2018..      ASSESSMENT & PLAN:   Small cell lung cancer ([place]) 1.  Limited stage small cell lung cancer: - He had a recent fall, CT of the abdomen and pelvis on 11/08/2017 showed irregular narrowing of the right lower lobe bronchus with associated right hilar adenopathy. - Subsequent PET CT scan on 11/28/2017 shows lesion at the inferior lobar bronchus with SUV of 5.5 with the right hilar hypermetabolic and ipsilateral mediastinal adenopathy including right paratracheal, subcarinal and several small prevascular lymph nodes which demonstrate only low-grade metabolic activity. - On 11/57/2620 bronchoscopy and EBUS biopsy done by Dr. Roxan Hockey shows small cell carcinoma of the right bronchus biopsy.  Lymph node aspirations were also consistent with small cell lung cancer. - Patient is a current active smoker, smokes 1 pack/day for 60+ years.  He also had a brief  exposure to asbestos.  He is a [place] [employer] veteran, was not sure whether he was exposed to agent orange. - He lives by himself and is independent of ADLs and IADLs.  He had 50 pound weight loss since January and a EGD done in [place] was consistent with  gastroparesis. - We talked about the normal prognosis of small cell lung cancer.  For patients with limited stage disease, medial survival usually ranges between 15 to 20 months and reported 5-year survival is about 10 to 13%. - I have recommended combination chemoradiation therapy with 4 cycles of carboplatin and etoposide.  I do not think he is a candidate for cisplatin.  If he gets a partial response, he will be offered PCI. - I have discussed the results of the brain MRI dated 01/06/2018 which did not show any metastatic disease. -He had a port placed on 01/06/2018. -We talked about the side effects of chemotherapy in detail.  Tentatively his chemo will be started on 01/18/2018. - We will evaluate him in 2 weeks after chemotherapy to assess side effects.      Orders placed this encounter:  No orders of the defined types were placed in this encounter.     Derek Jack, MD [place] [phone]

## 2018-01-13 NOTE — Progress Notes (Signed)
START ON PATHWAY REGIMEN - Small Cell Lung     A cycle is every 21 days:     Etoposide      Carboplatin   **Always confirm dose/schedule in your pharmacy ordering system**  Patient Characteristics: Newly Diagnosed, Preoperative or Nonsurgical Candidate (Clinical Staging), First Line, Limited Stage, Nonsurgical Candidate Therapeutic Status: Newly Diagnosed, Preoperative or Nonsurgical Candidate (Clinical Staging) AJCC T Category: cTX AJCC N Category: cN2 AJCC M Category: cM0 AJCC 8 Stage Grouping: Unknown Stage Classification: Limited Surgical Candidacy: Nonsurgical Candidate Intent of Therapy: Curative Intent, Discussed with Patient

## 2018-01-13 NOTE — Progress Notes (Signed)
Nutrition Assessment   Reason for Assessment:  Weight loss   ASSESSMENT:   77 year old male with new diagnosis of lung cancer.  Planning to start chemotherapy and radiation therapy next week.  Past medical history of GERD, AAA, depression, HTN, PVD, hiatal hernia.  Noted work up for gastroparesis (no hx of DM), current smoker   Met with patient and daughter following MD visit this am.  Daughter reports since June patient appetite has decreased due to stomach pain, feeling full, vomiting after eating.  Had EGD and found retained food in stomach and gastroparesis was diagnosis (July 2019).  Daughter reports endoscopy this past Thursday and "they stretched it."  Daughter reports patient was eating well for 3 days after EGD but has not gone back to eating small amounts of food.  Patient reports that he is drinking 1 ensure original daily.  [facility] to drink 3 and I started drinking 3 but started feeling full and vomiting after drinking them so cut back to 2 then 1 due to persistent symptoms.  Patient reports that yesterday drank coffee with cream and sugar for breakfast. Around noon drank ensure original (220 calories, 9 g protein) and supper around 5:30 of  1/2 can progresso chicken noodle soup.  Drinks about 3-4 cups coffee per day with cream and sugar and 1/2 bottle of water.  "If I eat much I vomit or feel like I am going to vomit.  Patient reports loose, not watery stool usually every am around 1-2 am.  Was some concern about constipation but patient reports that he is not constipated at this time.   Nutrition Focused Physical Exam: not performed, fully clothed wearing jacket today.  Medications: ativan, patient said he stopped reglan per [facility], prilosec, K, zoloft   Labs: reviewed   Anthropometrics:   Height: 68.5 inches Weight: 138 lb 6.4 oz today UBW: 160 lb in July per chart.  50 lb weight loss since March per daughter.   BMI: 20   14% weight loss in the last 4 months,  significant  Estimated Energy Needs  Kcals: 1800-2200 calories/d Protein: 95-126 g/d Fluid: >1.8 L/d   NUTRITION DIAGNOSIS: Malnutrition related to gastroparesis (?), cancer as evidenced by 14% weight loss in the last 4 months and eating < or equal to 75% of energy needs for > or equal to 1 month.     MALNUTRITION DIAGNOSIS: Patient meets criteria for severe malnutrition in chronic illness as evidenced by 14% weight loss in the last 4 months and eating < or equal to 75% energy needs for > or equal to 1 month   INTERVENTION:  Encouraged small frequent meals Encouraged switching to high calorie shake.  Options discussed and 1st case of ensure enlive given today.  Discussed strategies to increase calories and protein.  Cautioned patient regarding adding increased fat to foods for gastroparesis.   [facility] as to symptoms improved after 3 days then now back to eating small amounts.  Daughter reported scheduled follow up this Thursday but cancelled due to treatments.   Recommend MVI daily If aggressive nutrition therapy is wanted would consider feeding tube placement. J-tube may be preferred with gastroparesis.   Contact information given  MONITORING, EVALUATION, GOAL: weight trends, intake   Next Visit: Friday, November 22 during infusion  Kizzi Overbey B. Zenia Resides, [place], [employer] Registered Dietitian [phone] (pager)

## 2018-01-16 ENCOUNTER — Other Ambulatory Visit (HOSPITAL_COMMUNITY): Payer: Self-pay | Admitting: Pharmacist

## 2018-01-16 DIAGNOSIS — C3401 Malignant neoplasm of right main bronchus: Secondary | ICD-10-CM | POA: Diagnosis not present

## 2018-01-16 DIAGNOSIS — Z510 Encounter for antineoplastic radiation therapy: Secondary | ICD-10-CM | POA: Diagnosis not present

## 2018-01-16 DIAGNOSIS — C3431 Malignant neoplasm of lower lobe, right bronchus or lung: Secondary | ICD-10-CM | POA: Diagnosis not present

## 2018-01-16 DIAGNOSIS — Z9221 Personal history of antineoplastic chemotherapy: Secondary | ICD-10-CM | POA: Diagnosis not present

## 2018-01-16 MED ORDER — LIDOCAINE-PRILOCAINE 2.5-2.5 % EX CREA: TOPICAL_CREAM | CUTANEOUS | 3 refills | Status: DC

## 2018-01-16 MED ORDER — PROCHLORPERAZINE MALEATE 10 MG PO TABS: 10.0000 mg | ORAL_TABLET | Freq: Four times a day (QID) | ORAL | 1 refills | Status: DC | PRN

## 2018-01-16 NOTE — Progress Notes (Signed)
Chemotherapy teaching pulled together. 

## 2018-01-16 NOTE — Patient Instructions (Signed)
[facility] Chemotherapy Teaching   You have been diagnosed with limited stage small cell lung cancer. We are going to be treating you with curative intent.  We will be giving you chemotherapy through your port a cath every 21 days. Each 21 days is 1 cycle, and with each cycle you will get chemotherapy 3 days.  We are planning to give you 4 cycles and then we will do scans to see how you have responded.  We are going to be giving you Vepesid (Etoposide, VP-16) and Paraplatin (Carboplatin).  You will get both medications on day 1 and then you will only get Vepesid (Etoposide) on days 2 and 3.  You will see the doctor regularly throughout treatment.  We monitor your lab work prior to every treatment. The doctor monitors your response to treatment by the way you are feeling, your blood work, and scans periodically.  There will be wait times while you are here for treatment.  It will take about 30 minutes to 1 hour for your lab work to result.  Then there will be wait times while pharmacy mixes your medications.   You will receive the following pre-medications prior to receiving chemotherapy:  Aloxi - high powered nausea/vomiting prevention medication used for chemotherapy patients.   Dexamethasone - steroid - given to reduce the risk of you having an allergic type reaction to the chemotherapy. Dexamethasone can cause you to feel energized, nervous/anxious/jittery, make you have trouble sleeping, and/or make you feel hot/flushed in the face/neck and/or look pink/red in the face/neck. These side effects will pass as the medicine wears off.   Emend - high powered nausea/vomiting prevention medication used for chemotherapy patients.   POTENTIAL SIDE EFFECTS OF TREATMENT:  Etoposide (Generic Name) Other Names: VePesid, VP-16  About This Drug Etoposide is a drug used to treat cancer. This drug is given in the vein (IV) or by mouth.  This drug will take one hour to infuse.    Possible Side  Effects (More Common) . Nausea and throwing up (vomiting). These symptoms may happen within one to six hours after getting this drug and may last up to 72 hours. Medicines are available to stop or lessen these side effects . Bone marrow depression. This is a decrease in the number of white blood cells, red blood cells, and platelets. This may raise your risk of infection, make you tired and weak (fatigue), and raise your risk of bleeding. . Decreased appetite (decreased hunger) . Hair loss. Most patients lost hair on their scalp and body. You may notice hair thinning five to seven days after getting this drug. Often hair loss is temporary; your hair should grow back when treatment is done. . Fatigue  Possible Side Effects (Less Common) . Loose bowel movements (diarrhea) that may last for several days . Soreness of the mouth and throat. You may have red areas, white patches, or sores that hurt. . Increased total bilirubin in your blood. This may mean that you have changes in your liver function. Your blood work will be checked by your doctor. . Effects on the nerves are called peripheral neuropathy. You may feel numbness, tingling, or pain in your hands and feet. It may be hard for you to button your clothes, open jars, or walk as usual. The effect on the nerves may get worse with more doses of the drug. These effects get better in some people after the drug is stopped but it does not get better in all  people. . Rash . Blood pressure may be low while getting this drug in an IV.  Allergic Reactions Serious allergic reactions including anaphylaxis are rare. While you are getting this drug in your vein (IV), tell your nurse right away if you have any of these symptoms of an allergic reaction: . trouble catching your breath . feeling like your tongue or throat are swelling . feeling your heart beat quickly or in a not normal way (palpitations) . feeling dizzy or lightheaded . flushing, itching, rash,  and/or hives  Treating Side Effects . If you are getting this drug in an IV, tell your nurse know right away if you are feeling lightheaded or dizzy. . Talk with your nurse about getting a wig before you lose your hair. Also, call the [place] at 800-ACS-2345 to find out information about the "Look Good, Feel Better" program close to where you live. It is a free program where women getting chemotherapy can learn about wigs, turbans and scarves as well as makeup techniques and skin and nail care. . Drink 6-8 cups of fluids each day unless your doctor has told you to limit your fluid intake due to some other health problem. A cup is 8 ounces of fluid. If you throw up or have loose bowel movements, you should drink more fluids so that you do not become dehydrated (lack water in the body from losing too much fluid). . Mouth care is very important. Your mouth care should consist of regular, gentle cleaning of your teeth or dentures and rinsing your mouth with a mixture of  teaspoon of salt in 8 ounces of water or  teaspoon of sodium bicarbonate (baking soda) in 8 ounces of water. This should be done at least after every meal and at bedtime. . If you have mouth sores, avoid mouthwash that contains alcohol. Also avoid alcohol and smoking because they can bother your mouth and throat. . If you get a rash do not put anything on it unless your doctor or nurse says you may. Keep the area around the rash clean and dry. Let your doctor know right away if you get a rash while on this medicine. . Ask your doctor or nurse about medicine that is available to help stop or lessen nausea or throwing up. . Be careful when cooking, walking, and handling sharp objects and hot liquids.  Food and Drug Interactions There are no known interactions of etoposide with food. This drug may interact with other medication. Tell your doctor and pharmacist about all the medication and dietary supplements (vitamins,  minerals, herbs and others) that you are taking at this time. The safety and effectiveness of dietary supplements and alternative diets are often unknown. Using these might affect your cancer or interfere with your treatment. Until more is known, you should not use dietary supplements or alternative diets without your cancer doctor's help.  Important Information . Take etoposide pills one hour before eating or two to three hours after eating. . Store this drug in the refrigerator. . Missed dose: If you miss a dose, take it as soon as you remember, unless it is close to the time of your next dose. If it is close to your next dose, just take the next dose at your normal time. Do not take 2 doses at once. Do not take extra doses.  When to Call the Doctor Call your doctor or nurse right away if you have any of these symptoms: . Trouble breathing or  feeling short of breath . Fever of 100.5 F (38 C) or higher . Chills . Easy bleeding or bruising . Rash or itching . Feeling dizzy or lightheaded . Feeling that your heart is beating in a fast or not normal way (palpitations) . Loose bowel movements (diarrhea) more than 4 times a day or diarrhea with weakness or feeling lightheaded . Feeling confused . Nausea that stops you from eating or drinking . Throwing up more than 3 times a day Call your doctor or nurse as soon as possible if you have any of these symptoms: . Numbness, tingling, decreased feeling or weakness in fingers, toes, arms, or legs . Trouble walking or changes in the way you walk . Pain in your mouth or throat that makes it hard to eat or drink  Sexual Problems and Reproductive Concerns . Infertility warning: Sexual problems and reproduction concerns may happen. In both men and women, this drug may affect your ability to have children. This cannot be determined before your treatment. Talk with your doctor or nurse if you plan to have children. Ask for information on sperm or egg  banking. . In men, this drug may interfere with your ability to make sperm, but it should not change your ability to have sexual relations. . In women, menstrual bleeding may become irregular or stop while you are getting this drug. Do not assume that you cannot become pregnant if you do not have a menstrual period. . Women may go through signs of menopause (change of life) like vaginal dryness or itching. Vaginal lubricants can be used to lessen vaginal dryness, itching, and pain during sexual relations. . Genetic counseling is available for you to talk about the effects of this drug therapy on future pregnancies. Also, a genetic counselor can look at the possible risk of problems in the unborn baby due to this medicine if an exposure happens during pregnancy. . Pregnancy warning: This drug may have harmful effects on the unborn child, so effective methods of birth control should be used during your cancer treatment. Ask your doctor or nurse about effective methods of birth control. . Breast feeding warning: It is not known if this drug passes into breast milk. For this reason, women should talk to their doctor about the risks and benefits of breast feeding during treatment with this drug because this drug may enter the breast milk and badly harm a breast feeding baby.   Carboplatin (Generic Name) Other Names: Paraplatin, CBDCA  About This Drug Carboplatin is a drug used to treat cancer. This drug is given in the vein (IV).  This drug will take 30 minutes to infuse.    Possible Side Effects (More Common) . Nausea and throwing up (vomiting). These symptoms may happen within a few hours after your treatment and may last up to 24 hours. Medicines are available to stop or lessen these side effects. . Bone marrow depression. This is a decrease in the number of white blood cells, red blood cells, and platelets. This may raise your risk of infection, make you tired and weak (fatigue), and raise your risk  of bleeding. . Soreness of the mouth and throat. You may have red areas, white patches, or sores that hurt. . This drug may affect how your kidneys work. Your kidney function will be checked as needed. . Electrolyte changes. Your blood will be checked for electrolyte changes as needed.  Possible Side Effects (Less Common) . Hair loss. Some patients lose their hair on the  scalp and body. You may notice your hair thinning seven to 14 days after getting this drug. . Effects on the nerves are called peripheral neuropathy. You may feel numbness, tingling, or pain in your hands and feet. It may be hard for you to button your clothes, open jars, or walk as usual. The effect on the nerves may get worse with more doses of the drug. These effects get better in some people after the drug is stopped but it does not get better in all people. . Loose bowel movements (diarrhea) that may last for several days . Decreased hearing or ringing in the ears . Changes in the way food and drinks taste . Changes in liver function. Your liver function will be checked as needed.  Allergic Reactions Serious allergic reactions including anaphylaxis are rare. While you are getting this drug in your vein (IV), tell your nurse right away if you have any of these symptoms of an allergic reaction: . Trouble catching your breath . Feeling like your tongue or throat are swelling . Feeling your heart beat quickly or in a not normal way (palpitations) . Feeling dizzy or lightheaded . Flushing, itching, rash, and/or hives Treating Side Effects . Drink 6-8 cups of fluids each day unless your doctor has told you to limit your fluid intake due to some other health problem. A cup is 8 ounces of fluid. If you throw up or have loose bowel movements, you should drink more fluids so that you do not become dehydrated (lack water in the body from losing too much fluid). . Mouth care is very important. Your mouth care should consist of  routine, gentle cleaning of your teeth or dentures and rinsing your mouth with a mixture of 1/2 teaspoon of salt in 8 ounces of water or  teaspoon of baking soda in 8 ounces of water. This should be done at least after each meal and at bedtime. . If you have mouth sores, avoid mouthwash that has alcohol. Avoid alcohol and smoking because they can bother your mouth and throat. . If you have numbness and tingling in your hands and feet, be careful when cooking, walking, and handling sharp objects and hot liquids. . Talk with your nurse about getting a wig before you lose your hair. Also, call the [place] at 800-ACS-2345 to find out information about the "Look Good, Feel Better" program close to where you live. It is a free program where women getting chemotherapy can learn about wigs, turbans and scarves as well as makeup techniques and skin and nail care.  Food and Drug Interactions There are no known interactions of carboplatin with food. This drug may interact with other medicines. Tell your doctor and pharmacist about all the medicines and dietary supplements (vitamins, minerals, herbs and others) that you are taking at this time. The safety and use of dietary supplements and alternative diets are often not known. Using these might affect your cancer or interfere with your treatment. Until more is known, you should not use dietary supplements or alternative diets without your cancer doctor's help.  When to Call the Doctor Call your doctor or nurse right away if you have any of these symptoms: . Fever of 100.5 F (38 C) or above; chills . Bleeding or bruising that is not normal . Wheezing or trouble breathing . Nausea that stops you from eating or drinking . Throwing up more than once a day . Rash or itching . Loose bowel movements (diarrhea) more  than four times a day or diarrhea with weakness or feeling lightheaded . Call your doctor or nurse as soon as possible if any of these  symptoms happen: . Numbness, tingling, decreased feeling or weakness in fingers, toes, arms, or legs . Change in hearing, ringing in the ears . Blurred vision or other changes in eyesight . Decreased urine . Yellowing of skin or eyes  Sexual Problems and Reproductive Concerns Sexual problems and reproduction concerns may happen. In both men and women, this drug may affect your ability to have children. This cannot be determined before your treatment. Talk with your doctor or nurse if you plan to have children. Ask for information on sperm or egg banking. In men, this drug may interfere with your ability to make sperm, but it should not change your ability to have sexual relations. In women, menstrual bleeding may become irregular or stop while you are getting this drug. Do not assume that you cannot become pregnant if you do not have a menstrual period. Women may go through signs of menopause (change of life) like vaginal dryness or itching. Vaginal lubricants can be used to lessen vaginal dryness, itching, and pain during sexual relations. Genetic counseling is available for you to talk about the effects of this drug therapy on future pregnancies. Also, a genetic counselor can look at the possible risk of problems in the unborn baby due to this medicine if an exposure happens during pregnancy. . Pregnancy warning: This drug may have harmful effects on the unborn child, so effective methods of birth control should be used during your cancer treatment. . Breast feeding warning: It is not known if this drug passes into breast milk. For this reason, women should talk to their doctor about the risks and benefits of breast feeding during treatment with this drug because this drug may enter the breast milk and badly harm a breast feeding baby.  SELF CARE ACTIVITIES WHILE ON CHEMOTHERAPY:  Hydration Increase your fluid intake 48 hours prior to treatment and drink at least 8 to 12 cups (64 ounces) of  water/decaffeinated beverages per day after treatment. You can still have your cup of coffee or soda but these beverages do not count as part of your 8 to 12 cups that you need to drink daily. No alcohol intake.  Medications Continue taking your normal prescription medication as prescribed.  If you start any new herbal or new supplements please let us know first to make sure it is safe.  Mouth Care Have teeth cleaned professionally before starting treatment. Keep dentures and partial plates clean. Use soft toothbrush and do not use mouthwashes that contain alcohol. Biotene is a good mouthwash that is available at most pharmacies or may be ordered by calling [phone]. Use warm salt water gargles (1 teaspoon salt per 1 quart warm water) before and after meals and at bedtime. Or you may rinse with 2 tablespoons of three-percent hydrogen peroxide mixed in eight ounces of water. If you are still having problems with your mouth or sores in your mouth please call the clinic. If you need dental work, please let the doctor know before you go for your appointment so that we can coordinate the best possible time for you in regards to your chemo regimen. You need to also let your dentist know that you are actively taking chemo. We may need to do labs prior to your dental appointment.  Skin Care Always use sunscreen that has not expired and with SPF Nancy Fetter  Protection Factor) of 50 or higher. Wear hats to protect your head from the sun. Remember to use sunscreen on your hands, ears, face, & feet.  Use good moisturizing lotions such as udder cream, eucerin, or even Vaseline. Some chemotherapies can cause dry skin, color changes in your skin and nails.    . Avoid long, hot showers or baths. . Use gentle, fragrance-free soaps and laundry detergent. . Use moisturizers, preferably creams or ointments rather than lotions because the thicker consistency is better at preventing skin dehydration. Apply the cream or  ointment within 15 minutes of showering. Reapply moisturizer at night, and moisturize your hands every time after you wash them.  Hair Loss (if your doctor says your hair will fall out)  . If your doctor says that your hair is likely to fall out, decide before you begin chemo whether you want to wear a wig. You may want to shop before treatment to match your hair color. . Hats, turbans, and scarves can also camouflage hair loss, although some people prefer to leave their heads uncovered. If you go bare-headed outdoors, be sure to use sunscreen on your scalp. . Cut your hair short. It eases the inconvenience of shedding lots of hair, but it also can reduce the emotional impact of watching your hair fall out. . Don't perm or color your hair during chemotherapy. Those chemical treatments are already damaging to hair and can enhance hair loss. Once your chemo treatments are done and your hair has grown back, it's OK to resume dyeing or perming hair. With chemotherapy, hair loss is almost always temporary. But when it grows back, it may be a different color or texture. In older adults who still had hair color before chemotherapy, the new growth may be completely gray.  Often, new hair is very fine and soft.  Infection Prevention Please wash your hands for at least 30 seconds using warm soapy water. Handwashing is the #1 way to prevent the spread of germs. Stay away from sick people or people who are getting over a cold. If you develop respiratory systems such as green/yellow mucus production or productive cough or persistent cough let us know and we will see if you need an antibiotic. It is a good idea to keep a pair of gloves on when going into grocery stores/[employer] to decrease your risk of coming into contact with germs on the carts, etc. Carry alcohol hand gel with you at all times and use it frequently if out in public. If your temperature reaches 100.5 or higher please call the clinic and let us know.   If it is after hours or on the weekend please go to the ER if your temperature is over 100.5.  Please have your own personal thermometer at home to use.    Sex and bodily fluids If you are going to have sex, a condom must be used to protect the person that isn't taking chemotherapy. Chemo can decrease your libido (sex drive). For a few days after chemotherapy, chemotherapy can be excreted through your bodily fluids.  When using the toilet please close the lid and flush the toilet twice.  Do this for a few day after you have had chemotherapy.   Effects of chemotherapy on your sex life Some changes are simple and won't last long. They won't affect your sex life permanently. Sometimes you may feel: . too tired . not strong enough to be very active . sick or sore  . not in the  mood . anxious or low Your anxiety might not seem related to sex. For example, you may be worried about the cancer and how your treatment is going. Or you may be worried about money, or about how you family are coping with your illness. These things can cause stress, which can affect your interest in sex. It's important to talk to your partner about how you feel. Remember - the changes to your sex life don't usually last long. There's usually no medical reason to stop having sex during chemo. The drugs won't have any long term physical effects on your performance or enjoyment of sex. Cancer can't be passed on to your partner during sex  Contraception It's important to use reliable contraception during treatment. Avoid getting pregnant while you or your partner are having chemotherapy. This is because the drugs may harm the baby. Sometimes chemotherapy drugs can leave a man or woman infertile.  This means you would not be able to have children in the future. You might want to talk to someone about permanent infertility. It can be very difficult to learn that you may no longer be able to have children. Some people find counselling  helpful. There might be ways to preserve your fertility, although this is easier for men than for women. You may want to speak to a fertility expert. You can talk about sperm banking or harvesting your eggs. You can also ask about other fertility options, such as donor eggs. If you have or have had breast cancer, your doctor might advise you not to take the contraceptive pill. This is because the hormones in it might affect the cancer.  It is not known for sure whether or not chemotherapy drugs can be passed on through semen or secretions from the vagina. Because of this some doctors advise people to use a barrier method if you have sex during treatment. This applies to vaginal, anal or oral sex. Generally, doctors advise a barrier method only for the time you are actually having the treatment and for about a week after your treatment. Advice like this can be worrying, but this does not mean that you have to avoid being intimate with your partner. You can still have close contact with your partner and continue to enjoy sex.  Animals If you have cats or birds we just ask that you not change the litter or change the cage.  Please have someone else do this for you while you are on chemotherapy.   Food Safety During and After Cancer Treatment Food safety is important for people both during and after cancer treatment. Cancer and cancer treatments, such as chemotherapy, radiation therapy, and stem cell/bone marrow transplantation, often weaken the immune system. This makes it harder for your body to protect itself from foodborne illness, also called food poisoning. Foodborne illness is caused by eating food that contains harmful bacteria, parasites, or viruses.  Foods to avoid Some foods have a higher risk of becoming tainted with bacteria. These include: [place] Kitchen Unwashed fresh fruit and vegetables, especially leafy vegetables that can hide dirt and other contaminants . Raw sprouts, such as alfalfa  sprouts . Raw or undercooked beef, especially ground beef, or other raw or undercooked meat and poultry . Fatty, fried, or spicy foods immediately before or after treatment.  These can sit heavy on your stomach and make you feel nauseous. . Raw or undercooked shellfish, such as oysters. . Sushi and sashimi, which often contain raw fish.  . Unpasteurized beverages, such as  unpasteurized fruit juices, raw milk, raw yogurt, or cider . Undercooked eggs, such as soft boiled, over easy, and poached; raw, unpasteurized eggs; or foods made with raw egg, such as homemade raw cookie dough and homemade mayonnaise Simple steps for food safety Shop smart. . Do not buy food stored or displayed in an unclean area. . Do not buy bruised or damaged fruits or vegetables. . Do not buy cans that have cracks, dents, or bulges. . Pick up foods that can spoil at the end of your shopping trip and store them in a cooler on the way home. Prepare and clean up foods carefully. . Rinse all fresh fruits and vegetables under running water, and dry them with a clean towel or paper towel. . Clean the top of cans before opening them. . After preparing food, wash your hands for 20 seconds with hot water and soap. Pay special attention to areas between fingers and under nails. . Clean your utensils and dishes with hot water and soap. Marland Kitchen Disinfect your kitchen and cutting boards using 1 teaspoon of liquid, unscented bleach mixed into 1 quart of water.   Dispose of old food. . Eat canned and packaged food before its expiration date (the "use by" or "best before" date). . Consume refrigerated leftovers within 3 to 4 days. After that time, throw out the food. Even if the food does not smell or look spoiled, it still may be unsafe. Some bacteria, such as Listeria, can grow even on foods stored in the refrigerator if they are kept for too long. Take precautions when eating out. . At restaurants, avoid buffets and salad bars where food  sits out for a long time and comes in contact with many people. Food can become contaminated when someone with a virus, often a norovirus, or another "bug" handles it. . Put any leftover food in a "to-go" container yourself, rather than having the server do it. And, refrigerate leftovers as soon as you get home. . Choose restaurants that are clean and that are willing to prepare your food as you order it cooked.   MEDICATIONS:                                                                                                                                                                Compazine/Prochlorperazine 10mg  tablet. Take 1 tablet every 6 hours as needed for nausea/vomiting. (This can make you sleepy)   EMLA cream. Apply a quarter size amount to port site 1 hour prior to chemo. Do not rub in. Cover with plastic wrap.   Over-the-Counter Meds:  Colace - 100 mg capsules - take 2 capsules daily.  If this doesn't help then you can increase to 2 capsules twice daily.  Call us if this does not  help your bowels move.   Imodium 2mg  capsule. Take 2 capsules after the 1st loose stool and then 1 capsule every 2 hours until you go a total of 12 hours without having a loose stool. Call the [facility] if loose stools continue. If diarrhea occurs at bedtime, take 2 capsules at bedtime. Then take 2 capsules every 4 hours until morning. Call [facility].    Diarrhea Sheet   If you are having loose stools/diarrhea, please purchase Imodium and begin taking as outlined:  At the first sign of poorly formed or loose stools you should begin taking Imodium (loperamide) 2 mg capsules.  Take two caplets (4mg ) followed by one caplet (2mg ) every 2 hours until you have had no diarrhea for 12 hours.  During the night take two caplets (4mg ) at bedtime and continue every 4 hours during the night until the morning.  Stop taking Imodium only after there is no sign of diarrhea for 12 hours.    Always call the  [facility] if you are having loose stools/diarrhea that you can't get under control.  Loose stools/diarrhea leads to dehydration (loss of water) in your body.  We have other options of trying to get the loose stools/diarrhea to stop but you must let us know!   Constipation Sheet  Colace - 100 mg capsules - take 2 capsules daily.  If this doesn't help then you can increase to 2 capsules twice daily.  Please call if the above does not work for you.   Do not go more than 2 days without a bowel movement.  It is very important that you do not become constipated.  It will make you feel sick to your stomach (nausea) and can cause abdominal pain and vomiting.   Nausea Sheet   Compazine/Prochlorperazine 10mg  tablet. Take 1 tablet every 6 hours as needed for nausea/vomiting. (This can make you sleepy)  If you are having persistent nausea (nausea that does not stop) please call the [facility] and let us know the amount of nausea that you are experiencing.  If you begin to vomit, you need to call the [facility] and if it is the weekend and you have vomited more than one time and can't get it to stop-go to the Emergency Room.  Persistent nausea/vomiting can lead to dehydration (loss of fluid in your body) and will make you feel terrible.   Ice chips, sips of clear liquids, foods that are @ room temperature, crackers, and toast tend to be better tolerated.   SYMPTOMS TO REPORT AS SOON AS POSSIBLE AFTER TREATMENT:   FEVER GREATER THAN 100.5 F  CHILLS WITH OR WITHOUT FEVER  NAUSEA AND VOMITING THAT IS NOT CONTROLLED WITH YOUR NAUSEA MEDICATION  UNUSUAL SHORTNESS OF BREATH  UNUSUAL BRUISING OR BLEEDING  TENDERNESS IN MOUTH AND THROAT WITH OR WITHOUT PRESENCE OF ULCERS  URINARY PROBLEMS  BOWEL PROBLEMS  UNUSUAL RASH      Wear comfortable clothing and clothing appropriate for easy access to any Portacath or PICC line. Let us know if there is anything that we can do to make your  therapy better!    What to do if you need assistance after hours or on the weekends: CALL [phone].  HOLD on the line, do not hang up.  You will hear multiple messages but at the end you will be connected with a nurse triage line.  They will contact the doctor if necessary.  Most of the time they will be able to  assist you.  Do not call the hospital operator.      I have been informed and understand all of the instructions given to me and have received a copy. I have been instructed to call the clinic [phone] or my family physician as soon as possible for continued medical care, if indicated. I do not have any more questions at this time but understand that I may call the [facility] or the Patient Navigator at [phone] during office hours should I have questions or need assistance in obtaining follow-up care.

## 2018-01-18 ENCOUNTER — Inpatient Hospital Stay (HOSPITAL_COMMUNITY): Payer: Medicare Other

## 2018-01-18 ENCOUNTER — Encounter (HOSPITAL_COMMUNITY): Payer: Self-pay

## 2018-01-18 VITALS — BP 116/65 | HR 54 | Temp 97.7°F | Resp 16 | Wt 139.0 lb

## 2018-01-18 DIAGNOSIS — Z510 Encounter for antineoplastic radiation therapy: Secondary | ICD-10-CM | POA: Diagnosis not present

## 2018-01-18 DIAGNOSIS — Z9221 Personal history of antineoplastic chemotherapy: Secondary | ICD-10-CM | POA: Diagnosis not present

## 2018-01-18 DIAGNOSIS — C801 Malignant (primary) neoplasm, unspecified: Secondary | ICD-10-CM

## 2018-01-18 DIAGNOSIS — C3431 Malignant neoplasm of lower lobe, right bronchus or lung: Secondary | ICD-10-CM | POA: Diagnosis not present

## 2018-01-18 DIAGNOSIS — Z808 Family history of malignant neoplasm of other organs or systems: Secondary | ICD-10-CM | POA: Diagnosis not present

## 2018-01-18 DIAGNOSIS — Z800 Family history of malignant neoplasm of digestive organs: Secondary | ICD-10-CM | POA: Diagnosis not present

## 2018-01-18 DIAGNOSIS — C3490 Malignant neoplasm of unspecified part of unspecified bronchus or lung: Secondary | ICD-10-CM

## 2018-01-18 DIAGNOSIS — C3401 Malignant neoplasm of right main bronchus: Secondary | ICD-10-CM | POA: Diagnosis not present

## 2018-01-18 DIAGNOSIS — Z720 Tobacco use: Secondary | ICD-10-CM | POA: Diagnosis not present

## 2018-01-18 DIAGNOSIS — Z5111 Encounter for antineoplastic chemotherapy: Secondary | ICD-10-CM | POA: Diagnosis not present

## 2018-01-18 LAB — CBC WITH DIFFERENTIAL/PLATELET
ABS IMMATURE GRANULOCYTES: 0.03 10*3/uL (ref 0.00–0.07)
BASOS ABS: 0.1 10*3/uL (ref 0.0–0.1)
Basophils Relative: 1 %
Eosinophils Absolute: 0.3 10*3/uL (ref 0.0–0.5)
Eosinophils Relative: 3 %
HEMATOCRIT: 45.4 % (ref 39.0–52.0)
HEMOGLOBIN: 14.7 g/dL (ref 13.0–17.0)
Immature Granulocytes: 0 %
LYMPHS ABS: 2.9 10*3/uL (ref 0.7–4.0)
LYMPHS PCT: 31 %
MCH: 31.3 pg (ref 26.0–34.0)
MCHC: 32.4 g/dL (ref 30.0–36.0)
MCV: 96.6 fL (ref 80.0–100.0)
MONOS PCT: 9 %
Monocytes Absolute: 0.8 10*3/uL (ref 0.1–1.0)
NRBC: 0 % (ref 0.0–0.2)
Neutro Abs: 5.3 10*3/uL (ref 1.7–7.7)
Neutrophils Relative %: 56 %
Platelets: 209 10*3/uL (ref 150–400)
RBC: 4.7 MIL/uL (ref 4.22–5.81)
RDW: 12.8 % (ref 11.5–15.5)
WBC: 9.5 10*3/uL (ref 4.0–10.5)

## 2018-01-18 LAB — COMPREHENSIVE METABOLIC PANEL
ALBUMIN: 3.8 g/dL (ref 3.5–5.0)
ALK PHOS: 62 U/L (ref 38–126)
ALT: 18 U/L (ref 0–44)
AST: 18 U/L (ref 15–41)
Anion gap: 5 (ref 5–15)
BILIRUBIN TOTAL: 0.5 mg/dL (ref 0.3–1.2)
BUN: 17 mg/dL (ref 8–23)
CALCIUM: 9.2 mg/dL (ref 8.9–10.3)
CO2: 31 mmol/L (ref 22–32)
Chloride: 103 mmol/L (ref 98–111)
Creatinine, Ser: 1.06 mg/dL (ref 0.61–1.24)
GFR calc Af Amer: >60 mL/min (ref >60)
GFR calc non Af Amer: >60 mL/min (ref >60)
GLUCOSE: 107 mg/dL — AB (ref 70–99)
POTASSIUM: 4.3 mmol/L (ref 3.5–5.1)
Sodium: 139 mmol/L (ref 135–145)
TOTAL PROTEIN: 7.2 g/dL (ref 6.5–8.1)

## 2018-01-18 MED ORDER — PALONOSETRON HCL INJECTION 0.25 MG/5ML
0.2500 mg | Freq: Once | INTRAVENOUS | Status: AC
  Administered 2018-01-18: 0.25 mg via INTRAVENOUS

## 2018-01-18 MED ORDER — SODIUM CHLORIDE 0.9% FLUSH: 10.0000 mL | INTRAVENOUS | Status: DC | PRN

## 2018-01-18 MED ORDER — SODIUM CHLORIDE 0.9 % IV SOLN
90.0000 mg/m2 | Freq: Once | INTRAVENOUS | Status: AC
  Administered 2018-01-18: 160 mg via INTRAVENOUS
  Filled 2018-01-18: qty 8

## 2018-01-18 MED ORDER — SODIUM CHLORIDE 0.9 % IV SOLN
Freq: Once | INTRAVENOUS | Status: AC
  Administered 2018-01-18: 10:00:00 via INTRAVENOUS

## 2018-01-18 MED ORDER — SODIUM CHLORIDE 0.9 % IV SOLN
300.0000 mg | Freq: Once | INTRAVENOUS | Status: AC
  Administered 2018-01-18: 300 mg via INTRAVENOUS
  Filled 2018-01-18: qty 30

## 2018-01-18 MED ORDER — PALONOSETRON HCL INJECTION 0.25 MG/5ML
INTRAVENOUS | Status: AC
  Filled 2018-01-18: qty 5

## 2018-01-18 MED ORDER — SODIUM CHLORIDE 0.9 % IV SOLN
Freq: Once | INTRAVENOUS | Status: AC
  Administered 2018-01-18: 11:00:00 via INTRAVENOUS
  Filled 2018-01-18: qty 5

## 2018-01-18 NOTE — Progress Notes (Signed)
Patient to treatment area for day one chemotherapy. Reviewed information with all questions asked and answered.  Written information given.  Consent signed.  Family at side. No s/s of distress noted.    Patient tolerated treatment with no complaints voiced.  Port site clean and dry.  Dressing intact with no bruising or swelling noted at site.  VSs with discharge and left ambulatory with family with no s/s of distress noted.

## 2018-01-18 NOTE — Patient Instructions (Signed)
[facility] Discharge Instructions for Patients Receiving Chemotherapy  Today you received the following chemotherapy agents etoposide and carboplatin.     If you develop nausea and vomiting that is not controlled by your nausea medication, call the clinic.   BELOW ARE SYMPTOMS THAT SHOULD BE REPORTED IMMEDIATELY:  *FEVER GREATER THAN 100.5 F  *CHILLS WITH OR WITHOUT FEVER  NAUSEA AND VOMITING THAT IS NOT CONTROLLED WITH YOUR NAUSEA MEDICATION  *UNUSUAL SHORTNESS OF BREATH  *UNUSUAL BRUISING OR BLEEDING  TENDERNESS IN MOUTH AND THROAT WITH OR WITHOUT PRESENCE OF ULCERS  *URINARY PROBLEMS  *BOWEL PROBLEMS  UNUSUAL RASH Items with * indicate a potential emergency and should be followed up as soon as possible.  Feel free to call the clinic should you have any questions or concerns. The clinic phone number is [phone].  Please show the [facility] at check-in to the Emergency Department and triage nurse.

## 2018-01-19 ENCOUNTER — Ambulatory Visit (HOSPITAL_COMMUNITY): Payer: Medicare Other

## 2018-01-19 ENCOUNTER — Inpatient Hospital Stay (HOSPITAL_COMMUNITY): Payer: Medicare Other

## 2018-01-19 ENCOUNTER — Encounter (HOSPITAL_COMMUNITY): Payer: Self-pay

## 2018-01-19 VITALS — BP 117/57 | HR 52 | Temp 97.6°F | Resp 16 | Wt 142.4 lb

## 2018-01-19 DIAGNOSIS — Z720 Tobacco use: Secondary | ICD-10-CM | POA: Diagnosis not present

## 2018-01-19 DIAGNOSIS — Z808 Family history of malignant neoplasm of other organs or systems: Secondary | ICD-10-CM | POA: Diagnosis not present

## 2018-01-19 DIAGNOSIS — C3490 Malignant neoplasm of unspecified part of unspecified bronchus or lung: Secondary | ICD-10-CM

## 2018-01-19 DIAGNOSIS — C3431 Malignant neoplasm of lower lobe, right bronchus or lung: Secondary | ICD-10-CM | POA: Diagnosis not present

## 2018-01-19 DIAGNOSIS — Z9221 Personal history of antineoplastic chemotherapy: Secondary | ICD-10-CM | POA: Diagnosis not present

## 2018-01-19 DIAGNOSIS — Z510 Encounter for antineoplastic radiation therapy: Secondary | ICD-10-CM | POA: Diagnosis not present

## 2018-01-19 DIAGNOSIS — Z5111 Encounter for antineoplastic chemotherapy: Secondary | ICD-10-CM | POA: Diagnosis not present

## 2018-01-19 DIAGNOSIS — C3401 Malignant neoplasm of right main bronchus: Secondary | ICD-10-CM | POA: Diagnosis not present

## 2018-01-19 DIAGNOSIS — Z800 Family history of malignant neoplasm of digestive organs: Secondary | ICD-10-CM | POA: Diagnosis not present

## 2018-01-19 MED ORDER — SODIUM CHLORIDE 0.9 % IV SOLN
10.0000 mg | Freq: Once | INTRAVENOUS | Status: AC
  Administered 2018-01-19: 10 mg via INTRAVENOUS
  Filled 2018-01-19: qty 1

## 2018-01-19 MED ORDER — SODIUM CHLORIDE 0.9 % IV SOLN
100.0000 mg/m2 | Freq: Once | INTRAVENOUS | Status: AC
  Administered 2018-01-19: 170 mg via INTRAVENOUS
  Filled 2018-01-19: qty 8.5

## 2018-01-19 MED ORDER — SODIUM CHLORIDE 0.9 % IV SOLN
Freq: Once | INTRAVENOUS | Status: AC
  Administered 2018-01-19: 13:00:00 via INTRAVENOUS

## 2018-01-19 MED ORDER — SODIUM CHLORIDE 0.9% FLUSH
10.0000 mL | INTRAVENOUS | Status: DC | PRN
  Administered 2018-01-19: 10 mL
  Filled 2018-01-19: qty 10

## 2018-01-19 NOTE — Progress Notes (Signed)
Patient tolerated treatment with no complaints voiced.  Port site clean and dry with good blood return noted at end of treatment.  Dressing clean and dry with no bruising or swelling noted at site.  VSS with discharge and left ambulatory with no s/s of distress noted.

## 2018-01-19 NOTE — Patient Instructions (Signed)
[facility] Discharge Instructions for Patients Receiving Chemotherapy  Today you received the following chemotherapy agents etoposide.    If you develop nausea and vomiting that is not controlled by your nausea medication, call the clinic.   BELOW ARE SYMPTOMS THAT SHOULD BE REPORTED IMMEDIATELY:  *FEVER GREATER THAN 100.5 F  *CHILLS WITH OR WITHOUT FEVER  NAUSEA AND VOMITING THAT IS NOT CONTROLLED WITH YOUR NAUSEA MEDICATION  *UNUSUAL SHORTNESS OF BREATH  *UNUSUAL BRUISING OR BLEEDING  TENDERNESS IN MOUTH AND THROAT WITH OR WITHOUT PRESENCE OF ULCERS  *URINARY PROBLEMS  *BOWEL PROBLEMS  UNUSUAL RASH Items with * indicate a potential emergency and should be followed up as soon as possible.  Feel free to call the clinic should you have any questions or concerns. The clinic phone number is [phone].  Please show the [facility] at check-in to the Emergency Department and triage nurse.

## 2018-01-20 ENCOUNTER — Inpatient Hospital Stay (HOSPITAL_COMMUNITY): Payer: Medicare Other

## 2018-01-20 ENCOUNTER — Encounter (HOSPITAL_COMMUNITY): Payer: Self-pay

## 2018-01-20 VITALS — BP 130/62 | HR 55 | Temp 97.8°F | Resp 18 | Wt 143.8 lb

## 2018-01-20 DIAGNOSIS — Z9221 Personal history of antineoplastic chemotherapy: Secondary | ICD-10-CM | POA: Diagnosis not present

## 2018-01-20 DIAGNOSIS — C3490 Malignant neoplasm of unspecified part of unspecified bronchus or lung: Secondary | ICD-10-CM

## 2018-01-20 DIAGNOSIS — C3401 Malignant neoplasm of right main bronchus: Secondary | ICD-10-CM | POA: Diagnosis not present

## 2018-01-20 DIAGNOSIS — Z720 Tobacco use: Secondary | ICD-10-CM | POA: Diagnosis not present

## 2018-01-20 DIAGNOSIS — C3431 Malignant neoplasm of lower lobe, right bronchus or lung: Secondary | ICD-10-CM | POA: Diagnosis not present

## 2018-01-20 DIAGNOSIS — Z5111 Encounter for antineoplastic chemotherapy: Secondary | ICD-10-CM | POA: Diagnosis not present

## 2018-01-20 DIAGNOSIS — Z800 Family history of malignant neoplasm of digestive organs: Secondary | ICD-10-CM | POA: Diagnosis not present

## 2018-01-20 DIAGNOSIS — Z510 Encounter for antineoplastic radiation therapy: Secondary | ICD-10-CM | POA: Diagnosis not present

## 2018-01-20 DIAGNOSIS — Z808 Family history of malignant neoplasm of other organs or systems: Secondary | ICD-10-CM | POA: Diagnosis not present

## 2018-01-20 MED ORDER — SODIUM CHLORIDE 0.9 % IV SOLN
10.0000 mg | Freq: Once | INTRAVENOUS | Status: AC
  Administered 2018-01-20: 10 mg via INTRAVENOUS
  Filled 2018-01-20: qty 1

## 2018-01-20 MED ORDER — SODIUM CHLORIDE 0.9% FLUSH
10.0000 mL | INTRAVENOUS | Status: DC | PRN
  Administered 2018-01-20: 10 mL
  Filled 2018-01-20: qty 10

## 2018-01-20 MED ORDER — SODIUM CHLORIDE 0.9 % IV SOLN
Freq: Once | INTRAVENOUS | Status: AC
  Administered 2018-01-20: 09:00:00 via INTRAVENOUS

## 2018-01-20 MED ORDER — SODIUM CHLORIDE 0.9 % IV SOLN
100.0000 mg/m2 | Freq: Once | INTRAVENOUS | Status: AC
  Administered 2018-01-20: 170 mg via INTRAVENOUS
  Filled 2018-01-20: qty 8.5

## 2018-01-20 MED ORDER — HEPARIN SOD (PORK) LOCK FLUSH 100 UNIT/ML IV SOLN
500.0000 [IU] | Freq: Once | INTRAVENOUS | Status: AC | PRN
  Administered 2018-01-20: 500 [IU]

## 2018-01-20 NOTE — Patient Instructions (Signed)
[facility] Discharge Instructions for Patients Receiving Chemotherapy  Today you received the following chemotherapy agents  If you develop nausea and vomiting that is not controlled by your nausea medication, call the clinic.   BELOW ARE SYMPTOMS THAT SHOULD BE REPORTED IMMEDIATELY:  *FEVER GREATER THAN 100.5 F  *CHILLS WITH OR WITHOUT FEVER  NAUSEA AND VOMITING THAT IS NOT CONTROLLED WITH YOUR NAUSEA MEDICATION  *UNUSUAL SHORTNESS OF BREATH  *UNUSUAL BRUISING OR BLEEDING  TENDERNESS IN MOUTH AND THROAT WITH OR WITHOUT PRESENCE OF ULCERS  *URINARY PROBLEMS  *BOWEL PROBLEMS  UNUSUAL RASH Items with * indicate a potential emergency and should be followed up as soon as possible.  Feel free to call the clinic should you have any questions or concerns. The clinic phone number is [phone].  Please show the CHEMO ALERT CARD at check-in to the Emergency Department and triage nurse.   

## 2018-01-20 NOTE — Progress Notes (Signed)
Patient tolerated treatment with no complaints voiced.  Port site clean and dry with good blood return noted before and after administration of therapy.  No bruising or swelling noted at site.  Band aid applied.  VSS with discharge and left ambulatory with no s/s of distress noted.

## 2018-01-20 NOTE — Progress Notes (Signed)
Nutrition Follow-up:  Patient with lung cancer.  Started chemotherapy of carboplatin and etoposide on 11/20 along with radiation.    Met with patient and daughter during infusion today.  Daughter reports patient with better appetite over the last 2-3 days, better outlook as well. Likes the ensure enlive and has been drinking 1-2 per day.  Reports yesterday did not eat breakfast before coming to treatment.  Ate ice cream after treatment. Last night ate fried chicken, cream style corn, cornbread.  Later in the evening ate ice cream and drank 1 ensure yesterday.  Reports some issues with constipation.  Typically takes mag citrate.      Medications: reviewed, taking chewable MVI  Labs: reviewed  Anthropometrics:   Weight 143 lb 12.8 oz increased (today) from 138 lb on 11/15.  160s in July   NUTRITION DIAGNOSIS: Malnutrition improving   MALNUTRITION DIAGNOSIS: severe malnutrition improving   INTERVENTION:  Encouraged small frequent meals Liberalize diet as long as does not make GI issues worse.  Continue ensure enlive at least 2 daily.  Daughter is going to make samples of shakes this weekend with ensure for patient to try.  Encouraged packing snacks, supplements on treatment days and doctor appointment days. Encouraged am meal for added calories.  Continue MVI    MONITORING, EVALUATION, GOAL: weight trends, intake   NEXT VISIT: December 13 during infusion  Joli B. Allen, RD, LDN Registered Dietitian [phone] (pager)     

## 2018-01-23 DIAGNOSIS — Z9221 Personal history of antineoplastic chemotherapy: Secondary | ICD-10-CM | POA: Diagnosis not present

## 2018-01-23 DIAGNOSIS — Z510 Encounter for antineoplastic radiation therapy: Secondary | ICD-10-CM | POA: Diagnosis not present

## 2018-01-23 DIAGNOSIS — C3401 Malignant neoplasm of right main bronchus: Secondary | ICD-10-CM | POA: Diagnosis not present

## 2018-01-24 ENCOUNTER — Other Ambulatory Visit: Payer: Self-pay

## 2018-01-24 DIAGNOSIS — C3431 Malignant neoplasm of lower lobe, right bronchus or lung: Secondary | ICD-10-CM | POA: Diagnosis not present

## 2018-01-24 DIAGNOSIS — C3401 Malignant neoplasm of right main bronchus: Secondary | ICD-10-CM | POA: Diagnosis not present

## 2018-01-24 DIAGNOSIS — Z510 Encounter for antineoplastic radiation therapy: Secondary | ICD-10-CM | POA: Diagnosis not present

## 2018-01-24 DIAGNOSIS — Z9221 Personal history of antineoplastic chemotherapy: Secondary | ICD-10-CM | POA: Diagnosis not present

## 2018-01-24 NOTE — Patient Outreach (Signed)
[facility]) Care Management  01/24/2018  CORRELL DENBOW January 28, 1941 [id number]   Medication Adherence call to Mr. Mrk Buzby spoke with patient he is due on Metformin ER 500 mg patient is no longer taking this medication doctor told him not to take it any longer two month ago. Mr. Tangonan is showing past due under [place].   [facility] Management Direct Dial [phone]  Fax [phone] [email or web address]

## 2018-01-25 ENCOUNTER — Other Ambulatory Visit (HOSPITAL_COMMUNITY): Payer: Self-pay | Admitting: *Deleted

## 2018-01-25 DIAGNOSIS — C3490 Malignant neoplasm of unspecified part of unspecified bronchus or lung: Secondary | ICD-10-CM

## 2018-01-25 DIAGNOSIS — Z510 Encounter for antineoplastic radiation therapy: Secondary | ICD-10-CM | POA: Diagnosis not present

## 2018-01-25 DIAGNOSIS — C3401 Malignant neoplasm of right main bronchus: Secondary | ICD-10-CM | POA: Diagnosis not present

## 2018-01-25 DIAGNOSIS — Z9221 Personal history of antineoplastic chemotherapy: Secondary | ICD-10-CM | POA: Diagnosis not present

## 2018-01-25 MED ORDER — LORAZEPAM 0.5 MG PO TABS: 0.5000 mg | ORAL_TABLET | Freq: Two times a day (BID) | ORAL | 1 refills | Status: DC | PRN

## 2018-01-30 ENCOUNTER — Ambulatory Visit (HOSPITAL_COMMUNITY): Payer: Medicare Other | Admitting: Hematology

## 2018-01-30 DIAGNOSIS — Z510 Encounter for antineoplastic radiation therapy: Secondary | ICD-10-CM | POA: Diagnosis not present

## 2018-01-30 DIAGNOSIS — Z9221 Personal history of antineoplastic chemotherapy: Secondary | ICD-10-CM | POA: Diagnosis not present

## 2018-01-30 DIAGNOSIS — C3401 Malignant neoplasm of right main bronchus: Secondary | ICD-10-CM | POA: Diagnosis not present

## 2018-01-31 DIAGNOSIS — Z510 Encounter for antineoplastic radiation therapy: Secondary | ICD-10-CM | POA: Diagnosis not present

## 2018-01-31 DIAGNOSIS — Z9221 Personal history of antineoplastic chemotherapy: Secondary | ICD-10-CM | POA: Diagnosis not present

## 2018-01-31 DIAGNOSIS — C3401 Malignant neoplasm of right main bronchus: Secondary | ICD-10-CM | POA: Diagnosis not present

## 2018-02-01 ENCOUNTER — Ambulatory Visit (HOSPITAL_COMMUNITY): Payer: Medicare Other | Admitting: Hematology

## 2018-02-01 ENCOUNTER — Other Ambulatory Visit (HOSPITAL_COMMUNITY): Payer: Medicare Other

## 2018-02-01 DIAGNOSIS — Z510 Encounter for antineoplastic radiation therapy: Secondary | ICD-10-CM | POA: Diagnosis not present

## 2018-02-01 DIAGNOSIS — Z9221 Personal history of antineoplastic chemotherapy: Secondary | ICD-10-CM | POA: Diagnosis not present

## 2018-02-01 DIAGNOSIS — C3401 Malignant neoplasm of right main bronchus: Secondary | ICD-10-CM | POA: Diagnosis not present

## 2018-02-02 DIAGNOSIS — Z510 Encounter for antineoplastic radiation therapy: Secondary | ICD-10-CM | POA: Diagnosis not present

## 2018-02-02 DIAGNOSIS — C3401 Malignant neoplasm of right main bronchus: Secondary | ICD-10-CM | POA: Diagnosis not present

## 2018-02-02 DIAGNOSIS — C3431 Malignant neoplasm of lower lobe, right bronchus or lung: Secondary | ICD-10-CM | POA: Diagnosis not present

## 2018-02-02 DIAGNOSIS — Z9221 Personal history of antineoplastic chemotherapy: Secondary | ICD-10-CM | POA: Diagnosis not present

## 2018-02-03 DIAGNOSIS — Z9221 Personal history of antineoplastic chemotherapy: Secondary | ICD-10-CM | POA: Diagnosis not present

## 2018-02-03 DIAGNOSIS — Z510 Encounter for antineoplastic radiation therapy: Secondary | ICD-10-CM | POA: Diagnosis not present

## 2018-02-03 DIAGNOSIS — C3401 Malignant neoplasm of right main bronchus: Secondary | ICD-10-CM | POA: Diagnosis not present

## 2018-02-06 DIAGNOSIS — Z9221 Personal history of antineoplastic chemotherapy: Secondary | ICD-10-CM | POA: Diagnosis not present

## 2018-02-06 DIAGNOSIS — Z510 Encounter for antineoplastic radiation therapy: Secondary | ICD-10-CM | POA: Diagnosis not present

## 2018-02-06 DIAGNOSIS — C3401 Malignant neoplasm of right main bronchus: Secondary | ICD-10-CM | POA: Diagnosis not present

## 2018-02-07 ENCOUNTER — Inpatient Hospital Stay (HOSPITAL_COMMUNITY): Payer: Medicare Other | Attending: Hematology

## 2018-02-07 DIAGNOSIS — F329 Major depressive disorder, single episode, unspecified: Secondary | ICD-10-CM | POA: Diagnosis not present

## 2018-02-07 DIAGNOSIS — Z5111 Encounter for antineoplastic chemotherapy: Secondary | ICD-10-CM | POA: Diagnosis not present

## 2018-02-07 DIAGNOSIS — C3431 Malignant neoplasm of lower lobe, right bronchus or lung: Secondary | ICD-10-CM | POA: Diagnosis not present

## 2018-02-07 DIAGNOSIS — Z9221 Personal history of antineoplastic chemotherapy: Secondary | ICD-10-CM | POA: Diagnosis not present

## 2018-02-07 DIAGNOSIS — R197 Diarrhea, unspecified: Secondary | ICD-10-CM | POA: Diagnosis not present

## 2018-02-07 DIAGNOSIS — Z79899 Other long term (current) drug therapy: Secondary | ICD-10-CM | POA: Insufficient documentation

## 2018-02-07 DIAGNOSIS — Z720 Tobacco use: Secondary | ICD-10-CM | POA: Insufficient documentation

## 2018-02-07 DIAGNOSIS — Z510 Encounter for antineoplastic radiation therapy: Secondary | ICD-10-CM | POA: Diagnosis not present

## 2018-02-07 DIAGNOSIS — C3401 Malignant neoplasm of right main bronchus: Secondary | ICD-10-CM | POA: Diagnosis not present

## 2018-02-07 DIAGNOSIS — C3490 Malignant neoplasm of unspecified part of unspecified bronchus or lung: Secondary | ICD-10-CM

## 2018-02-07 LAB — CBC WITH DIFFERENTIAL/PLATELET
Abs Immature Granulocytes: 0.04 10*3/uL (ref 0.00–0.07)
BASOS PCT: 1 %
Basophils Absolute: 0 10*3/uL (ref 0.0–0.1)
EOS ABS: 0.1 10*3/uL (ref 0.0–0.5)
Eosinophils Relative: 3 %
HCT: 36.9 % — ABNORMAL LOW (ref 39.0–52.0)
Hemoglobin: 12.4 g/dL — ABNORMAL LOW (ref 13.0–17.0)
Immature Granulocytes: 1 %
Lymphocytes Relative: 32 %
Lymphs Abs: 1.2 10*3/uL (ref 0.7–4.0)
MCH: 32.1 pg (ref 26.0–34.0)
MCHC: 33.6 g/dL (ref 30.0–36.0)
MCV: 95.6 fL (ref 80.0–100.0)
MONOS PCT: 18 %
Monocytes Absolute: 0.7 10*3/uL (ref 0.1–1.0)
NEUTROS PCT: 45 %
NRBC: 0 % (ref 0.0–0.2)
Neutro Abs: 1.7 10*3/uL (ref 1.7–7.7)
PLATELETS: 126 10*3/uL — AB (ref 150–400)
RBC: 3.86 MIL/uL — ABNORMAL LOW (ref 4.22–5.81)
RDW: 13.5 % (ref 11.5–15.5)
WBC: 3.7 10*3/uL — ABNORMAL LOW (ref 4.0–10.5)

## 2018-02-07 LAB — COMPREHENSIVE METABOLIC PANEL
ALT: 26 U/L (ref 0–44)
ANION GAP: 9 (ref 5–15)
AST: 23 U/L (ref 15–41)
Albumin: 3.7 g/dL (ref 3.5–5.0)
Alkaline Phosphatase: 71 U/L (ref 38–126)
BUN: 17 mg/dL (ref 8–23)
CO2: 26 mmol/L (ref 22–32)
Calcium: 8.8 mg/dL — ABNORMAL LOW (ref 8.9–10.3)
Chloride: 105 mmol/L (ref 98–111)
Creatinine, Ser: 0.86 mg/dL (ref 0.61–1.24)
Glucose, Bld: 114 mg/dL — ABNORMAL HIGH (ref 70–99)
POTASSIUM: 3.7 mmol/L (ref 3.5–5.1)
Sodium: 140 mmol/L (ref 135–145)
Total Bilirubin: 0.7 mg/dL (ref 0.3–1.2)
Total Protein: 7.1 g/dL (ref 6.5–8.1)

## 2018-02-08 ENCOUNTER — Encounter (HOSPITAL_COMMUNITY): Payer: Self-pay | Admitting: Hematology

## 2018-02-08 ENCOUNTER — Other Ambulatory Visit (HOSPITAL_COMMUNITY): Payer: Medicare Other

## 2018-02-08 ENCOUNTER — Inpatient Hospital Stay (HOSPITAL_COMMUNITY): Payer: Medicare Other

## 2018-02-08 ENCOUNTER — Inpatient Hospital Stay (HOSPITAL_BASED_OUTPATIENT_CLINIC_OR_DEPARTMENT_OTHER): Payer: Medicare Other | Admitting: Hematology

## 2018-02-08 VITALS — BP 94/49 | HR 65 | Temp 97.7°F | Resp 18

## 2018-02-08 VITALS — BP 95/61 | HR 83 | Temp 97.9°F | Resp 18 | Wt 138.0 lb

## 2018-02-08 DIAGNOSIS — R197 Diarrhea, unspecified: Secondary | ICD-10-CM | POA: Diagnosis not present

## 2018-02-08 DIAGNOSIS — R634 Abnormal weight loss: Secondary | ICD-10-CM

## 2018-02-08 DIAGNOSIS — Z9221 Personal history of antineoplastic chemotherapy: Secondary | ICD-10-CM | POA: Diagnosis not present

## 2018-02-08 DIAGNOSIS — F329 Major depressive disorder, single episode, unspecified: Secondary | ICD-10-CM

## 2018-02-08 DIAGNOSIS — C3401 Malignant neoplasm of right main bronchus: Secondary | ICD-10-CM | POA: Diagnosis not present

## 2018-02-08 DIAGNOSIS — C3490 Malignant neoplasm of unspecified part of unspecified bronchus or lung: Secondary | ICD-10-CM

## 2018-02-08 DIAGNOSIS — Z5111 Encounter for antineoplastic chemotherapy: Secondary | ICD-10-CM | POA: Diagnosis not present

## 2018-02-08 DIAGNOSIS — C3431 Malignant neoplasm of lower lobe, right bronchus or lung: Secondary | ICD-10-CM

## 2018-02-08 DIAGNOSIS — Z720 Tobacco use: Secondary | ICD-10-CM | POA: Diagnosis not present

## 2018-02-08 DIAGNOSIS — Z510 Encounter for antineoplastic radiation therapy: Secondary | ICD-10-CM | POA: Diagnosis not present

## 2018-02-08 DIAGNOSIS — Z79899 Other long term (current) drug therapy: Secondary | ICD-10-CM | POA: Diagnosis not present

## 2018-02-08 MED ORDER — MIRTAZAPINE 7.5 MG PO TABS: 7.5000 mg | ORAL_TABLET | Freq: Every day | ORAL | 0 refills | Status: DC

## 2018-02-08 MED ORDER — SODIUM CHLORIDE 0.9 % IV SOLN
409.5000 mg | Freq: Once | INTRAVENOUS | Status: AC
  Administered 2018-02-08: 410 mg via INTRAVENOUS
  Filled 2018-02-08: qty 41

## 2018-02-08 MED ORDER — PALONOSETRON HCL INJECTION 0.25 MG/5ML
0.2500 mg | Freq: Once | INTRAVENOUS | Status: AC
  Administered 2018-02-08: 0.25 mg via INTRAVENOUS
  Filled 2018-02-08: qty 5

## 2018-02-08 MED ORDER — SODIUM CHLORIDE 0.9 % IV SOLN
100.0000 mg/m2 | Freq: Once | INTRAVENOUS | Status: AC
  Administered 2018-02-08: 170 mg via INTRAVENOUS
  Filled 2018-02-08: qty 8.5

## 2018-02-08 MED ORDER — SODIUM CHLORIDE 0.9 % IV SOLN
Freq: Once | INTRAVENOUS | Status: AC
  Administered 2018-02-08: 10:00:00 via INTRAVENOUS
  Filled 2018-02-08: qty 5

## 2018-02-08 MED ORDER — SODIUM CHLORIDE 0.9 % IV SOLN
Freq: Once | INTRAVENOUS | Status: AC
  Administered 2018-02-08: 10:00:00 via INTRAVENOUS

## 2018-02-08 NOTE — Assessment & Plan Note (Signed)
1.  Limited stage small cell lung cancer: - He had a recent fall, CT of the abdomen and pelvis on 11/08/2017 showed irregular narrowing of the right lower lobe bronchus with associated right hilar adenopathy. - Subsequent PET CT scan on 11/28/2017 shows lesion at the inferior lobar bronchus with SUV of 5.5 with the right hilar hypermetabolic and ipsilateral mediastinal adenopathy including right paratracheal, subcarinal and several small prevascular lymph nodes which demonstrate only low-grade metabolic activity. - On 68/01/7516 bronchoscopy and EBUS biopsy done by Dr. Roxan Hockey shows small cell carcinoma of the right bronchus biopsy.  Lymph node aspirations were also consistent with small cell lung cancer. - Patient is a current active smoker, smokes 1 pack/day for 60+ years.  He also had a brief exposure to asbestos.  He is a [place] [employer] veteran, was not sure whether he was exposed to agent orange. - He lives by himself and is independent of ADLs and IADLs.  He had 50 pound weight loss since January and a EGD done in [place] was consistent with gastroparesis. - We talked about the normal prognosis of small cell lung cancer.  For patients with limited stage disease, medial survival usually ranges between 15 to 20 months and reported 5-year survival is about 10 to 13%. - I have recommended combination chemoradiation therapy with 4 cycles of carboplatin and etoposide.  I do not think he is a candidate for cisplatin.  If he gets a partial response, he will be offered PCI. - I have discussed the results of the brain MRI dated 01/06/2018 which did not show any metastatic disease. - Cycle 1 chemotherapy with carboplatin and etoposide along with radiation therapy started on 01/18/2018. - He had diarrhea on and off.  Denied any nausea or vomiting.  Fatigue has been stable.  He did have a drop in some blood pressure.  He denies any lightheadedness at this time.  He also complains of feeling depressed. - I have  reviewed his blood counts.  We will proceed with cycle 2 without any dose changes. -For his depression we will start him on Remeron.  2.  Weight loss: -He lost about 5 pounds since cycle 1 was delivered. He is drinking about 2 cans of boost per day.  I have told him to increase it further. - Hopefully Remeron will also help improve his appetite.  He was encouraged to eat solid foods.

## 2018-02-08 NOTE — Progress Notes (Signed)
Tolerated infusion w/o adverse reaction.  Alert, in no distress.  VSS.  Discharged ambulatory in c/o family.

## 2018-02-08 NOTE — Progress Notes (Signed)
[place], [place] [address]   CLINIC:  Medical Oncology/Hematology  PCP:  Rory Percy, MD [place] [address] [phone]   REASON FOR VISIT: Follow-up for limited stage small cell lung  CURRENT THERAPY: chemoradiation   BRIEF ONCOLOGIC HISTORY:    Small cell lung cancer ([place])   12/30/2017 Initial Diagnosis    Small cell lung cancer ([place])    01/16/2018 -  Chemotherapy    The patient had palonosetron (ALOXI) injection 0.25 mg, 0.25 mg, Intravenous,  Once, 2 of 4 cycles Administration: 0.25 mg (01/18/2018), 0.25 mg (02/08/2018) CARBOplatin (PARAPLATIN) 300 mg in sodium chloride 0.9 % 250 mL chemo infusion, 300 mg (100 % of original dose 300 mg), Intravenous,  Once, 2 of 4 cycles Dose modification: 300 mg (original dose 300 mg, Cycle 1), 409.5 mg (original dose 409.5 mg, Cycle 2) Administration: 300 mg (01/18/2018), 410 mg (02/08/2018) etoposide (VEPESID) 160 mg in sodium chloride 0.9 % 500 mL chemo infusion, 90 mg/m2 = 160 mg (90 % of original dose 100 mg/m2), Intravenous,  Once, 2 of 4 cycles Dose modification: 90 mg/m2 (90 % of original dose 100 mg/m2, Cycle 1, Reason: Patient Age) Administration: 160 mg (01/18/2018), 170 mg (01/19/2018), 170 mg (02/08/2018), 170 mg (01/20/2018) fosaprepitant (EMEND) 150 mg, dexamethasone (DECADRON) 12 mg in sodium chloride 0.9 % 145 mL IVPB, , Intravenous,  Once, 2 of 4 cycles Administration:  (01/18/2018),  (02/08/2018)  for chemotherapy treatment.       INTERVAL HISTORY:  Mr. Alverio 77 y.o. male returns for routine follow-up for limited stage small cell lung cancer. He is here today with his daughter. He has been trying to eat more due to loosing 5 pounds. He is eating breakfast and drinking boosts plus daily. He does report having problems with diarrhea. He is having issues with his depression and very emotional lately. He denies pain when swallowing. Denies any fevers or recent  infections. Denies any new pains. Denies any nausea or vomiting. He reports his appetite at 25% and his energy level is 25%. He lives with his daughter and she helps him performs ADLs and cooks for him.     REVIEW OF SYSTEMS:  Review of Systems  Constitutional: Positive for fatigue.  Gastrointestinal: Positive for diarrhea.  All other systems reviewed and are negative.    PAST MEDICAL/SURGICAL HISTORY:  Past Medical History:  Diagnosis Date  . AAA (abdominal aortic aneurysm) ([place])   . Alternating constipation and diarrhea   . Arthritis   . Chronic back pain   . COPD (chronic obstructive pulmonary disease) ([place])   . Depression   . GERD (gastroesophageal reflux disease)   . History of hiatal hernia   . Hypertension    not on medications  . Hypothyroidism   . Lung cancer ([place])   . Peripheral vascular disease ([place])   . Smoker   . Thyroid disease    Past Surgical History:  Procedure Laterality Date  . ABDOMINAL AORTAGRAM N/A 03/14/2012   Procedure: ABDOMINAL Maxcine Ham;  Surgeon: Serafina Mitchell, MD;  Location: [facility] CATH LAB;  Service: Cardiovascular;  Laterality: N/A;  . ABDOMINAL AORTIC ANEURYSM REPAIR  04-22-2009   Stent graft repair of AAA  . APPENDECTOMY    . BRONCHIAL BRUSHINGS Right 11/30/2017   Procedure: BRONCHIAL BRUSHINGS;  Surgeon: Sinda Du, MD;  [facility] ENDO [facility];  Service: Cardiopulmonary;  Laterality: Right;  . BRONCHIAL WASHINGS Right 11/30/2017   Procedure: BRONCHIAL WASHINGS;  Surgeon: Sinda Du, MD;  [facility] END[facility];  Service: Cardiopulmonary;  Laterality: Right;  . BYPASS GRAFT POPLITEAL TO POPLITEAL  03/20/2012   Procedure: BYPASS GRAFT POPLITEAL TO POPLITEAL;  Surgeon: Elam Dutch, MD;  Location: [facility];  Service: Vascular;  Laterality: Left;  ABOVE THE KNEE POPLITEAL ARTERY TO BELOW THE KNEE POPLITEAL ARTERY BYPASS GRAFT USING REVERSE LEFT GREATER SAPHENOUS VEIN   . ENDARTERECTOMY POPLITEAL  03/20/2012   Procedure: ENDARTERECTOMY  POPLITEAL;  Surgeon: Elam Dutch, MD;  Location: [facility] OR;  Service: Vascular;  Laterality: Left;   LIGATION OF LEFT LEG POPLITEAL ANEURYSM  . FLEXIBLE BRONCHOSCOPY N/A 11/30/2017   Procedure: FLEXIBLE BRONCHOSCOPY;  Surgeon: Sinda Du, MD;  [facility];  Service: Cardiopulmonary;  Laterality: N/A;  . INTRAOPERATIVE ARTERIOGRAM  03/20/2012   Procedure: INTRA OPERATIVE ARTERIOGRAM;  Surgeon: Elam Dutch, MD;  Location: [facility];  Service: Vascular;  Laterality: Left;  TIMES ONE  . PORTACATH PLACEMENT Left 01/06/2018   Procedure: INSERTION PORT-A-CATH;  Surgeon: Aviva Signs, MD;  Location: [facility] ORS;  Service: General;  Laterality: Left;  Marland Kitchen VASECTOMY  1982  . VIDEO BRONCHOSCOPY WITH ENDOBRONCHIAL ULTRASOUND N/A 12/23/2017   Procedure: VIDEO BRONCHOSCOPY WITH ENDOBRONCHIAL ULTRASOUND;  Surgeon: Melrose Nakayama, MD;  Location: [facility];  Service: Thoracic;  Laterality: N/A;     SOCIAL HISTORY:  Social History   Socioeconomic History  . Marital status: Divorced    Spouse name: Not on file  . Number of children: 2  . Years of education: Not on file  . Highest education level: Not on file  Occupational History  . Occupation: [place]     Comment: Agent Orange exposure  . Occupation: Sales executive    Comment: [place] exsposure  Social Needs  . Financial resource strain: Not hard at all  . Food insecurity:    Worry: Never true    Inability: Never true  . Transportation needs:    Medical: No    Non-medical: No  Tobacco Use  . Smoking status: Current Every Day Smoker    Years: 62.00    Types: Cigars  . Smokeless tobacco: Never Used  . Tobacco comment: pt states he smokes about 15 cigars per day  Substance and Sexual Activity  . Alcohol use: No  . Drug use: No  . Sexual activity: Yes    Birth control/protection: None  Lifestyle  . Physical activity:    Days per week: 0 days    Minutes per session: 0 min  . Stress: To some extent  Relationships  .  Social connections:    Talks on phone: More than three times a week    Gets together: Once a week    Attends religious service: More than 4 times per year    Active member of club or organization: No    Attends meetings of clubs or organizations: Never    Relationship status: Divorced  . Intimate partner violence:    Fear of current or ex partner: No    Emotionally abused: No    Physically abused: No    Forced sexual activity: No  Other Topics Concern  . Not on file  Social History Narrative   Sedentary    FAMILY HISTORY:  Family History  Problem Relation Age of Onset  . Diabetes Mother   . Stroke Mother   . Heart disease Father   . Diabetes Father   . Heart disease Sister        CABG  x 4  . Cancer Brother        throat cancer  . Diabetes Sister   . Heart disease Sister   . Cancer Brother   . Kidney disease Son     CURRENT MEDICATIONS:  Outpatient Encounter Medications as of 02/08/2018  Medication Sig  . CARBOPLATIN IV Inject into the vein every 21 ( twenty-one) days.   . ETOPOSIDE IV Inject 170 mg into the vein every 21 ( twenty-one) days.   . Ipratropium-Albuterol (COMBIVENT RESPIMAT) 20-100 MCG/ACT AERS respimat Inhale 1 puff into the lungs every 6 (six) hours as needed for wheezing.  Marland Kitchen levothyroxine (SYNTHROID, LEVOTHROID) 137 MCG tablet Take 137 mcg by mouth daily before breakfast.   . lidocaine-prilocaine (EMLA) cream Apply small amount to port site and cover with plastic wrap one hour prior to appointment.  Marland Kitchen LORazepam (ATIVAN) 0.5 MG tablet Take 1 tablet (0.5 mg total) by mouth 2 (two) times daily as needed for anxiety.  Marland Kitchen LORazepam (ATIVAN) 1 MG tablet Take 1 mg by mouth at bedtime.   . metoCLOPramide (REGLAN) 5 MG tablet Take 5 mg by mouth daily before lunch.  Marland Kitchen omeprazole (PRILOSEC) 20 MG capsule Take 20 mg by mouth daily.  Marland Kitchen POTASSIUM PO Take 1 tablet by mouth daily.  . prochlorperazine (COMPAZINE) 10 MG tablet Take 1 tablet (10 mg total) by mouth every 6  (six) hours as needed (Nausea or vomiting).  . sertraline (ZOLOFT) 50 MG tablet Take 50 mg by mouth every morning.   . simvastatin (ZOCOR) 10 MG tablet Take 10 mg by mouth at bedtime.  . traMADol (ULTRAM) 50 MG tablet Take 1 tablet (50 mg total) by mouth every 6 (six) hours as needed.  . mirtazapine (REMERON) 7.5 MG tablet Take 1 tablet (7.5 mg total) by mouth at bedtime.   No facility-administered encounter medications on file as of 02/08/2018.     ALLERGIES:  Allergies  Allergen Reactions  . Morphine And Related Shortness Of Breath and Other (See Comments)    Hallucinations pt has taken hydromorphone before     PHYSICAL EXAM:  ECOG Performance status: 1  Vitals:   02/08/18 0915  BP: 95/61  Pulse: 83  Resp: 18  Temp: 97.9 F (36.6 C)  SpO2: 99%   Filed Weights   02/08/18 0915  Weight: 138 lb (62.6 kg)    Physical Exam  Constitutional: He is oriented to person, place, and time. He appears well-developed and well-nourished.  Cardiovascular: Normal rate, regular rhythm and normal heart sounds.  Pulmonary/Chest: Effort normal and breath sounds normal.  Musculoskeletal: Normal range of motion.  Neurological: He is alert and oriented to person, place, and time.  Skin: Skin is warm and dry.  Psychiatric: He has a normal mood and affect. His behavior is normal. Judgment and thought content normal.     LABORATORY DATA:  I have reviewed the labs as listed.  CBC    Component Value Date/Time   WBC 3.7 (L) 02/07/2018 1034   RBC 3.86 (L) 02/07/2018 1034   HGB 12.4 (L) 02/07/2018 1034   HCT 36.9 (L) 02/07/2018 1034   PLT 126 (L) 02/07/2018 1034   MCV 95.6 02/07/2018 1034   MCH 32.1 02/07/2018 1034   MCHC 33.6 02/07/2018 1034   RDW 13.5 02/07/2018 1034   LYMPHSABS 1.2 02/07/2018 1034   MONOABS 0.7 02/07/2018 1034   EOSABS 0.1 02/07/2018 1034   BASOSABS 0.0 02/07/2018 1034   CMP Latest Ref Rng & Units 02/07/2018 01/18/2018 12/23/2017  Glucose 70 - 99 mg/dL 114(H)  107(H) 102(H)  BUN 8 - 23 mg/dL _0 Creatinine 0.61 - 1.24 mg/dL 0.86 1.06 1.04  Sodium 135 - 145 mmol/L 140 139 138  Potassium 3.5 - 5.1 mmol/L 3.7 4.3 4.1  Chloride 98 - 111 mmol/L 105 103 106  CO2 22 - 32 mmol/L _1 Calcium 8.9 - 10.3 mg/dL 8.8(L) 9.2 9.0  Total Protein 6.5 - 8.1 g/dL 7.1 7.2 6.7  Total Bilirubin 0.3 - 1.2 mg/dL 0.7 0.5 0.7  Alkaline Phos 38 - 126 U/L 71 62 61  AST 15 - 41 U/L _2 ALT 0 - 44 U/L _3 I have reviewed Francene Finders, NP's note and agree with the documentation.  I personally performed a face-to-face visit, made revisions and my assessment and plan is as follows.      ASSESSMENT & PLAN:   Small cell lung cancer ([place]) 1.  Limited stage small cell lung cancer: - He had a recent fall, CT of the abdomen and pelvis on 11/08/2017 showed irregular narrowing of the right lower lobe bronchus with associated right hilar adenopathy. - Subsequent PET CT scan on 11/28/2017 shows lesion at the inferior lobar bronchus with SUV of 5.5 with the right hilar hypermetabolic and ipsilateral mediastinal adenopathy including right paratracheal, subcarinal and several small prevascular lymph nodes which demonstrate only low-grade metabolic activity. - On 71/24/5809 bronchoscopy and EBUS biopsy done by Dr. Roxan Hockey shows small cell carcinoma of the right bronchus biopsy.  Lymph node aspirations were also consistent with small cell lung cancer. - Patient is a current active smoker, smokes 1 pack/day for 60+ years.  He also had a brief exposure to asbestos.  He is a [place] [employer] veteran, was not sure whether he was exposed to agent orange. - He lives by himself and is independent of ADLs and IADLs.  He had 50 pound weight loss since January and a EGD done in [place] was consistent with gastroparesis. - We talked about the normal prognosis of small cell lung cancer.  For patients with limited stage disease, medial survival usually ranges between 15 to 20  months and reported 5-year survival is about 10 to 13%. - I have recommended combination chemoradiation therapy with 4 cycles of carboplatin and etoposide.  I do not think he is a candidate for cisplatin.  If he gets a partial response, he will be offered PCI. - I have discussed the results of the brain MRI dated 01/06/2018 which did not show any metastatic disease. - Cycle 1 chemotherapy with carboplatin and etoposide along with radiation therapy started on 01/18/2018. - He had diarrhea on and off.  Denied any nausea or vomiting.  Fatigue has been stable.  He did have a drop in some blood pressure.  He denies any lightheadedness at this time.  He also complains of feeling depressed. - I have reviewed his blood counts.  We will proceed with cycle 2 without any dose changes. -For his depression we will start him on Remeron.  2.  Weight loss: -He lost about 5 pounds since cycle 1 was delivered. He is drinking about 2 cans of boost per day.  I have told him to increase it further. - Hopefully Remeron will also help improve his appetite.  He was encouraged to eat solid foods.      Orders placed this encounter:  Orders Placed This Encounter  Procedures  . Magnesium  .  CBC with Differential/Platelet  . Comprehensive metabolic panel      Derek Jack, MD [place] [phone]

## 2018-02-08 NOTE — Patient Instructions (Addendum)
[place] at [facility] Discharge Instructions   Follow up in 3 weeks with labs   Thank you for choosing [place] at [facility] to provide your oncology and hematology care.  To afford each patient quality time with our provider, please arrive at least 15 minutes before your scheduled appointment time.   If you have a lab appointment with the [facility] please come in thru the  Main Entrance and check in at the main information desk  You need to re-schedule your appointment should you arrive 10 or more minutes late.  We strive to give you quality time with our providers, and arriving late affects you and other patients whose appointments are after yours.  Also, if you no show three or more times for appointments you may be dismissed from the clinic at the providers discretion.     Again, thank you for choosing [facility].  Our hope is that these requests will decrease the amount of time that you wait before being seen by our physicians.       _____________________________________________________________  Should you have questions after your visit to [facility], please contact our office at [phone] between the hours of 8:00 a.m. and 4:30 p.m.  Voicemails left after 4:00 p.m. will not be returned until the following business day.  For prescription refill requests, have your pharmacy contact our office and allow 72 hours.    Cancer Center Support Programs:   > Cancer Support Group  2nd Tuesday of the month 1pm-2pm, Journey Room

## 2018-02-09 ENCOUNTER — Inpatient Hospital Stay (HOSPITAL_COMMUNITY): Payer: Medicare Other

## 2018-02-09 ENCOUNTER — Encounter (HOSPITAL_COMMUNITY): Payer: Self-pay

## 2018-02-09 VITALS — BP 106/52 | HR 68 | Temp 97.7°F | Resp 18

## 2018-02-09 DIAGNOSIS — R197 Diarrhea, unspecified: Secondary | ICD-10-CM | POA: Diagnosis not present

## 2018-02-09 DIAGNOSIS — Z510 Encounter for antineoplastic radiation therapy: Secondary | ICD-10-CM | POA: Diagnosis not present

## 2018-02-09 DIAGNOSIS — C3401 Malignant neoplasm of right main bronchus: Secondary | ICD-10-CM | POA: Diagnosis not present

## 2018-02-09 DIAGNOSIS — Z9221 Personal history of antineoplastic chemotherapy: Secondary | ICD-10-CM | POA: Diagnosis not present

## 2018-02-09 DIAGNOSIS — C3490 Malignant neoplasm of unspecified part of unspecified bronchus or lung: Secondary | ICD-10-CM

## 2018-02-09 DIAGNOSIS — Z79899 Other long term (current) drug therapy: Secondary | ICD-10-CM | POA: Diagnosis not present

## 2018-02-09 DIAGNOSIS — Z720 Tobacco use: Secondary | ICD-10-CM | POA: Diagnosis not present

## 2018-02-09 DIAGNOSIS — I959 Hypotension, unspecified: Secondary | ICD-10-CM

## 2018-02-09 DIAGNOSIS — C3431 Malignant neoplasm of lower lobe, right bronchus or lung: Secondary | ICD-10-CM | POA: Diagnosis not present

## 2018-02-09 DIAGNOSIS — Z5111 Encounter for antineoplastic chemotherapy: Secondary | ICD-10-CM | POA: Diagnosis not present

## 2018-02-09 MED ORDER — SODIUM CHLORIDE 0.9% FLUSH
10.0000 mL | INTRAVENOUS | Status: DC | PRN
  Administered 2018-02-09: 10 mL
  Filled 2018-02-09: qty 10

## 2018-02-09 MED ORDER — SODIUM CHLORIDE 0.9 % IV SOLN
Freq: Once | INTRAVENOUS | Status: AC
  Administered 2018-02-09: 13:00:00 via INTRAVENOUS

## 2018-02-09 MED ORDER — SODIUM CHLORIDE 0.9 % IV SOLN
Freq: Once | INTRAVENOUS | Status: AC
  Administered 2018-02-09: 11:00:00 via INTRAVENOUS

## 2018-02-09 MED ORDER — SODIUM CHLORIDE 0.9 % IV SOLN
10.0000 mg | Freq: Once | INTRAVENOUS | Status: AC
  Administered 2018-02-09: 10 mg via INTRAVENOUS
  Filled 2018-02-09: qty 1

## 2018-02-09 MED ORDER — SODIUM CHLORIDE 0.9 % IV SOLN
100.0000 mg/m2 | Freq: Once | INTRAVENOUS | Status: AC
  Administered 2018-02-09: 170 mg via INTRAVENOUS
  Filled 2018-02-09: qty 8.5

## 2018-02-09 NOTE — Progress Notes (Signed)
Reviewed patients blood pressures with Dr. Delton Coombes with verbal order one liter of saline over two hours.  Patient stated fatigue but denied dizziness. No s/s of distress noted.    Patient tolerated treatment and hydration with no complaints voiced.  Port site clean and dry with good blood return noted before and after treatment.  No bruising or swelling noted at site.  Dressing intact.  VSS with discharge and left ambulatory with no s/s of distress noted.

## 2018-02-09 NOTE — Patient Instructions (Signed)
[facility] Discharge Instructions for Patients Receiving Chemotherapy  Today you received the following chemotherapy agents etoposide.    If you develop nausea and vomiting that is not controlled by your nausea medication, call the clinic.   BELOW ARE SYMPTOMS THAT SHOULD BE REPORTED IMMEDIATELY:  *FEVER GREATER THAN 100.5 F  *CHILLS WITH OR WITHOUT FEVER  NAUSEA AND VOMITING THAT IS NOT CONTROLLED WITH YOUR NAUSEA MEDICATION  *UNUSUAL SHORTNESS OF BREATH  *UNUSUAL BRUISING OR BLEEDING  TENDERNESS IN MOUTH AND THROAT WITH OR WITHOUT PRESENCE OF ULCERS  *URINARY PROBLEMS  *BOWEL PROBLEMS  UNUSUAL RASH Items with * indicate a potential emergency and should be followed up as soon as possible.  Feel free to call the clinic should you have any questions or concerns. The clinic phone number is [phone].  Please show the [facility] at check-in to the Emergency Department and triage nurse.

## 2018-02-10 ENCOUNTER — Inpatient Hospital Stay (HOSPITAL_COMMUNITY): Payer: Medicare Other

## 2018-02-10 VITALS — BP 103/58 | HR 67 | Temp 97.9°F | Resp 16 | Wt 142.8 lb

## 2018-02-10 DIAGNOSIS — Z5111 Encounter for antineoplastic chemotherapy: Secondary | ICD-10-CM | POA: Diagnosis not present

## 2018-02-10 DIAGNOSIS — Z9221 Personal history of antineoplastic chemotherapy: Secondary | ICD-10-CM | POA: Diagnosis not present

## 2018-02-10 DIAGNOSIS — C3401 Malignant neoplasm of right main bronchus: Secondary | ICD-10-CM | POA: Diagnosis not present

## 2018-02-10 DIAGNOSIS — Z720 Tobacco use: Secondary | ICD-10-CM | POA: Diagnosis not present

## 2018-02-10 DIAGNOSIS — C3490 Malignant neoplasm of unspecified part of unspecified bronchus or lung: Secondary | ICD-10-CM

## 2018-02-10 DIAGNOSIS — C3431 Malignant neoplasm of lower lobe, right bronchus or lung: Secondary | ICD-10-CM | POA: Diagnosis not present

## 2018-02-10 DIAGNOSIS — R197 Diarrhea, unspecified: Secondary | ICD-10-CM | POA: Diagnosis not present

## 2018-02-10 DIAGNOSIS — Z510 Encounter for antineoplastic radiation therapy: Secondary | ICD-10-CM | POA: Diagnosis not present

## 2018-02-10 DIAGNOSIS — Z79899 Other long term (current) drug therapy: Secondary | ICD-10-CM | POA: Diagnosis not present

## 2018-02-10 MED ORDER — SODIUM CHLORIDE 0.9 % IV SOLN
10.0000 mg | Freq: Once | INTRAVENOUS | Status: AC
  Administered 2018-02-10: 10 mg via INTRAVENOUS
  Filled 2018-02-10: qty 1

## 2018-02-10 MED ORDER — SODIUM CHLORIDE 0.9 % IV SOLN
100.0000 mg/m2 | Freq: Once | INTRAVENOUS | Status: AC
  Administered 2018-02-10: 170 mg via INTRAVENOUS
  Filled 2018-02-10: qty 8.5

## 2018-02-10 MED ORDER — HEPARIN SOD (PORK) LOCK FLUSH 100 UNIT/ML IV SOLN
500.0000 [IU] | Freq: Once | INTRAVENOUS | Status: AC | PRN
  Administered 2018-02-10: 500 [IU]

## 2018-02-10 MED ORDER — SODIUM CHLORIDE 0.9% FLUSH
10.0000 mL | INTRAVENOUS | Status: DC | PRN
  Administered 2018-02-10: 10 mL
  Filled 2018-02-10: qty 10

## 2018-02-10 MED ORDER — SODIUM CHLORIDE 0.9 % IV SOLN
Freq: Once | INTRAVENOUS | Status: AC
  Administered 2018-02-10: 11:00:00 via INTRAVENOUS

## 2018-02-10 NOTE — Progress Notes (Signed)
Nutrition Follow-up:  Patient with lung cancer.  Patient on chemotherapy and radiation therapy.  Met with patient, ex wife and her husband in infusion today.  Patient reports ate good breakfast that last 2 days.  This am ate eggs, biscuit with gravy, and tenderloin.  Yesterday ate good breakfast, nabs, ensure and 1/2 bottle of water.    Reports that he started taking remeron last night.  Medications: remeron  Labs: reviewed  Anthropometrics:   Weight noted on 12/11 to be 138 lb, today 142 lb 12.8 oz  Weight on first RD visit 138 lb UBW 160 lb in July   NUTRITION DIAGNOSIS: Malnutrition continues  MALNUTRITION DIAGNOSIS: severe malnutrition stable   INTERVENTION:  Discussed importance of eating more calories during the day.   Encouraged small frequent meals Trying to drink 3 ensure per day.  Provided another case of ensure enlive to patient today.  Patient has snacks today for treatment.      MONITORING, EVALUATION, GOAL: weight trends, intake   NEXT VISIT: to be determined with treatment  Aleiya Rye B. Zenia Resides, [place], [place] Registered Dietitian [phone] (pager)

## 2018-02-10 NOTE — Progress Notes (Signed)
Pt here today for day VP16. VSS. No complaints of any changes since yesterday's visit.  Communication sent for lab appointments with next visit.

## 2018-02-13 ENCOUNTER — Other Ambulatory Visit (HOSPITAL_COMMUNITY): Payer: Self-pay | Admitting: *Deleted

## 2018-02-13 DIAGNOSIS — C3401 Malignant neoplasm of right main bronchus: Secondary | ICD-10-CM | POA: Diagnosis not present

## 2018-02-13 DIAGNOSIS — Z510 Encounter for antineoplastic radiation therapy: Secondary | ICD-10-CM | POA: Diagnosis not present

## 2018-02-13 DIAGNOSIS — Z9221 Personal history of antineoplastic chemotherapy: Secondary | ICD-10-CM | POA: Diagnosis not present

## 2018-02-14 ENCOUNTER — Other Ambulatory Visit (HOSPITAL_COMMUNITY): Payer: Self-pay | Admitting: *Deleted

## 2018-02-14 DIAGNOSIS — C3401 Malignant neoplasm of right main bronchus: Secondary | ICD-10-CM | POA: Diagnosis not present

## 2018-02-14 DIAGNOSIS — Z510 Encounter for antineoplastic radiation therapy: Secondary | ICD-10-CM | POA: Diagnosis not present

## 2018-02-14 DIAGNOSIS — C3490 Malignant neoplasm of unspecified part of unspecified bronchus or lung: Secondary | ICD-10-CM

## 2018-02-14 DIAGNOSIS — Z9221 Personal history of antineoplastic chemotherapy: Secondary | ICD-10-CM | POA: Diagnosis not present

## 2018-02-14 MED ORDER — LORAZEPAM 1 MG PO TABS: 1.0000 mg | ORAL_TABLET | Freq: Two times a day (BID) | ORAL | 0 refills | Status: DC

## 2018-02-14 NOTE — Telephone Encounter (Signed)
Okay to refill ativan 1 mg BID per Francene Finders, NP. [place].

## 2018-02-15 DIAGNOSIS — Z510 Encounter for antineoplastic radiation therapy: Secondary | ICD-10-CM | POA: Diagnosis not present

## 2018-02-15 DIAGNOSIS — Z9221 Personal history of antineoplastic chemotherapy: Secondary | ICD-10-CM | POA: Diagnosis not present

## 2018-02-15 DIAGNOSIS — C3401 Malignant neoplasm of right main bronchus: Secondary | ICD-10-CM | POA: Diagnosis not present

## 2018-02-16 DIAGNOSIS — Z510 Encounter for antineoplastic radiation therapy: Secondary | ICD-10-CM | POA: Diagnosis not present

## 2018-02-16 DIAGNOSIS — C3431 Malignant neoplasm of lower lobe, right bronchus or lung: Secondary | ICD-10-CM | POA: Diagnosis not present

## 2018-02-16 DIAGNOSIS — C3401 Malignant neoplasm of right main bronchus: Secondary | ICD-10-CM | POA: Diagnosis not present

## 2018-02-16 DIAGNOSIS — Z9221 Personal history of antineoplastic chemotherapy: Secondary | ICD-10-CM | POA: Diagnosis not present

## 2018-02-17 DIAGNOSIS — C3401 Malignant neoplasm of right main bronchus: Secondary | ICD-10-CM | POA: Diagnosis not present

## 2018-02-17 DIAGNOSIS — Z510 Encounter for antineoplastic radiation therapy: Secondary | ICD-10-CM | POA: Diagnosis not present

## 2018-02-17 DIAGNOSIS — Z9221 Personal history of antineoplastic chemotherapy: Secondary | ICD-10-CM | POA: Diagnosis not present

## 2018-02-20 DIAGNOSIS — C3401 Malignant neoplasm of right main bronchus: Secondary | ICD-10-CM | POA: Diagnosis not present

## 2018-02-20 DIAGNOSIS — Z510 Encounter for antineoplastic radiation therapy: Secondary | ICD-10-CM | POA: Diagnosis not present

## 2018-02-20 DIAGNOSIS — Z9221 Personal history of antineoplastic chemotherapy: Secondary | ICD-10-CM | POA: Diagnosis not present

## 2018-02-23 DIAGNOSIS — C3401 Malignant neoplasm of right main bronchus: Secondary | ICD-10-CM | POA: Diagnosis not present

## 2018-02-23 DIAGNOSIS — Z9221 Personal history of antineoplastic chemotherapy: Secondary | ICD-10-CM | POA: Diagnosis not present

## 2018-02-23 DIAGNOSIS — Z510 Encounter for antineoplastic radiation therapy: Secondary | ICD-10-CM | POA: Diagnosis not present

## 2018-02-24 DIAGNOSIS — Z510 Encounter for antineoplastic radiation therapy: Secondary | ICD-10-CM | POA: Diagnosis not present

## 2018-02-24 DIAGNOSIS — C3401 Malignant neoplasm of right main bronchus: Secondary | ICD-10-CM | POA: Diagnosis not present

## 2018-02-24 DIAGNOSIS — Z9221 Personal history of antineoplastic chemotherapy: Secondary | ICD-10-CM | POA: Diagnosis not present

## 2018-02-24 DIAGNOSIS — C3431 Malignant neoplasm of lower lobe, right bronchus or lung: Secondary | ICD-10-CM | POA: Diagnosis not present

## 2018-02-27 DIAGNOSIS — Z9221 Personal history of antineoplastic chemotherapy: Secondary | ICD-10-CM | POA: Diagnosis not present

## 2018-02-27 DIAGNOSIS — Z510 Encounter for antineoplastic radiation therapy: Secondary | ICD-10-CM | POA: Diagnosis not present

## 2018-02-27 DIAGNOSIS — C3401 Malignant neoplasm of right main bronchus: Secondary | ICD-10-CM | POA: Diagnosis not present

## 2018-02-28 DIAGNOSIS — C3401 Malignant neoplasm of right main bronchus: Secondary | ICD-10-CM | POA: Diagnosis not present

## 2018-02-28 DIAGNOSIS — Z510 Encounter for antineoplastic radiation therapy: Secondary | ICD-10-CM | POA: Diagnosis not present

## 2018-02-28 DIAGNOSIS — Z9221 Personal history of antineoplastic chemotherapy: Secondary | ICD-10-CM | POA: Diagnosis not present

## 2018-03-02 DIAGNOSIS — C3431 Malignant neoplasm of lower lobe, right bronchus or lung: Secondary | ICD-10-CM | POA: Diagnosis not present

## 2018-03-02 DIAGNOSIS — Z9221 Personal history of antineoplastic chemotherapy: Secondary | ICD-10-CM | POA: Diagnosis not present

## 2018-03-02 DIAGNOSIS — C3401 Malignant neoplasm of right main bronchus: Secondary | ICD-10-CM | POA: Diagnosis not present

## 2018-03-02 DIAGNOSIS — Z510 Encounter for antineoplastic radiation therapy: Secondary | ICD-10-CM | POA: Diagnosis not present

## 2018-03-03 ENCOUNTER — Inpatient Hospital Stay (HOSPITAL_COMMUNITY): Payer: Medicare Other | Attending: Hematology

## 2018-03-03 DIAGNOSIS — Z5111 Encounter for antineoplastic chemotherapy: Secondary | ICD-10-CM | POA: Diagnosis not present

## 2018-03-03 DIAGNOSIS — Z9221 Personal history of antineoplastic chemotherapy: Secondary | ICD-10-CM | POA: Diagnosis not present

## 2018-03-03 DIAGNOSIS — C3401 Malignant neoplasm of right main bronchus: Secondary | ICD-10-CM | POA: Diagnosis not present

## 2018-03-03 DIAGNOSIS — Z720 Tobacco use: Secondary | ICD-10-CM | POA: Insufficient documentation

## 2018-03-03 DIAGNOSIS — C3490 Malignant neoplasm of unspecified part of unspecified bronchus or lung: Secondary | ICD-10-CM

## 2018-03-03 DIAGNOSIS — R197 Diarrhea, unspecified: Secondary | ICD-10-CM | POA: Diagnosis not present

## 2018-03-03 DIAGNOSIS — C3431 Malignant neoplasm of lower lobe, right bronchus or lung: Secondary | ICD-10-CM | POA: Diagnosis not present

## 2018-03-03 DIAGNOSIS — Z510 Encounter for antineoplastic radiation therapy: Secondary | ICD-10-CM | POA: Diagnosis not present

## 2018-03-03 LAB — COMPREHENSIVE METABOLIC PANEL
ALT: 11 U/L (ref 0–44)
AST: 16 U/L (ref 15–41)
Albumin: 3.2 g/dL — ABNORMAL LOW (ref 3.5–5.0)
Alkaline Phosphatase: 60 U/L (ref 38–126)
Anion gap: 8 (ref 5–15)
BUN: 12 mg/dL (ref 8–23)
CO2: 27 mmol/L (ref 22–32)
Calcium: 8.3 mg/dL — ABNORMAL LOW (ref 8.9–10.3)
Chloride: 105 mmol/L (ref 98–111)
Creatinine, Ser: 0.81 mg/dL (ref 0.61–1.24)
GFR calc Af Amer: >60 mL/min (ref >60)
GFR calc non Af Amer: >60 mL/min (ref >60)
GLUCOSE: 111 mg/dL — AB (ref 70–99)
Potassium: 3.8 mmol/L (ref 3.5–5.1)
SODIUM: 140 mmol/L (ref 135–145)
Total Bilirubin: 0.5 mg/dL (ref 0.3–1.2)
Total Protein: 6.6 g/dL (ref 6.5–8.1)

## 2018-03-03 LAB — CBC WITH DIFFERENTIAL/PLATELET
ABS IMMATURE GRANULOCYTES: 0.06 10*3/uL (ref 0.00–0.07)
Basophils Absolute: 0 10*3/uL (ref 0.0–0.1)
Basophils Relative: 1 %
Eosinophils Absolute: 0 10*3/uL (ref 0.0–0.5)
Eosinophils Relative: 0 %
HCT: 30.1 % — ABNORMAL LOW (ref 39.0–52.0)
Hemoglobin: 9.8 g/dL — ABNORMAL LOW (ref 13.0–17.0)
Immature Granulocytes: 1 %
Lymphocytes Relative: 14 %
Lymphs Abs: 0.6 10*3/uL — ABNORMAL LOW (ref 0.7–4.0)
MCH: 32 pg (ref 26.0–34.0)
MCHC: 32.6 g/dL (ref 30.0–36.0)
MCV: 98.4 fL (ref 80.0–100.0)
Monocytes Absolute: 0.9 10*3/uL (ref 0.1–1.0)
Monocytes Relative: 19 %
NEUTROS ABS: 3 10*3/uL (ref 1.7–7.7)
NRBC: 0 % (ref 0.0–0.2)
Neutrophils Relative %: 65 %
Platelets: 230 10*3/uL (ref 150–400)
RBC: 3.06 MIL/uL — ABNORMAL LOW (ref 4.22–5.81)
RDW: 17.2 % — ABNORMAL HIGH (ref 11.5–15.5)
WBC: 4.7 10*3/uL (ref 4.0–10.5)

## 2018-03-03 LAB — MAGNESIUM: Magnesium: 2.2 mg/dL (ref 1.7–2.4)

## 2018-03-06 ENCOUNTER — Encounter (HOSPITAL_COMMUNITY): Payer: Self-pay | Admitting: Hematology

## 2018-03-06 ENCOUNTER — Inpatient Hospital Stay (HOSPITAL_COMMUNITY): Payer: Medicare Other

## 2018-03-06 ENCOUNTER — Inpatient Hospital Stay (HOSPITAL_BASED_OUTPATIENT_CLINIC_OR_DEPARTMENT_OTHER): Payer: Medicare Other | Admitting: Hematology

## 2018-03-06 ENCOUNTER — Other Ambulatory Visit: Payer: Self-pay

## 2018-03-06 ENCOUNTER — Encounter (HOSPITAL_COMMUNITY): Payer: Self-pay

## 2018-03-06 ENCOUNTER — Ambulatory Visit (HOSPITAL_COMMUNITY): Payer: Medicare Other | Admitting: Hematology

## 2018-03-06 ENCOUNTER — Other Ambulatory Visit (HOSPITAL_COMMUNITY): Payer: Medicare Other

## 2018-03-06 VITALS — BP 98/65 | HR 76 | Temp 97.7°F | Resp 18 | Wt 137.8 lb

## 2018-03-06 DIAGNOSIS — Z720 Tobacco use: Secondary | ICD-10-CM

## 2018-03-06 DIAGNOSIS — C3431 Malignant neoplasm of lower lobe, right bronchus or lung: Secondary | ICD-10-CM

## 2018-03-06 DIAGNOSIS — Z510 Encounter for antineoplastic radiation therapy: Secondary | ICD-10-CM | POA: Diagnosis not present

## 2018-03-06 DIAGNOSIS — Z9221 Personal history of antineoplastic chemotherapy: Secondary | ICD-10-CM | POA: Diagnosis not present

## 2018-03-06 DIAGNOSIS — R197 Diarrhea, unspecified: Secondary | ICD-10-CM

## 2018-03-06 DIAGNOSIS — R5383 Other fatigue: Secondary | ICD-10-CM

## 2018-03-06 DIAGNOSIS — R42 Dizziness and giddiness: Secondary | ICD-10-CM | POA: Diagnosis not present

## 2018-03-06 DIAGNOSIS — C3490 Malignant neoplasm of unspecified part of unspecified bronchus or lung: Secondary | ICD-10-CM

## 2018-03-06 DIAGNOSIS — Z5111 Encounter for antineoplastic chemotherapy: Secondary | ICD-10-CM | POA: Diagnosis not present

## 2018-03-06 DIAGNOSIS — R634 Abnormal weight loss: Secondary | ICD-10-CM | POA: Diagnosis not present

## 2018-03-06 DIAGNOSIS — C3401 Malignant neoplasm of right main bronchus: Secondary | ICD-10-CM | POA: Diagnosis not present

## 2018-03-06 MED ORDER — SODIUM CHLORIDE 0.9 % IV SOLN
Freq: Once | INTRAVENOUS | Status: AC
  Administered 2018-03-06: 10:00:00 via INTRAVENOUS

## 2018-03-06 MED ORDER — SODIUM CHLORIDE 0.9 % IV SOLN
409.5000 mg | Freq: Once | INTRAVENOUS | Status: AC
  Administered 2018-03-06: 410 mg via INTRAVENOUS
  Filled 2018-03-06: qty 41

## 2018-03-06 MED ORDER — PALONOSETRON HCL INJECTION 0.25 MG/5ML
0.2500 mg | Freq: Once | INTRAVENOUS | Status: AC
  Administered 2018-03-06: 0.25 mg via INTRAVENOUS
  Filled 2018-03-06: qty 5

## 2018-03-06 MED ORDER — SODIUM CHLORIDE 0.9 % IV SOLN
100.0000 mg/m2 | Freq: Once | INTRAVENOUS | Status: AC
  Administered 2018-03-06: 170 mg via INTRAVENOUS
  Filled 2018-03-06: qty 8.5

## 2018-03-06 MED ORDER — SODIUM CHLORIDE 0.9 % IV SOLN
Freq: Once | INTRAVENOUS | Status: AC
  Administered 2018-03-06: 10:00:00 via INTRAVENOUS
  Filled 2018-03-06: qty 5

## 2018-03-06 NOTE — Progress Notes (Signed)
Tolerated infusions w/o adverse reaction.  Alert, in no distress.  VSS.  Discharged ambulatory in c/o family.

## 2018-03-06 NOTE — Assessment & Plan Note (Signed)
1.  Limited stage small cell lung cancer: - He had a recent fall, CT of the abdomen and pelvis on 11/08/2017 showed irregular narrowing of the right lower lobe bronchus with associated right hilar adenopathy. - Subsequent PET CT scan on 11/28/2017 shows lesion at the inferior lobar bronchus with SUV of 5.5 with the right hilar hypermetabolic and ipsilateral mediastinal adenopathy including right paratracheal, subcarinal and several small prevascular lymph nodes which demonstrate only low-grade metabolic activity. - On 82/42/3536 bronchoscopy and EBUS biopsy done by Dr. Roxan Hockey shows small cell carcinoma of the right bronchus biopsy.  Lymph node aspirations were also consistent with small cell lung cancer. - Patient is a current active smoker, smokes 1 pack/day for 60+ years.  He also had a brief exposure to asbestos.  He is a [place] [employer] veteran, was not sure whether he was exposed to agent orange. - He lives by himself and is independent of ADLs and IADLs.  He had 50 pound weight loss since January and a EGD done in [place] was consistent with gastroparesis. - We talked about the normal prognosis of small cell lung cancer.  For patients with limited stage disease, medial survival usually ranges between 15 to 20 months and reported 5-year survival is about 10 to 13%. - Combination chemoradiation therapy with 4 cycles of carboplatin and etoposide was recommended.  He was not felt to be a candidate for cisplatin.  If he gets partial response, he will be offered PCI.  - MRI of the brain on 01/06/2018 did not show metastatic disease.  -2 cycles of chemotherapy with carboplatin and VP-16 along with radiation therapy on 01/18/2018 and 02/08/2018. -He will finish radiation therapy on 03/08/2018. -He tolerated cycle 2 reasonably well.  He lost about 1 pound.  He had diarrhea when he was drinking 3 cans of boost per day.  When he cut back to 1 can/day, diarrhea subsided. -He had mild fatigue which is stable. -He  will continue Remeron for his depression. -We reviewed his blood work.  He may proceed with cycle 3 today without any dose modifications.  He had mild anemia with hemoglobin of 9.4.  We will watch it carefully. - He will come back in 3 weeks for follow-up and cycle 4.  We will plan to repeat scans after cycle 4.  2.  Weight loss: -He lost about 1 pound since cycle 2.  He is drinking 1 can of boost per day.  He is eating 3 meals per day. - His appetite is slightly better after he was started on Remeron.

## 2018-03-06 NOTE — Patient Instructions (Addendum)
[facility] at [facility] Discharge Instructions Follow up in 3 weeks with labs and treatment.    Thank you for choosing [facility] at [facility] to provide your oncology and hematology care.  To afford each patient quality time with our provider, please arrive at least 15 minutes before your scheduled appointment time.   If you have a lab appointment with the Cancer Center please come in thru the  Main Entrance and check in at the main information desk  You need to re-schedule your appointment should you arrive 10 or more minutes late.  We strive to give you quality time with our providers, and arriving late affects you and other patients whose appointments are after yours.  Also, if you no show three or more times for appointments you may be dismissed from the clinic at the providers discretion.     Again, thank you for choosing [facility].  Our hope is that these requests will decrease the amount of time that you wait before being seen by our physicians.       _____________________________________________________________  Should you have questions after your visit to  Cancer Center, please contact our office at [phone] between the hours of 8:00 a.m. and 4:30 p.m.  Voicemails left after 4:00 p.m. will not be returned until the following business day.  For prescription refill requests, have your pharmacy contact our office and allow 72 hours.    Cancer Center Support Programs:   > Cancer Support Group  2nd Tuesday of the month 1pm-2pm, Journey Room    

## 2018-03-06 NOTE — Progress Notes (Signed)
[address] [place], [place] [address]   CLINIC:  Medical Oncology/Hematology  PCP:  Rory Percy, MD [place] [address] [phone]   REASON FOR VISIT: Follow-up for limited stage small cell lung  CURRENT THERAPY: chemoradiation   BRIEF ONCOLOGIC HISTORY:    Small cell lung cancer ([place])   12/30/2017 Initial Diagnosis    Small cell lung cancer ([place])    01/18/2018 -  Chemotherapy    The patient had palonosetron (ALOXI) injection 0.25 mg, 0.25 mg, Intravenous,  Once, 3 of 4 cycles Administration: 0.25 mg (01/18/2018), 0.25 mg (02/08/2018) CARBOplatin (PARAPLATIN) 300 mg in sodium chloride 0.9 % 250 mL chemo infusion, 300 mg (100 % of original dose 300 mg), Intravenous,  Once, 3 of 4 cycles Dose modification: 300 mg (original dose 300 mg, Cycle 1), 409.5 mg (original dose 409.5 mg, Cycle 2),   (original dose 409.5 mg, Cycle 3) Administration: 300 mg (01/18/2018), 410 mg (02/08/2018) etoposide (VEPESID) 160 mg in sodium chloride 0.9 % 500 mL chemo infusion, 90 mg/m2 = 160 mg (90 % of original dose 100 mg/m2), Intravenous,  Once, 3 of 4 cycles Dose modification: 90 mg/m2 (90 % of original dose 100 mg/m2, Cycle 1, Reason: Patient Age) Administration: 160 mg (01/18/2018), 170 mg (01/19/2018), 170 mg (02/10/2018), 170 mg (02/08/2018), 170 mg (02/09/2018), 170 mg (01/20/2018) fosaprepitant (EMEND) 150 mg, dexamethasone (DECADRON) 12 mg in sodium chloride 0.9 % 145 mL IVPB, , Intravenous,  Once, 3 of 4 cycles Administration:  (01/18/2018),  (02/08/2018)  for chemotherapy treatment.       INTERVAL HISTORY:  Terry Boyd 78 y.o. male returns for routine follow-up for limited stage small cell lung cancer. He is here today with his family. He is doing well. He is having some diarrhea and cut back on his boost shakes. It is now improved. He is also fatigued and has occasional dizziness. Denies any nausea or vomiting. Denies any new pains. Had not  noticed any recent bleeding such as epistaxis, hematuria or hematochezia. Denies recent chest pain on exertion, shortness of breath on minimal exertion, pre-syncopal episodes, or palpitations. Denies any numbness or tingling in hands or feet. Denies any recent fevers, infections, or recent hospitalizations. He reports his appetite and energy level is 25%.     REVIEW OF SYSTEMS:  Review of Systems  Constitutional: Positive for fatigue.  Neurological: Positive for dizziness.  All other systems reviewed and are negative.    PAST MEDICAL/SURGICAL HISTORY:  Past Medical History:  Diagnosis Date  . AAA (abdominal aortic aneurysm) ([place])   . Alternating constipation and diarrhea   . Arthritis   . Chronic back pain   . COPD (chronic obstructive pulmonary disease) ([place])   . Depression   . GERD (gastroesophageal reflux disease)   . History of hiatal hernia   . Hypertension    not on medications  . Hypothyroidism   . Lung cancer ([place])   . Peripheral vascular disease ([place])   . Smoker   . Thyroid disease    Past Surgical History:  Procedure Laterality Date  . ABDOMINAL AORTAGRAM N/A 03/14/2012   Procedure: ABDOMINAL Maxcine Ham;  Surgeon: Serafina Mitchell, MD;  Location: [facility] CATH LAB;  Service: Cardiovascular;  Laterality: N/A;  . ABDOMINAL AORTIC ANEURYSM REPAIR  04-22-2009   Stent graft repair of AAA  . APPENDECTOMY    . BRONCHIAL BRUSHINGS Right 11/30/2017   Procedure: BRONCHIAL BRUSHINGS;  Surgeon: Sinda Du, MD;  Location: [facility] ENDO  SUITE;  Service: Cardiopulmonary;  Laterality: Right;  . BRONCHIAL WASHINGS Right 11/30/2017   Procedure: BRONCHIAL WASHINGS;  Surgeon: Sinda Du, MD;  Location: [facility] END[facility] SU[facility];  Service: Cardiopulmonary;  Laterality: Right;  . BYPASS GRAFT POPLITEAL TO POPLITEAL  03/20/2012   Procedure: BYPASS GRAFT POPLITEAL TO POPLITEAL;  Surgeon: Elam Dutch, MD;  Location: [facility];  Service: Vascular;  Laterality: Left;  ABOVE THE KNEE POPLITEAL ARTERY TO BELOW  THE KNEE POPLITEAL ARTERY BYPASS GRAFT USING REVERSE LEFT GREATER SAPHENOUS VEIN   . ENDARTERECTOMY POPLITEAL  03/20/2012   Procedure: ENDARTERECTOMY POPLITEAL;  Surgeon: Elam Dutch, MD;  [facility];  Service: Vascular;  Laterality: Left;   LIGATION OF LEFT LEG POPLITEAL ANEURYSM  . FLEXIBLE BRONCHOSCOPY N/A 11/30/2017   Procedure: FLEXIBLE BRONCHOSCOPY;  Surgeon: Sinda Du, MD;  Location: [facility];  Service: Cardiopulmonary;  Laterality: N/A;  . INTRAOPERATIVE ARTERIOGRAM  03/20/2012   Procedure: INTRA OPERATIVE ARTERIOGRAM;  Surgeon: Elam Dutch, MD;  Location: [facility];  Service: Vascular;  Laterality: Left;  TIMES ONE  . PORTACATH PLACEMENT Left 01/06/2018   Procedure: INSERTION PORT-A-CATH;  Surgeon: Aviva Signs, MD;  Location: [facility] ORS;  Service: General;  Laterality: Left;  Marland Kitchen VASECTOMY  1982  . VIDEO BRONCHOSCOPY WITH ENDOBRONCHIAL ULTRASOUND N/A 12/23/2017   Procedure: VIDEO BRONCHOSCOPY WITH ENDOBRONCHIAL ULTRASOUND;  Surgeon: Melrose Nakayama, MD;  Location: [facility];  Service: Thoracic;  Laterality: N/A;     SOCIAL HISTORY:  Social History   Socioeconomic History  . Marital status: Divorced    Spouse name: Not on file  . Number of children: 2  . Years of education: Not on file  . Highest education level: Not on file  Occupational History  . Occupation: [place]     Comment: Agent Orange exposure  . Occupation: Sales executive    Comment: [place] exsposure  Social Needs  . Financial resource strain: Not hard at all  . Food insecurity:    Worry: Never true    Inability: Never true  . Transportation needs:    Medical: No    Non-medical: No  Tobacco Use  . Smoking status: Current Every Day Smoker    Years: 62.00    Types: Cigars  . Smokeless tobacco: Never Used  . Tobacco comment: pt states he smokes about 15 cigars per day  Substance and Sexual Activity  . Alcohol use: No  . Drug use: No  . Sexual activity: Yes    Birth  control/protection: None  Lifestyle  . Physical activity:    Days per week: 0 days    Minutes per session: 0 min  . Stress: To some extent  Relationships  . Social connections:    Talks on phone: More than three times a week    Gets together: Once a week    Attends religious service: More than 4 times per year    Active member of club or organization: No    Attends meetings of clubs or organizations: Never    Relationship status: Divorced  . Intimate partner violence:    Fear of current or ex partner: No    Emotionally abused: No    Physically abused: No    Forced sexual activity: No  Other Topics Concern  . Not on file  Social History Narrative   Sedentary    FAMILY HISTORY:  Family History  Problem Relation Age of Onset  . Diabetes Mother   . Stroke Mother   . Heart disease Father   .  Diabetes Father   . Heart disease Sister        CABG x 4  . Cancer Brother        throat cancer  . Diabetes Sister   . Heart disease Sister   . Cancer Brother   . Kidney disease Son     CURRENT MEDICATIONS:  Outpatient Encounter Medications as of 03/06/2018  Medication Sig  . CARBOPLATIN IV Inject into the vein every 21 ( twenty-one) days.   . ETOPOSIDE IV Inject 170 mg into the vein every 21 ( twenty-one) days.   . Ipratropium-Albuterol (COMBIVENT RESPIMAT) 20-100 MCG/ACT AERS respimat Inhale 1 puff into the lungs every 6 (six) hours as needed for wheezing.  Marland Kitchen levothyroxine (SYNTHROID, LEVOTHROID) 137 MCG tablet Take 137 mcg by mouth daily before breakfast.   . lidocaine-prilocaine (EMLA) cream Apply small amount to port site and cover with plastic wrap one hour prior to appointment.  Marland Kitchen LORazepam (ATIVAN) 1 MG tablet Take 1 tablet (1 mg total) by mouth 2 (two) times daily.  . metoCLOPramide (REGLAN) 5 MG tablet Take 5 mg by mouth daily before lunch.  . mirtazapine (REMERON) 7.5 MG tablet Take 1 tablet (7.5 mg total) by mouth at bedtime.  Marland Kitchen omeprazole (PRILOSEC) 20 MG capsule Take 20  mg by mouth daily.  Marland Kitchen POTASSIUM PO Take 1 tablet by mouth daily.  . prochlorperazine (COMPAZINE) 10 MG tablet Take 1 tablet (10 mg total) by mouth every 6 (six) hours as needed (Nausea or vomiting).  . sertraline (ZOLOFT) 50 MG tablet Take 50 mg by mouth every morning.   . simvastatin (ZOCOR) 10 MG tablet Take 10 mg by mouth at bedtime.  . traMADol (ULTRAM) 50 MG tablet Take 1 tablet (50 mg total) by mouth every 6 (six) hours as needed.   No facility-administered encounter medications on file as of 03/06/2018.     ALLERGIES:  Allergies  Allergen Reactions  . Morphine And Related Shortness Of Breath and Other (See Comments)    Hallucinations pt has taken hydromorphone before     PHYSICAL EXAM:  ECOG Performance status: 1  VITAL SIGNS:BP:106/66, P:84, R:18, T:97.7, O2:99% DGUYQI:347  Physical Exam Constitutional:      Appearance: Normal appearance. He is normal weight.  Musculoskeletal: Normal range of motion.  Skin:    General: Skin is warm and dry.  Neurological:     Mental Status: He is alert and oriented to person, place, and time. Mental status is at baseline.  Psychiatric:        Mood and Affect: Mood normal.        Behavior: Behavior normal.        Thought Content: Thought content normal.        Judgment: Judgment normal.      LABORATORY DATA:  I have reviewed the labs as listed.  CBC    Component Value Date/Time   WBC 4.7 03/03/2018 1050   RBC 3.06 (L) 03/03/2018 1050   HGB 9.8 (L) 03/03/2018 1050   HCT 30.1 (L) 03/03/2018 1050   PLT 230 03/03/2018 1050   MCV 98.4 03/03/2018 1050   MCH 32.0 03/03/2018 1050   MCHC 32.6 03/03/2018 1050   RDW 17.2 (H) 03/03/2018 1050   LYMPHSABS 0.6 (L) 03/03/2018 1050   MONOABS 0.9 03/03/2018 1050   EOSABS 0.0 03/03/2018 1050   BASOSABS 0.0 03/03/2018 1050   CMP Latest Ref Rng & Units 03/03/2018 02/07/2018 01/18/2018  Glucose 70 - 99 mg/dL 111(H) 114(H) 107(H)  BUN 8 -  23 mg/dL _0 Creatinine 0.61 - 1.24 mg/dL  0.81 0.86 1.06  Sodium 135 - 145 mmol/L 140 140 139  Potassium 3.5 - 5.1 mmol/L 3.8 3.7 4.3  Chloride 98 - 111 mmol/L 105 105 103  CO2 22 - 32 mmol/L _1 Calcium 8.9 - 10.3 mg/dL 8.3(L) 8.8(L) 9.2  Total Protein 6.5 - 8.1 g/dL 6.6 7.1 7.2  Total Bilirubin 0.3 - 1.2 mg/dL 0.5 0.7 0.5  Alkaline Phos 38 - 126 U/L 60 71 62  AST 15 - 41 U/L _2 ALT 0 - 44 U/L _3 DIAGNOSTIC IMAGING:  I have independently reviewed the scans and discussed with the patient.   I have reviewed Francene Finders, NP's note and agree with the documentation.  I personally performed a face-to-face visit, made revisions and my assessment and plan is as follows.    ASSESSMENT & PLAN:   Small cell lung cancer ([place]) 1.  Limited stage small cell lung cancer: - He had a recent fall, CT of the abdomen and pelvis on 11/08/2017 showed irregular narrowing of the right lower lobe bronchus with associated right hilar adenopathy. - Subsequent PET CT scan on 11/28/2017 shows lesion at the inferior lobar bronchus with SUV of 5.5 with the right hilar hypermetabolic and ipsilateral mediastinal adenopathy including right paratracheal, subcarinal and several small prevascular lymph nodes which demonstrate only low-grade metabolic activity. - On 09/32/6712 bronchoscopy and EBUS biopsy done by Dr. Roxan Hockey shows small cell carcinoma of the right bronchus biopsy.  Lymph node aspirations were also consistent with small cell lung cancer. - Patient is a current active smoker, smokes 1 pack/day for 60+ years.  He also had a brief exposure to asbestos.  He is a [place] [employer] veteran, was not sure whether he was exposed to agent orange. - He lives by himself and is independent of ADLs and IADLs.  He had 50 pound weight loss since January and a EGD done in [place] was consistent with gastroparesis. - We talked about the normal prognosis of small cell lung cancer.  For patients with limited stage disease, medial survival usually  ranges between 15 to 20 months and reported 5-year survival is about 10 to 13%. - Combination chemoradiation therapy with 4 cycles of carboplatin and etoposide was recommended.  He was not felt to be a candidate for cisplatin.  If he gets partial response, he will be offered PCI.  - MRI of the brain on 01/06/2018 did not show metastatic disease.  -2 cycles of chemotherapy with carboplatin and VP-16 along with radiation therapy on 01/18/2018 and 02/08/2018. -He will finish radiation therapy on 03/08/2018. -He tolerated cycle 2 reasonably well.  He lost about 1 pound.  He had diarrhea when he was drinking 3 cans of boost per day.  When he cut back to 1 can/day, diarrhea subsided. -He had mild fatigue which is stable. -He will continue Remeron for his depression. -We reviewed his blood work.  He may proceed with cycle 3 today without any dose modifications.  He had mild anemia with hemoglobin of 9.4.  We will watch it carefully. - He will come back in 3 weeks for follow-up and cycle 4.  We will plan to repeat scans after cycle 4.  2.  Weight loss: -He lost about 1 pound since cycle 2.  He is drinking 1 can of boost per day.  He is eating 3 meals per day. -  His appetite is slightly better after he was started on Remeron.      Orders placed this encounter:  Orders Placed This Encounter  Procedures  . Magnesium  . CBC with Differential/Platelet  . Comprehensive metabolic panel      Derek Jack, MD [place] [phone]

## 2018-03-07 ENCOUNTER — Inpatient Hospital Stay (HOSPITAL_COMMUNITY): Payer: Medicare Other | Admitting: Dietician

## 2018-03-07 ENCOUNTER — Inpatient Hospital Stay (HOSPITAL_COMMUNITY): Payer: Medicare Other

## 2018-03-07 VITALS — BP 103/52 | HR 72 | Temp 97.8°F | Resp 18 | Wt 142.0 lb

## 2018-03-07 DIAGNOSIS — Z5111 Encounter for antineoplastic chemotherapy: Secondary | ICD-10-CM | POA: Diagnosis not present

## 2018-03-07 DIAGNOSIS — C3490 Malignant neoplasm of unspecified part of unspecified bronchus or lung: Secondary | ICD-10-CM

## 2018-03-07 DIAGNOSIS — R197 Diarrhea, unspecified: Secondary | ICD-10-CM | POA: Diagnosis not present

## 2018-03-07 DIAGNOSIS — Z510 Encounter for antineoplastic radiation therapy: Secondary | ICD-10-CM | POA: Diagnosis not present

## 2018-03-07 DIAGNOSIS — Z9221 Personal history of antineoplastic chemotherapy: Secondary | ICD-10-CM | POA: Diagnosis not present

## 2018-03-07 DIAGNOSIS — C3401 Malignant neoplasm of right main bronchus: Secondary | ICD-10-CM | POA: Diagnosis not present

## 2018-03-07 DIAGNOSIS — C3431 Malignant neoplasm of lower lobe, right bronchus or lung: Secondary | ICD-10-CM | POA: Diagnosis not present

## 2018-03-07 DIAGNOSIS — Z720 Tobacco use: Secondary | ICD-10-CM | POA: Diagnosis not present

## 2018-03-07 MED ORDER — SODIUM CHLORIDE 0.9 % IV SOLN
Freq: Once | INTRAVENOUS | Status: AC
  Administered 2018-03-07: 11:00:00 via INTRAVENOUS

## 2018-03-07 MED ORDER — SODIUM CHLORIDE 0.9 % IV SOLN
10.0000 mg | Freq: Once | INTRAVENOUS | Status: AC
  Administered 2018-03-07: 10 mg via INTRAVENOUS
  Filled 2018-03-07: qty 1

## 2018-03-07 MED ORDER — SODIUM CHLORIDE 0.9% FLUSH: 10.0000 mL | INTRAVENOUS | Status: DC | PRN

## 2018-03-07 MED ORDER — HEPARIN SOD (PORK) LOCK FLUSH 100 UNIT/ML IV SOLN: 500.0000 [IU] | Freq: Once | INTRAVENOUS | Status: DC | PRN

## 2018-03-07 MED ORDER — SODIUM CHLORIDE 0.9 % IV SOLN
100.0000 mg/m2 | Freq: Once | INTRAVENOUS | Status: AC
  Administered 2018-03-07: 170 mg via INTRAVENOUS
  Filled 2018-03-07: qty 8.5

## 2018-03-07 NOTE — Patient Instructions (Signed)
[facility] Discharge Instructions for Patients Receiving Chemotherapy  Today you received the following chemotherapy agents   To help prevent nausea and vomiting after your treatment, we encourage you to take your nausea medication   If you develop nausea and vomiting that is not controlled by your nausea medication, call the clinic.   BELOW ARE SYMPTOMS THAT SHOULD BE REPORTED IMMEDIATELY:  *FEVER GREATER THAN 100.5 F  *CHILLS WITH OR WITHOUT FEVER  NAUSEA AND VOMITING THAT IS NOT CONTROLLED WITH YOUR NAUSEA MEDICATION  *UNUSUAL SHORTNESS OF BREATH  *UNUSUAL BRUISING OR BLEEDING  TENDERNESS IN MOUTH AND THROAT WITH OR WITHOUT PRESENCE OF ULCERS  *URINARY PROBLEMS  *BOWEL PROBLEMS  UNUSUAL RASH Items with * indicate a potential emergency and should be followed up as soon as possible.  Feel free to call the clinic should you have any questions or concerns. The clinic phone number is [phone].  Please show the CHEMO ALERT CARD at check-in to the Emergency Department and triage nurse.   

## 2018-03-07 NOTE — Progress Notes (Signed)
Pt presents today for Day 2 Etoposide Cycle 3. VSS. Pt has no complaints of any pain or changes since last visit.   Treatment given today per MD orders. Tolerated infusion without adverse affects. Vital signs stable. No complaints at this time. Discharged from clinic ambulatory. F/U with [facility] as scheduled.

## 2018-03-07 NOTE — Progress Notes (Signed)
Nutrition Follow-up 78 y/o male PMHx HTN, COPD, PVD, GERD, Depression. He is undergoing concomitant chemoradiation for SCLC.    RD following up with pt who has been followed by alternate Oncology RD. Per her last note 12/13, pt reported he was eating well. RD noted during that visit he had recently been started on Remeron. His weight at that visit was 142.8 lbs.   Today, pts weight is 142 lbs, but his weight yesterday was 137. 8 lbs? The yesterday looks more like it was measured as opposed to reported/pulled from other encounter.   Pt today is seen with 2 other family members. He says he has been eating well since starting on remeron. Quantity wise, he says he eats 3 meals and a HS snack. He says he is drinking Oral supplement, but he doesn't like them. He states they cause significant diarrhea. However, at the same time. He reports having problems with constipation as well. He denies n/v. He also reports a "knot" in his upper abdomen where the radiation is targeted. He says this tightening only impacts his first bite at a meals, after the first swallow he says "its easy after that"  He says he received 2 cases of Oral supplements from the [place].   Wt Readings from Last 10 Encounters:  03/07/18 142 lb (64.4 kg)  03/06/18 137 lb 12.6 oz (62.5 kg)  02/10/18 142 lb 12.8 oz (64.8 kg)  02/08/18 138 lb (62.6 kg)  01/20/18 143 lb 12.8 oz (65.2 kg)  01/19/18 142 lb 6.7 oz (64.6 kg)  01/18/18 139 lb (63 kg)  01/13/18 138 lb 6.4 oz (62.8 kg)  01/06/18 138 lb 14.2 oz (63 kg)  01/03/18 139 lb (63 kg)   MEDICATIONS:  Chemotherapy: Carboplatin, Etoposide Supportive medications: Ultram, H2RA, remeron, reglan, magic mouthwash, compazine, ativan  LABS: Glu: 105-115, Albumin: 3.2 on 1/3. Was 3.7 the month prior  ANTHROPOMETRICS: Height:  Ht Readings from Last 1 Encounters:  01/06/18 5' 8.5" (1.74 m)   Weight:  Wt Readings from Last 1 Encounters:  03/07/18 142 lb (64.4 kg)   BMI:  BMI Readings from  Last 1 Encounters:  03/07/18 21.28 kg/m   UBW: 180-190 Weight changes: Wt stable between 137-143 since October.   Down ~20 lbs x 5.5 months (>10%)  ESTIMATED ENERGY NEEDS:  Kcal: 1950-2150 kcals (30-33 kcal/kg bw) Protein: 90-105g Pro (1.4-1.6 g/kg bw) Fluid: >2 L (30 ml/kg bw)  NUTRITION DIAGNOSIS:  Increased protein/kcal needs related to cancer and cancer related therapies  DOCUMENTATION CODES:  Not applicable. (weight loss only parameter met)  INTERVENTION:  Reviewed patient diet recall and make suggestions on how he could increase calories/protein.   Reinforced small, frequent meals.   Stressed calorie containing beverages  If he having constipation, and the ensure/boost is causing loose stools, RD recommended he drink the boost when feels is becoming constipated. These issues should hopefully then offset each other. Offered Ensure-declined d/t just receiving 2 cases from the [place].   RD told pt he should contact cancer center or RD if he should have any negative acute changes in appetite, ability to eat or weight. He notes understanding.    GOAL:  Oral intake to meet >90% of needs, wt stability.   Both met as of now   MONITOR:  Oral intake, weights, tolerance to chemoradiation, labs, imaging  Next Visit:  1/28 -  During infusion  Burtis Junes RD, LDN, CNSC Clinical Nutrition Available Tues-Sat via Pager: [phone] 03/07/2018 1:09 PM

## 2018-03-08 ENCOUNTER — Inpatient Hospital Stay (HOSPITAL_COMMUNITY): Payer: Medicare Other

## 2018-03-08 ENCOUNTER — Encounter (HOSPITAL_COMMUNITY): Payer: Self-pay

## 2018-03-08 VITALS — BP 102/55 | HR 75 | Temp 97.6°F | Resp 18 | Wt 141.8 lb

## 2018-03-08 DIAGNOSIS — C3431 Malignant neoplasm of lower lobe, right bronchus or lung: Secondary | ICD-10-CM | POA: Diagnosis not present

## 2018-03-08 DIAGNOSIS — C3401 Malignant neoplasm of right main bronchus: Secondary | ICD-10-CM | POA: Diagnosis not present

## 2018-03-08 DIAGNOSIS — C3490 Malignant neoplasm of unspecified part of unspecified bronchus or lung: Secondary | ICD-10-CM

## 2018-03-08 DIAGNOSIS — Z5111 Encounter for antineoplastic chemotherapy: Secondary | ICD-10-CM | POA: Diagnosis not present

## 2018-03-08 DIAGNOSIS — Z720 Tobacco use: Secondary | ICD-10-CM | POA: Diagnosis not present

## 2018-03-08 DIAGNOSIS — R197 Diarrhea, unspecified: Secondary | ICD-10-CM | POA: Diagnosis not present

## 2018-03-08 DIAGNOSIS — Z9221 Personal history of antineoplastic chemotherapy: Secondary | ICD-10-CM | POA: Diagnosis not present

## 2018-03-08 DIAGNOSIS — Z510 Encounter for antineoplastic radiation therapy: Secondary | ICD-10-CM | POA: Diagnosis not present

## 2018-03-08 MED ORDER — SODIUM CHLORIDE 0.9 % IV SOLN
100.0000 mg/m2 | Freq: Once | INTRAVENOUS | Status: AC
  Administered 2018-03-08: 170 mg via INTRAVENOUS
  Filled 2018-03-08: qty 8.5

## 2018-03-08 MED ORDER — HEPARIN SOD (PORK) LOCK FLUSH 100 UNIT/ML IV SOLN
500.0000 [IU] | Freq: Once | INTRAVENOUS | Status: AC | PRN
  Administered 2018-03-08: 500 [IU]

## 2018-03-08 MED ORDER — SODIUM CHLORIDE 0.9 % IV SOLN
10.0000 mg | Freq: Once | INTRAVENOUS | Status: AC
  Administered 2018-03-08: 10 mg via INTRAVENOUS
  Filled 2018-03-08: qty 1

## 2018-03-08 MED ORDER — SODIUM CHLORIDE 0.9 % IV SOLN
Freq: Once | INTRAVENOUS | Status: AC
  Administered 2018-03-08: 11:00:00 via INTRAVENOUS

## 2018-03-08 MED ORDER — SODIUM CHLORIDE 0.9% FLUSH
10.0000 mL | INTRAVENOUS | Status: DC | PRN
  Administered 2018-03-08: 10 mL
  Filled 2018-03-08: qty 10

## 2018-03-08 NOTE — Patient Instructions (Signed)
[facility] Discharge Instructions for Patients Receiving Chemotherapy  Today you received the following chemotherapy agents etoposide.    If you develop nausea and vomiting that is not controlled by your nausea medication, call the clinic.   BELOW ARE SYMPTOMS THAT SHOULD BE REPORTED IMMEDIATELY:  *FEVER GREATER THAN 100.5 F  *CHILLS WITH OR WITHOUT FEVER  NAUSEA AND VOMITING THAT IS NOT CONTROLLED WITH YOUR NAUSEA MEDICATION  *UNUSUAL SHORTNESS OF BREATH  *UNUSUAL BRUISING OR BLEEDING  TENDERNESS IN MOUTH AND THROAT WITH OR WITHOUT PRESENCE OF ULCERS  *URINARY PROBLEMS  *BOWEL PROBLEMS  UNUSUAL RASH Items with * indicate a potential emergency and should be followed up as soon as possible.  Feel free to call the clinic should you have any questions or concerns. The clinic phone number is [phone].  Please show the Earth at check-in to the Emergency Department and triage nurse.

## 2018-03-08 NOTE — Progress Notes (Signed)
Patient tolerated treatment with no complaints voiced.  Port site clean and dry with good blood return noted before and after treatment.  No bruising or swelling noted at site.  Band aid applied.  VSs with discharge and left ambulatory with no s/s of distress noted.

## 2018-03-16 ENCOUNTER — Other Ambulatory Visit (HOSPITAL_COMMUNITY): Payer: Self-pay | Admitting: Hematology

## 2018-03-16 ENCOUNTER — Encounter (HOSPITAL_COMMUNITY): Payer: Self-pay | Admitting: *Deleted

## 2018-03-16 ENCOUNTER — Other Ambulatory Visit (HOSPITAL_COMMUNITY): Payer: Self-pay | Admitting: *Deleted

## 2018-03-16 DIAGNOSIS — C3490 Malignant neoplasm of unspecified part of unspecified bronchus or lung: Secondary | ICD-10-CM

## 2018-03-16 MED ORDER — LORAZEPAM 1 MG PO TABS: 1.0000 mg | ORAL_TABLET | Freq: Two times a day (BID) | ORAL | 0 refills | Status: DC

## 2018-03-17 ENCOUNTER — Encounter: Payer: Self-pay | Admitting: Internal Medicine

## 2018-03-24 ENCOUNTER — Inpatient Hospital Stay (HOSPITAL_COMMUNITY): Payer: Medicare Other

## 2018-03-24 DIAGNOSIS — Z720 Tobacco use: Secondary | ICD-10-CM | POA: Diagnosis not present

## 2018-03-24 DIAGNOSIS — Z5111 Encounter for antineoplastic chemotherapy: Secondary | ICD-10-CM | POA: Diagnosis not present

## 2018-03-24 DIAGNOSIS — R197 Diarrhea, unspecified: Secondary | ICD-10-CM | POA: Diagnosis not present

## 2018-03-24 DIAGNOSIS — C3431 Malignant neoplasm of lower lobe, right bronchus or lung: Secondary | ICD-10-CM | POA: Diagnosis not present

## 2018-03-24 DIAGNOSIS — C3490 Malignant neoplasm of unspecified part of unspecified bronchus or lung: Secondary | ICD-10-CM

## 2018-03-24 LAB — CBC WITH DIFFERENTIAL/PLATELET
Abs Immature Granulocytes: 0.04 10*3/uL (ref 0.00–0.07)
BASOS ABS: 0 10*3/uL (ref 0.0–0.1)
Basophils Relative: 0 %
Eosinophils Absolute: 0 10*3/uL (ref 0.0–0.5)
Eosinophils Relative: 1 %
HCT: 26.9 % — ABNORMAL LOW (ref 39.0–52.0)
Hemoglobin: 8.7 g/dL — ABNORMAL LOW (ref 13.0–17.0)
Immature Granulocytes: 1 %
Lymphocytes Relative: 13 %
Lymphs Abs: 0.6 10*3/uL — ABNORMAL LOW (ref 0.7–4.0)
MCH: 33.1 pg (ref 26.0–34.0)
MCHC: 32.3 g/dL (ref 30.0–36.0)
MCV: 102.3 fL — ABNORMAL HIGH (ref 80.0–100.0)
Monocytes Absolute: 0.9 10*3/uL (ref 0.1–1.0)
Monocytes Relative: 20 %
Neutro Abs: 3 10*3/uL (ref 1.7–7.7)
Neutrophils Relative %: 65 %
Platelets: 140 10*3/uL — ABNORMAL LOW (ref 150–400)
RBC: 2.63 MIL/uL — ABNORMAL LOW (ref 4.22–5.81)
RDW: 19.4 % — ABNORMAL HIGH (ref 11.5–15.5)
WBC: 4.6 10*3/uL (ref 4.0–10.5)
nRBC: 0 % (ref 0.0–0.2)

## 2018-03-24 LAB — MAGNESIUM: Magnesium: 1.9 mg/dL (ref 1.7–2.4)

## 2018-03-24 LAB — COMPREHENSIVE METABOLIC PANEL
ALT: 11 U/L (ref 0–44)
AST: 15 U/L (ref 15–41)
Albumin: 3.3 g/dL — ABNORMAL LOW (ref 3.5–5.0)
Alkaline Phosphatase: 63 U/L (ref 38–126)
Anion gap: 10 (ref 5–15)
BUN: 12 mg/dL (ref 8–23)
CO2: 25 mmol/L (ref 22–32)
Calcium: 8.5 mg/dL — ABNORMAL LOW (ref 8.9–10.3)
Chloride: 101 mmol/L (ref 98–111)
Creatinine, Ser: 0.84 mg/dL (ref 0.61–1.24)
Glucose, Bld: 139 mg/dL — ABNORMAL HIGH (ref 70–99)
Potassium: 3.4 mmol/L — ABNORMAL LOW (ref 3.5–5.1)
Sodium: 136 mmol/L (ref 135–145)
Total Bilirubin: 0.9 mg/dL (ref 0.3–1.2)
Total Protein: 7.1 g/dL (ref 6.5–8.1)

## 2018-03-27 ENCOUNTER — Ambulatory Visit (HOSPITAL_COMMUNITY)
Admission: RE | Admit: 2018-03-27 | Discharge: 2018-03-27 | Disposition: A | Discharge status: Home / Self Care | Payer: Medicare Other | Source: Ambulatory Visit | Attending: Nurse Practitioner | Admitting: Nurse Practitioner

## 2018-03-27 ENCOUNTER — Inpatient Hospital Stay (HOSPITAL_BASED_OUTPATIENT_CLINIC_OR_DEPARTMENT_OTHER): Payer: Medicare Other | Admitting: Hematology

## 2018-03-27 ENCOUNTER — Inpatient Hospital Stay (HOSPITAL_COMMUNITY): Payer: Medicare Other

## 2018-03-27 ENCOUNTER — Encounter (HOSPITAL_COMMUNITY): Payer: Self-pay | Admitting: Hematology

## 2018-03-27 ENCOUNTER — Other Ambulatory Visit (HOSPITAL_COMMUNITY): Payer: Medicare Other

## 2018-03-27 VITALS — BP 107/57 | HR 77 | Temp 97.6°F | Resp 18

## 2018-03-27 VITALS — BP 108/62 | HR 92 | Temp 97.8°F | Resp 20 | Wt 136.0 lb

## 2018-03-27 DIAGNOSIS — Z5111 Encounter for antineoplastic chemotherapy: Secondary | ICD-10-CM | POA: Diagnosis not present

## 2018-03-27 DIAGNOSIS — C3431 Malignant neoplasm of lower lobe, right bronchus or lung: Secondary | ICD-10-CM

## 2018-03-27 DIAGNOSIS — D649 Anemia, unspecified: Secondary | ICD-10-CM | POA: Diagnosis not present

## 2018-03-27 DIAGNOSIS — R634 Abnormal weight loss: Secondary | ICD-10-CM

## 2018-03-27 DIAGNOSIS — R0602 Shortness of breath: Secondary | ICD-10-CM | POA: Diagnosis not present

## 2018-03-27 DIAGNOSIS — Z720 Tobacco use: Secondary | ICD-10-CM | POA: Diagnosis not present

## 2018-03-27 DIAGNOSIS — R058 Other specified cough: Secondary | ICD-10-CM

## 2018-03-27 DIAGNOSIS — R05 Cough: Secondary | ICD-10-CM

## 2018-03-27 DIAGNOSIS — C3490 Malignant neoplasm of unspecified part of unspecified bronchus or lung: Secondary | ICD-10-CM

## 2018-03-27 DIAGNOSIS — R197 Diarrhea, unspecified: Secondary | ICD-10-CM | POA: Diagnosis not present

## 2018-03-27 MED ORDER — SODIUM CHLORIDE 0.9 % IV SOLN
Freq: Once | INTRAVENOUS | Status: AC
  Administered 2018-03-27: 12:00:00 via INTRAVENOUS
  Filled 2018-03-27: qty 5

## 2018-03-27 MED ORDER — SODIUM CHLORIDE 0.9 % IV SOLN
100.0000 mg/m2 | Freq: Once | INTRAVENOUS | Status: AC
  Administered 2018-03-27: 170 mg via INTRAVENOUS
  Filled 2018-03-27: qty 8.5

## 2018-03-27 MED ORDER — SODIUM CHLORIDE 0.9% FLUSH
10.0000 mL | INTRAVENOUS | Status: DC | PRN
  Administered 2018-03-27: 10 mL
  Filled 2018-03-27: qty 10

## 2018-03-27 MED ORDER — PALONOSETRON HCL INJECTION 0.25 MG/5ML
0.2500 mg | Freq: Once | INTRAVENOUS | Status: AC
  Administered 2018-03-27: 0.25 mg via INTRAVENOUS

## 2018-03-27 MED ORDER — SODIUM CHLORIDE 0.9 % IV SOLN
Freq: Once | INTRAVENOUS | Status: AC
  Administered 2018-03-27: 11:00:00 via INTRAVENOUS

## 2018-03-27 MED ORDER — SODIUM CHLORIDE 0.9 % IV SOLN
409.5000 mg | Freq: Once | INTRAVENOUS | Status: AC
  Administered 2018-03-27: 410 mg via INTRAVENOUS
  Filled 2018-03-27: qty 41

## 2018-03-27 MED ORDER — PALONOSETRON HCL INJECTION 0.25 MG/5ML
INTRAVENOUS | Status: AC
  Filled 2018-03-27: qty 5

## 2018-03-27 NOTE — Assessment & Plan Note (Signed)
1.  Limited stage small cell lung cancer: - He had a recent fall, CT of the abdomen and pelvis on 11/08/2017 showed irregular narrowing of the right lower lobe bronchus with associated right hilar adenopathy. - Subsequent PET CT scan on 11/28/2017 shows lesion at the inferior lobar bronchus with SUV of 5.5 with the right hilar hypermetabolic and ipsilateral mediastinal adenopathy including right paratracheal, subcarinal and several small prevascular lymph nodes which demonstrate only low-grade metabolic activity. - On 83/72/9021 bronchoscopy and EBUS biopsy done by Dr. Roxan Hockey shows small cell carcinoma of the right bronchus biopsy.  Lymph node aspirations were also consistent with small cell lung cancer. - Patient is a current active smoker, smokes 1 pack/day for 60+ years.  He also had a brief exposure to asbestos.  He is a [place] [employer] veteran, was not sure whether he was exposed to agent orange. - He lives by himself and is independent of ADLs and IADLs.  He had 50 pound weight loss since January and a EGD done in [place] was consistent with gastroparesis. - We talked about the normal prognosis of small cell lung cancer.  For patients with limited stage disease, medial survival usually ranges between 15 to 20 months and reported 5-year survival is about 10 to 13%. - MRI of the brain on 01/06/2018 did not show metastatic disease.  -Chemoradiation therapy started on 01/18/2018. -She finished radiation therapy on 03/08/2018. -Cycle 3 chemotherapy was on 03/06/2018. -He has mild anemia from bone marrow suppression from chemotherapy. -He also complained of cough predominantly at nighttime in the last 2 to 3 weeks.  He has occasional white expectoration.  Denies any fevers or chills. -We will obtain a chest x-ray today to evaluate the new cough. - He may proceed with last cycle of chemotherapy today.  I plan to arrange for PET CT scan in 3 to 4 weeks to evaluate response. -he will be a candidate for  prophylactic cranial irradiation if he shows complete or partial response.  2.  Weight loss: -He is eating up to 2 meals per day.  He stopped drinking boost as it causes diarrhea

## 2018-03-27 NOTE — Progress Notes (Signed)
Patient seen by oncologist with lab review and ok to treat today.   Patient tolerated chemotherapy with no complaints voiced.  Port site clean and dry with no bruising or swelling noted at site.  Good blood return noted before and after administration of chemotherapy.  dressing intact.  Patient left ambulatory with VSS and no s/s of distress noted.

## 2018-03-27 NOTE — Progress Notes (Signed)
[place], [place] [address]   CLINIC:  Medical Oncology/Hematology  PCP:  [facility], [place] [id number] [phone]   REASON FOR VISIT: Follow-up for limited stage small cell lung  CURRENT THERAPY:Chemotherapy.  BRIEF ONCOLOGIC HISTORY:    Small cell lung cancer ([place])   12/30/2017 Initial Diagnosis    Small cell lung cancer ([place])    01/18/2018 -  Chemotherapy    The patient had palonosetron (ALOXI) injection 0.25 mg, 0.25 mg, Intravenous,  Once, 3 of 4 cycles Administration: 0.25 mg (01/18/2018), 0.25 mg (02/08/2018), 0.25 mg (03/06/2018) CARBOplatin (PARAPLATIN) 300 mg in sodium chloride 0.9 % 250 mL chemo infusion, 300 mg (100 % of original dose 300 mg), Intravenous,  Once, 3 of 4 cycles Dose modification: 300 mg (original dose 300 mg, Cycle 1), 409.5 mg (original dose 409.5 mg, Cycle 2),   (original dose 409.5 mg, Cycle 3), 409.5 mg (Cycle 4) Administration: 300 mg (01/18/2018), 410 mg (02/08/2018), 410 mg (03/06/2018) etoposide (VEPESID) 160 mg in sodium chloride 0.9 % 500 mL chemo infusion, 90 mg/m2 = 160 mg (90 % of original dose 100 mg/m2), Intravenous,  Once, 3 of 4 cycles Dose modification: 90 mg/m2 (90 % of original dose 100 mg/m2, Cycle 1, Reason: Patient Age) Administration: 160 mg (01/18/2018), 170 mg (01/19/2018), 170 mg (02/10/2018), 170 mg (02/08/2018), 170 mg (02/09/2018), 170 mg (01/20/2018), 170 mg (03/07/2018), 170 mg (03/06/2018), 170 mg (03/08/2018) fosaprepitant (EMEND) 150 mg, dexamethasone (DECADRON) 12 mg in sodium chloride 0.9 % 145 mL IVPB, , Intravenous,  Once, 3 of 4 cycles Administration:  (01/18/2018),  (02/08/2018),  (03/06/2018)  for chemotherapy treatment.       INTERVAL HISTORY:  Mr. Balderston 78 y.o. male returns for routine follow-up for limited stage small cell lung. He is only eating 1-2 small meals a day. He is having problems with diarrhea after eating any foods.He has  developed a new cough but denies any pain or fevers. The sputum is white or clear. Denies any nausea or vomiting. Denies any new pains. Had not noticed any recent bleeding such as epistaxis, hematuria or hematochezia. Denies recent chest pain on exertion, shortness of breath on minimal exertion, pre-syncopal episodes, or palpitations. Denies any numbness or tingling in hands or feet. Denies any recent fevers, infections, or recent hospitalizations. Patient reports appetite at 25% and energy level at 25%.   REVIEW OF SYSTEMS:  Review of Systems  Constitutional: Positive for fatigue.  Respiratory: Positive for cough and shortness of breath.   Gastrointestinal: Positive for diarrhea.  Neurological: Positive for dizziness and extremity weakness.  All other systems reviewed and are negative.    PAST MEDICAL/SURGICAL HISTORY:  Past Medical History:  Diagnosis Date  . AAA (abdominal aortic aneurysm) ([place])   . Alternating constipation and diarrhea   . Arthritis   . Chronic back pain   . COPD (chronic obstructive pulmonary disease) ([place])   . Depression   . GERD (gastroesophageal reflux disease)   . History of hiatal hernia   . Hypertension    not on medications  . Hypothyroidism   . Lung cancer ([place])   . Peripheral vascular disease ([place])   . Smoker   . Thyroid disease    Past Surgical History:  Procedure Laterality Date  . ABDOMINAL AORTAGRAM N/A 03/14/2012   Procedure: ABDOMINAL Maxcine Ham;  Surgeon: Serafina Mitchell, MD;  Location: [facility] CATH LAB;  Service: Cardiovascular;  Laterality: N/A;  . ABDOMINAL AORTIC ANEURYSM  REPAIR  04-22-2009   Stent graft repair of AAA  . APPENDECTOMY    . BRONCHIAL BRUSHINGS Right 11/30/2017   Procedure: BRONCHIAL BRUSHINGS;  Surgeon: Sinda Du, MD;  Location: [facility] ENDO SUITE;  Service: Cardiopulmonary;  Laterality: Right;  . BRONCHIAL WASHINGS Right 11/30/2017   Procedure: BRONCHIAL WASHINGS;  Surgeon: Sinda Du, MD;  Location: [facility] ENDO SUITE;   Service: Cardiopulmonary;  Laterality: Right;  . BYPASS GRAFT POPLITEAL TO POPLITEAL  03/20/2012   Procedure: BYPASS GRAFT POPLITEAL TO POPLITEAL;  Surgeon: Elam Dutch, MD;  Location: [facility] [place];  Service: Vascular;  Laterality: Left;  ABOVE THE KNEE POPLITEAL ARTERY TO BELOW THE KNEE POPLITEAL ARTERY BYPASS GRAFT USING REVERSE LEFT GREATER SAPHENOUS VEIN   . ENDARTERECTOMY POPLITEAL  03/20/2012   Procedure: ENDARTERECTOMY POPLITEAL;  Surgeon: Elam Dutch, MD;  Location: [facility] OR;  Service: Vascular;  Laterality: Left;   LIGATION OF LEFT LEG POPLITEAL ANEURYSM  . FLEXIBLE BRONCHOSCOPY N/A 11/30/2017   Procedure: FLEXIBLE BRONCHOSCOPY;  Surgeon: Sinda Du, MD;  Location: [facility] ENDO SU[facility];  Service: Cardiopulmonary;  Laterality: N/A;  . INTRAOPERATIVE ARTERIOGRAM  03/20/2012   Procedure: INTRA OPERATIVE ARTERIOGRAM;  Surgeon: Elam Dutch, MD;  Location: [facility];  Service: Vascular;  Laterality: Left;  TIMES ONE  . PORTACATH PLACEMENT Left 01/06/2018   Procedure: INSERTION PORT-A-CATH;  Surgeon: Aviva Signs, MD;  Location: [facility] ORS;  Service: General;  Laterality: Left;  Marland Kitchen VASECTOMY  1982  . VIDEO BRONCHOSCOPY WITH ENDOBRONCHIAL ULTRASOUND N/A 12/23/2017   Procedure: VIDEO BRONCHOSCOPY WITH ENDOBRONCHIAL ULTRASOUND;  Surgeon: Melrose Nakayama, MD;  Location: [facility];  Service: Thoracic;  Laterality: N/A;     SOCIAL HISTORY:  Social History   Socioeconomic History  . Marital status: Divorced    Spouse name: Not on file  . Number of children: 2  . Years of education: Not on file  . Highest education level: Not on file  Occupational History  . Occupation: [place]     Comment: Agent Orange exposure  . Occupation: Sales executive    Comment: [place] exsposure  Social Needs  . Financial resource strain: Not hard at all  . Food insecurity:    Worry: Never true    Inability: Never true  . Transportation needs:    Medical: No    Non-medical: No  Tobacco Use  . Smoking status:  Current Every Day Smoker    Years: 62.00    Types: Cigars  . Smokeless tobacco: Never Used  . Tobacco comment: pt states he smokes about 15 cigars per day  Substance and Sexual Activity  . Alcohol use: No  . Drug use: No  . Sexual activity: Yes    Birth control/protection: None  Lifestyle  . Physical activity:    Days per week: 0 days    Minutes per session: 0 min  . Stress: To some extent  Relationships  . Social connections:    Talks on phone: More than three times a week    Gets together: Once a week    Attends religious service: More than 4 times per year    Active member of club or organization: No    Attends meetings of clubs or organizations: Never    Relationship status: Divorced  . Intimate partner violence:    Fear of current or ex partner: No    Emotionally abused: No    Physically abused: No    Forced sexual activity: No  Other Topics Concern  . Not on file  Social History Narrative   Sedentary    FAMILY HISTORY:  Family History  Problem Relation Age of Onset  . Diabetes Mother   . Stroke Mother   . Heart disease Father   . Diabetes Father   . Heart disease Sister        CABG x 4  . Cancer Brother        throat cancer  . Diabetes Sister   . Heart disease Sister   . Cancer Brother   . Kidney disease Son     CURRENT MEDICATIONS:  Outpatient Encounter Medications as of 03/27/2018  Medication Sig  . CARBOPLATIN IV Inject into the vein every 21 ( twenty-one) days.   . ETOPOSIDE IV Inject 170 mg into the vein every 21 ( twenty-one) days.   . Ipratropium-Albuterol (COMBIVENT RESPIMAT) 20-100 MCG/ACT AERS respimat Inhale 1 puff into the lungs every 6 (six) hours as needed for wheezing.  Marland Kitchen levothyroxine (SYNTHROID, LEVOTHROID) 137 MCG tablet Take 137 mcg by mouth daily before breakfast.   . lidocaine-prilocaine (EMLA) cream Apply small amount to port site and cover with plastic wrap one hour prior to appointment.  Marland Kitchen LORazepam (ATIVAN) 1 MG tablet Take 1  tablet (1 mg total) by mouth 2 (two) times daily.  . magic mouthwash w/lidocaine SOLN Take 10 mLs by mouth 4 (four) times daily.  . metoCLOPramide (REGLAN) 5 MG tablet Take 5 mg by mouth daily before lunch.  . mirtazapine (REMERON) 7.5 MG tablet Take 1 tablet (7.5 mg total) by mouth at bedtime.  Marland Kitchen omeprazole (PRILOSEC) 20 MG capsule Take 20 mg by mouth daily.  Marland Kitchen POTASSIUM PO Take 1 tablet by mouth daily.  . prochlorperazine (COMPAZINE) 10 MG tablet Take 1 tablet (10 mg total) by mouth every 6 (six) hours as needed (Nausea or vomiting).  . sertraline (ZOLOFT) 50 MG tablet Take 50 mg by mouth every morning.   . simvastatin (ZOCOR) 10 MG tablet Take 10 mg by mouth at bedtime.  . traMADol (ULTRAM) 50 MG tablet Take 1 tablet (50 mg total) by mouth every 6 (six) hours as needed.   No facility-administered encounter medications on file as of 03/27/2018.     ALLERGIES:  Allergies  Allergen Reactions  . Morphine And Related Shortness Of Breath and Other (See Comments)    Hallucinations pt has taken hydromorphone before     PHYSICAL EXAM:  ECOG Performance status: 1  Vitals:   03/27/18 0959  BP: 108/62  Pulse: 92  Resp: 20  Temp: 97.8 F (36.6 C)  SpO2: 100%   Filed Weights   03/27/18 0959  Weight: 136 lb (61.7 kg)    Physical Exam Constitutional:      Appearance: Normal appearance. He is normal weight.  Cardiovascular:     Rate and Rhythm: Normal rate and regular rhythm.     Heart sounds: Normal heart sounds.  Pulmonary:     Breath sounds: Rhonchi (at bases) present.  Musculoskeletal: Normal range of motion.  Skin:    General: Skin is warm and dry.  Neurological:     Mental Status: He is alert and oriented to person, place, and time. Mental status is at baseline.  Psychiatric:        Mood and Affect: Mood normal.        Behavior: Behavior normal.        Thought Content: Thought content normal.        Judgment: Judgment normal.      LABORATORY DATA:  I  have  reviewed the labs as listed.  CBC    Component Value Date/Time   WBC 4.6 03/24/2018 1014   RBC 2.63 (L) 03/24/2018 1014   HGB 8.7 (L) 03/24/2018 1014   HCT 26.9 (L) 03/24/2018 1014   PLT 140 (L) 03/24/2018 1014   MCV 102.3 (H) 03/24/2018 1014   MCH 33.1 03/24/2018 1014   MCHC 32.3 03/24/2018 1014   RDW 19.4 (H) 03/24/2018 1014   LYMPHSABS 0.6 (L) 03/24/2018 1014   MONOABS 0.9 03/24/2018 1014   EOSABS 0.0 03/24/2018 1014   BASOSABS 0.0 03/24/2018 1014   CMP Latest Ref Rng & Units 03/24/2018 03/03/2018 02/07/2018  Glucose 70 - 99 mg/dL 139(H) 111(H) 114(H)  BUN 8 - 23 mg/dL _0 Creatinine 0.61 - 1.24 mg/dL 0.84 0.81 0.86  Sodium 135 - 145 mmol/L 136 140 140  Potassium 3.5 - 5.1 mmol/L 3.4(L) 3.8 3.7  Chloride 98 - 111 mmol/L 101 105 105  CO2 22 - 32 mmol/L _1 Calcium 8.9 - 10.3 mg/dL 8.5(L) 8.3(L) 8.8(L)  Total Protein 6.5 - 8.1 g/dL 7.1 6.6 7.1  Total Bilirubin 0.3 - 1.2 mg/dL 0.9 0.5 0.7  Alkaline Phos 38 - 126 U/L 63 60 71  AST 15 - 41 U/L _2 ALT 0 - 44 U/L _3 DIAGNOSTIC IMAGING:  I have independently reviewed the scans and discussed with the patient.   I have reviewed Francene Finders, NP's note and agree with the documentation.  I personally performed a face-to-face visit, made revisions and my assessment and plan is as follows.    ASSESSMENT & PLAN:   Small cell lung cancer ([place]) 1.  Limited stage small cell lung cancer: - He had a recent fall, CT of the abdomen and pelvis on 11/08/2017 showed irregular narrowing of the right lower lobe bronchus with associated right hilar adenopathy. - Subsequent PET CT scan on 11/28/2017 shows lesion at the inferior lobar bronchus with SUV of 5.5 with the right hilar hypermetabolic and ipsilateral mediastinal adenopathy including right paratracheal, subcarinal and several small prevascular lymph nodes which demonstrate only low-grade metabolic activity. - On 26/71/2458 bronchoscopy and EBUS biopsy done  by Dr. Roxan Hockey shows small cell carcinoma of the right bronchus biopsy.  Lymph node aspirations were also consistent with small cell lung cancer. - Patient is a current active smoker, smokes 1 pack/day for 60+ years.  He also had a brief exposure to asbestos.  He is a [place] War veteran, was not sure whether he was exposed to agent orange. - He lives by himself and is independent of ADLs and IADLs.  He had 50 pound weight loss since January and a EGD done in [place] was consistent with gastroparesis. - We talked about the normal prognosis of small cell lung cancer.  For patients with limited stage disease, medial survival usually ranges between 15 to 20 months and reported 5-year survival is about 10 to 13%. - MRI of the brain on 01/06/2018 did not show metastatic disease.  -Chemoradiation therapy started on 01/18/2018. -She finished radiation therapy on 03/08/2018. -Cycle 3 chemotherapy was on 03/06/2018. -He has mild anemia from bone marrow suppression from chemotherapy. -He also complained of cough predominantly at nighttime in the last 2 to 3 weeks.  He has occasional white expectoration.  Denies any fevers or chills. -We will obtain a chest x-ray today to evaluate the new cough. - He may proceed with last cycle  of chemotherapy today.  I plan to arrange for PET CT scan in 3 to 4 weeks to evaluate response. -he will be a candidate for prophylactic cranial irradiation if he shows complete or partial response.  2.  Weight loss: -He is eating up to 2 meals per day.  He stopped drinking boost as it causes diarrhea      Orders placed this encounter:  Orders Placed This Encounter  Procedures  . DG Chest 2 View  . NM PET Image Restag (PS) Skull Base To Thigh      Derek Jack, MD [place] [phone]

## 2018-03-27 NOTE — Patient Instructions (Signed)
[facility] at [facility] Discharge Instructions     Thank you for choosing [facility] at [facility] to provide your oncology and hematology care.  To afford each patient quality time with our provider, please arrive at least 15 minutes before your scheduled appointment time.   If you have a lab appointment with the Cancer Center please come in thru the  Main Entrance and check in at the main information desk  You need to re-schedule your appointment should you arrive 10 or more minutes late.  We strive to give you quality time with our providers, and arriving late affects you and other patients whose appointments are after yours.  Also, if you no show three or more times for appointments you may be dismissed from the clinic at the providers discretion.     Again, thank you for choosing [facility].  Our hope is that these requests will decrease the amount of time that you wait before being seen by our physicians.       _____________________________________________________________  Should you have questions after your visit to [facility], please contact our office at [phone] between the hours of 8:00 a.m. and 4:30 p.m.  Voicemails left after 4:00 p.m. will not be returned until the following business day.  For prescription refill requests, have your pharmacy contact our office and allow 72 hours.    Cancer Center Support Programs:   > Cancer Support Group  2nd Tuesday of the month 1pm-2pm, Journey Room    

## 2018-03-27 NOTE — Patient Instructions (Signed)
[facility] Discharge Instructions for Patients Receiving Chemotherapy  Today you received the following chemotherapy agents  If you develop nausea and vomiting that is not controlled by your nausea medication, call the clinic.   BELOW ARE SYMPTOMS THAT SHOULD BE REPORTED IMMEDIATELY:  *FEVER GREATER THAN 100.5 F  *CHILLS WITH OR WITHOUT FEVER  NAUSEA AND VOMITING THAT IS NOT CONTROLLED WITH YOUR NAUSEA MEDICATION  *UNUSUAL SHORTNESS OF BREATH  *UNUSUAL BRUISING OR BLEEDING  TENDERNESS IN MOUTH AND THROAT WITH OR WITHOUT PRESENCE OF ULCERS  *URINARY PROBLEMS  *BOWEL PROBLEMS  UNUSUAL RASH Items with * indicate a potential emergency and should be followed up as soon as possible.  Feel free to call the clinic should you have any questions or concerns. The clinic phone number is [phone].  Please show the CHEMO ALERT CARD at check-in to the Emergency Department and triage nurse.   

## 2018-03-27 NOTE — Progress Notes (Signed)
Patient is in room 412 and ready for treatment. He will need a Chest x ray after prior to leaving the hospital. We have reviewed labs.

## 2018-03-28 ENCOUNTER — Inpatient Hospital Stay (HOSPITAL_COMMUNITY): Payer: Medicare Other | Attending: Hematology | Admitting: Dietician

## 2018-03-28 ENCOUNTER — Encounter (HOSPITAL_COMMUNITY): Payer: Self-pay

## 2018-03-28 ENCOUNTER — Inpatient Hospital Stay (HOSPITAL_COMMUNITY): Payer: Medicare Other

## 2018-03-28 VITALS — BP 113/57 | HR 85 | Temp 97.9°F | Resp 18 | Wt 138.2 lb

## 2018-03-28 DIAGNOSIS — Z5111 Encounter for antineoplastic chemotherapy: Secondary | ICD-10-CM | POA: Diagnosis not present

## 2018-03-28 DIAGNOSIS — C3490 Malignant neoplasm of unspecified part of unspecified bronchus or lung: Secondary | ICD-10-CM

## 2018-03-28 DIAGNOSIS — Z720 Tobacco use: Secondary | ICD-10-CM | POA: Diagnosis not present

## 2018-03-28 DIAGNOSIS — R197 Diarrhea, unspecified: Secondary | ICD-10-CM | POA: Diagnosis not present

## 2018-03-28 DIAGNOSIS — C3431 Malignant neoplasm of lower lobe, right bronchus or lung: Secondary | ICD-10-CM | POA: Diagnosis not present

## 2018-03-28 MED ORDER — SODIUM CHLORIDE 0.9% FLUSH
10.0000 mL | INTRAVENOUS | Status: DC | PRN
  Administered 2018-03-28: 10 mL
  Filled 2018-03-28: qty 10

## 2018-03-28 MED ORDER — SODIUM CHLORIDE 0.9 % IV SOLN
Freq: Once | INTRAVENOUS | Status: AC
  Administered 2018-03-28: 11:00:00 via INTRAVENOUS

## 2018-03-28 MED ORDER — SODIUM CHLORIDE 0.9 % IV SOLN
10.0000 mg | Freq: Once | INTRAVENOUS | Status: AC
  Administered 2018-03-28: 10 mg via INTRAVENOUS
  Filled 2018-03-28: qty 10

## 2018-03-28 MED ORDER — SODIUM CHLORIDE 0.9 % IV SOLN
100.0000 mg/m2 | Freq: Once | INTRAVENOUS | Status: AC
  Administered 2018-03-28: 170 mg via INTRAVENOUS
  Filled 2018-03-28: qty 8.5

## 2018-03-28 NOTE — Patient Instructions (Signed)
[facility] Discharge Instructions for Patients Receiving Chemotherapy   Beginning January 23rd 2017 lab work for the [facility] will be done in the  Main lab at [facility] on 1st floor. If you have a lab appointment with the [facility] please come in thru the  Main Entrance and check in at the main information desk   Today you received the following chemotherapy agents VP-16. Follow-up as scheduled. Call clinic for any questions or concerns  To help prevent nausea and vomiting after your treatment, we encourage you to take your nausea medication   If you develop nausea and vomiting, or diarrhea that is not controlled by your medication, call the clinic.  The clinic phone number is [phone]. Office hours are Monday-Friday 8:30am-5:00pm.  BELOW ARE SYMPTOMS THAT SHOULD BE REPORTED IMMEDIATELY:  *FEVER GREATER THAN 101.0 F  *CHILLS WITH OR WITHOUT FEVER  NAUSEA AND VOMITING THAT IS NOT CONTROLLED WITH YOUR NAUSEA MEDICATION  *UNUSUAL SHORTNESS OF BREATH  *UNUSUAL BRUISING OR BLEEDING  TENDERNESS IN MOUTH AND THROAT WITH OR WITHOUT PRESENCE OF ULCERS  *URINARY PROBLEMS  *BOWEL PROBLEMS  UNUSUAL RASH Items with * indicate a potential emergency and should be followed up as soon as possible. If you have an emergency after office hours please contact your primary care physician or go to the nearest emergency department.  Please call the clinic during office hours if you have any questions or concerns.   You may also contact the Patient Navigator at [phone] should you have any questions or need assistance in obtaining follow up care.      Resources For Cancer Patients and their Caregivers ? American Cancer Society: Can assist with transportation, wigs, general needs, runs Look Good Feel Better.        954-329-1305 ? Cancer Care: Provides financial assistance, online support groups, medication/co-pay assistance.  1-800-813-HOPE  6301042618) ? [place] Assists [place] cancer patients and their families through emotional , educational and financial support.  (859)737-5834 ? [place] DSS Where to apply for food stamps, Medicaid and utility assistance. 206-484-3912 ? [facility]: Transportation to medical appointments. (404)452-6685 ? Social Security Administration: May apply for disability if have a Stage IV cancer. ([phone] 980-663-2234 ? [employer], Disability and Transit Services: Assists with nutrition, care and transit needs. 434-798-4547

## 2018-03-28 NOTE — Progress Notes (Signed)
Asaph D Wadleigh tolerated VP-16 infusion well without complaints or incident.Port left accessed for use tomorrow VSS upon discharge. Pt discharged self ambulatory using cane in satisfactory condition accompanied by family members

## 2018-03-28 NOTE — Progress Notes (Signed)
Yesterday's CXR results reviewed with Dr. Delton Coombes and pt informed with understanding verbalized

## 2018-03-28 NOTE — Progress Notes (Signed)
Nutrition Follow-up 78 y/o male PMHx HTN, COPD, PVD, GERD, Depression. He is undergoing concomitant chemoradiation for SCLC.    Pt seen during infusion. He reports he generally has been doing well. He says his appetite has been good. He denies any n/v or further troubles chewing or swallowing. He has occasionally diarrhea and constipation. He does consume Ensure/Boost, but he says these cause diarrhea, so he tends to drink them only when he is feeling constipated.   RD took a quick diet recall. Yesterday for dinner, he had 2 slices of "Meat Lovers" Pizza from [facility] and half a box of lemon girl scout cookies. Later that night he had ~5 PB cracker. This morning, he had a moon pie and some chips.   His weight today is 138.2 lbs. This is at the lower end of his usual range; he has been 138-143 lbs since October.   He declines any further ensure supplementation, saying "naw, I got plenty".   He says his final chemotherapy tx is tomorrow.   Wt Readings from Last 10 Encounters:  03/28/18 138 lb 3.2 oz (62.7 kg)  03/27/18 136 lb (61.7 kg)  03/08/18 141 lb 12.1 oz (64.3 kg)  03/07/18 142 lb (64.4 kg)  03/06/18 137 lb 12.6 oz (62.5 kg)  02/10/18 142 lb 12.8 oz (64.8 kg)  02/08/18 138 lb (62.6 kg)  01/20/18 143 lb 12.8 oz (65.2 kg)  01/19/18 142 lb 6.7 oz (64.6 kg)  01/18/18 139 lb (63 kg)   MEDICATIONS:  Chemotherapy: Carboplatin, Etoposide Supportive medications: Ultram, ppi remeron, reglan, magic mouthwash, compazine, ativan  LABS: Glu: 139, Albumin: 3.3. Was 3.2 3 weeks ago  Recent Labs  Lab 03/24/18 1014  NA 136  K 3.4*  CL 101  CO2 25  BUN 12  CREATININE 0.84  CALCIUM 8.5*  MG 1.9  GLUCOSE 139*   ANTHROPOMETRICS: Height:  Ht Readings from Last 1 Encounters:  01/06/18 5' 8.5" (1.74 m)   Weight:  Wt Readings from Last 1 Encounters:  03/28/18 138 lb 3.2 oz (62.7 kg)   BMI:  BMI Readings from Last 1 Encounters:  03/28/18 20.71 kg/m   UBW: 180-190 Weight changes:  Wt stable between 137-143 since October.    ESTIMATED ENERGY NEEDS:  Kcal: 1900-2050 kcals (30-33 kcal/kg bw) Protein: 82-94g Pro (1.3-1.5 g/kg bw) Fluid: >1.9 L (30 ml/kg bw)  NUTRITION DIAGNOSIS:  Increased protein/kcal needs related to cancer and cancer related therapies  DOCUMENTATION CODES:  Not applicable.  INTERVENTION:  RD pointed out that his breakfast this morning was lacking a protein source- again emphasized the importance of adequate protein intake during treatment.   Overall, pt seems to be doing well. His weight is stable and he reports a good appetite. He has had no further issues swallowing and little to no side effects from tx.  His last tx is purportedly tomorrow. No further follow up warranted at this time.   GOAL:  Oral intake to meet >90% of needs, wt stability.   Both met   MONITOR:  Oral intake, weights, tolerance to chemoradiation, labs, imaging  Next Visit:  As needed.   Burtis Junes RD, LDN, CNSC Clinical Nutrition Available Tues-Sat via Pager: [phone] 03/28/2018 11:21 AM

## 2018-03-29 ENCOUNTER — Inpatient Hospital Stay (HOSPITAL_COMMUNITY): Payer: Medicare Other

## 2018-03-29 VITALS — BP 103/51 | HR 53 | Temp 97.8°F | Resp 18 | Wt 142.2 lb

## 2018-03-29 DIAGNOSIS — R197 Diarrhea, unspecified: Secondary | ICD-10-CM | POA: Diagnosis not present

## 2018-03-29 DIAGNOSIS — C3431 Malignant neoplasm of lower lobe, right bronchus or lung: Secondary | ICD-10-CM | POA: Diagnosis not present

## 2018-03-29 DIAGNOSIS — Z720 Tobacco use: Secondary | ICD-10-CM | POA: Diagnosis not present

## 2018-03-29 DIAGNOSIS — Z5111 Encounter for antineoplastic chemotherapy: Secondary | ICD-10-CM | POA: Diagnosis not present

## 2018-03-29 DIAGNOSIS — C3490 Malignant neoplasm of unspecified part of unspecified bronchus or lung: Secondary | ICD-10-CM

## 2018-03-29 MED ORDER — HEPARIN SOD (PORK) LOCK FLUSH 100 UNIT/ML IV SOLN
500.0000 [IU] | Freq: Once | INTRAVENOUS | Status: AC | PRN
  Administered 2018-03-29: 500 [IU]

## 2018-03-29 MED ORDER — SODIUM CHLORIDE 0.9% FLUSH
10.0000 mL | INTRAVENOUS | Status: DC | PRN
  Administered 2018-03-29: 10 mL
  Filled 2018-03-29: qty 10

## 2018-03-29 MED ORDER — SODIUM CHLORIDE 0.9 % IV SOLN
100.0000 mg/m2 | Freq: Once | INTRAVENOUS | Status: AC
  Administered 2018-03-29: 170 mg via INTRAVENOUS
  Filled 2018-03-29: qty 8.5

## 2018-03-29 MED ORDER — SODIUM CHLORIDE 0.9 % IV SOLN
Freq: Once | INTRAVENOUS | Status: AC
  Administered 2018-03-29: 11:00:00 via INTRAVENOUS

## 2018-03-29 MED ORDER — SODIUM CHLORIDE 0.9 % IV SOLN
10.0000 mg | Freq: Once | INTRAVENOUS | Status: AC
  Administered 2018-03-29: 10 mg via INTRAVENOUS
  Filled 2018-03-29: qty 10

## 2018-03-29 NOTE — Progress Notes (Signed)
Patient here today for day 3 of cycle 4. No new issues noted, proceed with treatment today per parameters.   Treatment given per orders. Patient tolerated it well without problems. Vitals stable and discharged home from clinic ambulatory. Follow up as scheduled.

## 2018-03-29 NOTE — Patient Instructions (Signed)
[facility] Discharge Instructions for Patients Receiving Chemotherapy  Today you received the following chemotherapy agents   To help prevent nausea and vomiting after your treatment, we encourage you to take your nausea medication   If you develop nausea and vomiting that is not controlled by your nausea medication, call the clinic.   BELOW ARE SYMPTOMS THAT SHOULD BE REPORTED IMMEDIATELY:  *FEVER GREATER THAN 100.5 F  *CHILLS WITH OR WITHOUT FEVER  NAUSEA AND VOMITING THAT IS NOT CONTROLLED WITH YOUR NAUSEA MEDICATION  *UNUSUAL SHORTNESS OF BREATH  *UNUSUAL BRUISING OR BLEEDING  TENDERNESS IN MOUTH AND THROAT WITH OR WITHOUT PRESENCE OF ULCERS  *URINARY PROBLEMS  *BOWEL PROBLEMS  UNUSUAL RASH Items with * indicate a potential emergency and should be followed up as soon as possible.  Feel free to call the clinic should you have any questions or concerns. The clinic phone number is [phone].  Please show the CHEMO ALERT CARD at check-in to the Emergency Department and triage nurse.   

## 2018-04-05 ENCOUNTER — Other Ambulatory Visit (HOSPITAL_COMMUNITY): Payer: Self-pay | Admitting: *Deleted

## 2018-04-05 ENCOUNTER — Encounter (HOSPITAL_COMMUNITY): Payer: Self-pay | Admitting: *Deleted

## 2018-04-05 NOTE — Progress Notes (Signed)
Patient called clinic stating that he has had extreme fatigue and weakness accompanied by shortness of breath since last week with each day getting worse.  Patient was scheduled for labs and IV fluids tomorrow.

## 2018-04-06 ENCOUNTER — Inpatient Hospital Stay (HOSPITAL_COMMUNITY): Payer: Medicare Other

## 2018-04-06 ENCOUNTER — Inpatient Hospital Stay (HOSPITAL_COMMUNITY): Payer: Medicare Other | Attending: Hematology

## 2018-04-06 ENCOUNTER — Ambulatory Visit (HOSPITAL_COMMUNITY)
Admission: RE | Admit: 2018-04-06 | Discharge: 2018-04-06 | Disposition: A | Discharge status: Home / Self Care | Payer: Medicare Other | Source: Ambulatory Visit | Attending: Nurse Practitioner | Admitting: Nurse Practitioner

## 2018-04-06 ENCOUNTER — Encounter (HOSPITAL_COMMUNITY): Payer: Self-pay

## 2018-04-06 VITALS — BP 112/50 | HR 72 | Temp 97.7°F | Resp 18 | Wt 132.7 lb

## 2018-04-06 DIAGNOSIS — R531 Weakness: Secondary | ICD-10-CM | POA: Diagnosis not present

## 2018-04-06 DIAGNOSIS — C3431 Malignant neoplasm of lower lobe, right bronchus or lung: Secondary | ICD-10-CM | POA: Insufficient documentation

## 2018-04-06 DIAGNOSIS — R634 Abnormal weight loss: Secondary | ICD-10-CM | POA: Insufficient documentation

## 2018-04-06 DIAGNOSIS — R05 Cough: Secondary | ICD-10-CM

## 2018-04-06 DIAGNOSIS — R058 Other specified cough: Secondary | ICD-10-CM

## 2018-04-06 DIAGNOSIS — Z720 Tobacco use: Secondary | ICD-10-CM | POA: Insufficient documentation

## 2018-04-06 DIAGNOSIS — D649 Anemia, unspecified: Secondary | ICD-10-CM | POA: Insufficient documentation

## 2018-04-06 DIAGNOSIS — R0602 Shortness of breath: Secondary | ICD-10-CM

## 2018-04-06 DIAGNOSIS — C3490 Malignant neoplasm of unspecified part of unspecified bronchus or lung: Secondary | ICD-10-CM

## 2018-04-06 LAB — ABO/RH: ABO/RH(D): A POS

## 2018-04-06 LAB — COMPREHENSIVE METABOLIC PANEL
ALK PHOS: 66 U/L (ref 38–126)
ALT: 15 U/L (ref 0–44)
AST: 15 U/L (ref 15–41)
Albumin: 3.6 g/dL (ref 3.5–5.0)
Anion gap: 7 (ref 5–15)
BUN: 16 mg/dL (ref 8–23)
CO2: 26 mmol/L (ref 22–32)
CREATININE: 0.75 mg/dL (ref 0.61–1.24)
Calcium: 8.8 mg/dL — ABNORMAL LOW (ref 8.9–10.3)
Chloride: 104 mmol/L (ref 98–111)
GFR calc Af Amer: >60 mL/min (ref >60)
GFR calc non Af Amer: >60 mL/min (ref >60)
Glucose, Bld: 126 mg/dL — ABNORMAL HIGH (ref 70–99)
Potassium: 4.2 mmol/L (ref 3.5–5.1)
Sodium: 137 mmol/L (ref 135–145)
Total Bilirubin: 0.5 mg/dL (ref 0.3–1.2)
Total Protein: 7.3 g/dL (ref 6.5–8.1)

## 2018-04-06 LAB — CBC WITH DIFFERENTIAL/PLATELET
ABS IMMATURE GRANULOCYTES: 0.01 10*3/uL (ref 0.00–0.07)
Basophils Absolute: 0 10*3/uL (ref 0.0–0.1)
Basophils Relative: 1 %
Eosinophils Absolute: 0 10*3/uL (ref 0.0–0.5)
Eosinophils Relative: 1 %
HCT: 23.5 % — ABNORMAL LOW (ref 39.0–52.0)
Hemoglobin: 7.5 g/dL — ABNORMAL LOW (ref 13.0–17.0)
Immature Granulocytes: 1 %
LYMPHS ABS: 0.5 10*3/uL — AB (ref 0.7–4.0)
Lymphocytes Relative: 36 %
MCH: 32.5 pg (ref 26.0–34.0)
MCHC: 31.9 g/dL (ref 30.0–36.0)
MCV: 101.7 fL — AB (ref 80.0–100.0)
MONOS PCT: 12 %
Monocytes Absolute: 0.2 10*3/uL (ref 0.1–1.0)
Neutro Abs: 0.7 10*3/uL — ABNORMAL LOW (ref 1.7–7.7)
Neutrophils Relative %: 49 %
Platelets: 86 10*3/uL — ABNORMAL LOW (ref 150–400)
RBC: 2.31 MIL/uL — ABNORMAL LOW (ref 4.22–5.81)
RDW: 16.6 % — ABNORMAL HIGH (ref 11.5–15.5)
WBC: 1.5 10*3/uL — ABNORMAL LOW (ref 4.0–10.5)
nRBC: 0 % (ref 0.0–0.2)

## 2018-04-06 LAB — PREPARE RBC (CROSSMATCH)

## 2018-04-06 LAB — SAMPLE TO BLOOD BANK

## 2018-04-06 MED ORDER — SODIUM CHLORIDE 0.9 % IV SOLN
INTRAVENOUS | Status: DC
  Administered 2018-04-06: 11:00:00 via INTRAVENOUS

## 2018-04-06 MED ORDER — ACETAMINOPHEN 325 MG PO TABS
650.0000 mg | ORAL_TABLET | Freq: Once | ORAL | Status: AC
  Administered 2018-04-06: 650 mg via ORAL
  Filled 2018-04-06: qty 2

## 2018-04-06 MED ORDER — HEPARIN SOD (PORK) LOCK FLUSH 100 UNIT/ML IV SOLN
500.0000 [IU] | Freq: Every day | INTRAVENOUS | Status: AC | PRN
  Administered 2018-04-06: 500 [IU]
  Filled 2018-04-06: qty 5

## 2018-04-06 MED ORDER — DIPHENHYDRAMINE HCL 25 MG PO CAPS
25.0000 mg | ORAL_CAPSULE | Freq: Once | ORAL | Status: AC
  Administered 2018-04-06: 25 mg via ORAL
  Filled 2018-04-06: qty 1

## 2018-04-06 MED ORDER — IPRATROPIUM-ALBUTEROL 0.5-2.5 (3) MG/3ML IN SOLN
RESPIRATORY_TRACT | Status: AC
  Filled 2018-04-06: qty 3

## 2018-04-06 MED ORDER — IPRATROPIUM-ALBUTEROL 0.5-2.5 (3) MG/3ML IN SOLN
3.0000 mL | Freq: Four times a day (QID) | RESPIRATORY_TRACT | Status: DC
  Administered 2018-04-06: 3 mL via RESPIRATORY_TRACT

## 2018-04-06 MED ORDER — SODIUM CHLORIDE 0.9% FLUSH
10.0000 mL | INTRAVENOUS | Status: AC | PRN
  Administered 2018-04-06: 10 mL

## 2018-04-06 MED ORDER — SODIUM CHLORIDE 0.9% IV SOLUTION
250.0000 mL | Freq: Once | INTRAVENOUS | Status: AC
  Administered 2018-04-06: 250 mL via INTRAVENOUS

## 2018-04-06 MED ORDER — SODIUM CHLORIDE 0.9 % IV SOLN: INTRAVENOUS | Status: AC

## 2018-04-06 NOTE — Patient Instructions (Signed)
[facility] at [facility]  Discharge Instructions:   _______________________________________________________________  Thank you for choosing [facility] at [facility] to provide your oncology and hematology care.  To afford each patient quality time with our providers, please arrive at least 15 minutes before your scheduled appointment.  You need to re-schedule your appointment if you arrive 10 or more minutes late.  We strive to give you quality time with our providers, and arriving late affects you and other patients whose appointments are after yours.  Also, if you no show three or more times for appointments you may be dismissed from the clinic.  Again, thank you for choosing [facility] at [facility]. Our hope is that these requests will allow you access to exceptional care and in a timely manner. _______________________________________________________________  If you have questions after your visit, please contact our office at [phone] between the hours of 8:30 a.m. and 5:00 p.m. Voicemails left after 4:30 p.m. will not be returned until the following business day. _______________________________________________________________  For prescription refill requests, have your pharmacy contact our office. _______________________________________________________________  Recommendations made by the consultant and any test results will be sent to your referring physician. _______________________________________________________________ 

## 2018-04-07 ENCOUNTER — Telehealth (HOSPITAL_COMMUNITY): Payer: Self-pay

## 2018-04-07 LAB — TYPE AND SCREEN
ABO/RH(D): A POS
Antibody Screen: NEGATIVE
UNIT DIVISION: 0
Unit division: 0

## 2018-04-07 LAB — BPAM RBC
Blood Product Expiration Date: 202003032359
Blood Product Expiration Date: 202003032359
ISSUE DATE / TIME: 202002061223
ISSUE DATE / TIME: 202002061403
UNIT TYPE AND RH: 6200
Unit Type and Rh: 6200

## 2018-04-07 NOTE — Telephone Encounter (Signed)
Called patient to let him know his chest xray was clear. Patient stated he felt good today, SOB was better and he had more energy.

## 2018-04-07 NOTE — Progress Notes (Signed)
Late entry: patient presented today not having any energy, very SOB , tired all the time. States he has no appetite, cannot walk far without stopping and catching his breath. He denies fever, does have chills, lost 10lbs since last visit.   Labs drawn and reviewed by MD. Hemoglobin 7.6. will given two units of blood and 529ml of saline per orders.  Treatment given per orders. Patient tolerated it well without problems. Vitals stable and discharged home from clinic ambulatory. Follow up as scheduled.

## 2018-04-12 DIAGNOSIS — C3401 Malignant neoplasm of right main bronchus: Secondary | ICD-10-CM | POA: Diagnosis not present

## 2018-04-13 ENCOUNTER — Other Ambulatory Visit (HOSPITAL_COMMUNITY): Payer: Self-pay | Admitting: *Deleted

## 2018-04-13 ENCOUNTER — Encounter (HOSPITAL_COMMUNITY)
Admission: RE | Admit: 2018-04-13 | Discharge: 2018-04-13 | Disposition: A | Discharge status: Home / Self Care | Payer: Medicare Other | Source: Ambulatory Visit | Attending: Nurse Practitioner | Admitting: Nurse Practitioner

## 2018-04-13 DIAGNOSIS — C3490 Malignant neoplasm of unspecified part of unspecified bronchus or lung: Secondary | ICD-10-CM | POA: Diagnosis not present

## 2018-04-13 LAB — GLUCOSE, CAPILLARY: Glucose-Capillary: 107 mg/dL — ABNORMAL HIGH (ref 70–99)

## 2018-04-13 MED ORDER — FLUDEOXYGLUCOSE F - 18 (FDG) INJECTION
6.8000 | Freq: Once | INTRAVENOUS | Status: AC
  Administered 2018-04-13: 6.8 via INTRAVENOUS

## 2018-04-13 MED ORDER — LORAZEPAM 1 MG PO TABS: 1.0000 mg | ORAL_TABLET | Freq: Two times a day (BID) | ORAL | 0 refills | Status: DC

## 2018-04-14 ENCOUNTER — Other Ambulatory Visit (HOSPITAL_COMMUNITY): Payer: Self-pay | Admitting: *Deleted

## 2018-04-14 ENCOUNTER — Inpatient Hospital Stay (HOSPITAL_COMMUNITY): Payer: Medicare Other

## 2018-04-14 DIAGNOSIS — D649 Anemia, unspecified: Secondary | ICD-10-CM | POA: Diagnosis not present

## 2018-04-14 DIAGNOSIS — R05 Cough: Secondary | ICD-10-CM | POA: Diagnosis not present

## 2018-04-14 DIAGNOSIS — C3490 Malignant neoplasm of unspecified part of unspecified bronchus or lung: Secondary | ICD-10-CM

## 2018-04-14 DIAGNOSIS — R531 Weakness: Secondary | ICD-10-CM | POA: Diagnosis not present

## 2018-04-14 DIAGNOSIS — Z720 Tobacco use: Secondary | ICD-10-CM | POA: Diagnosis not present

## 2018-04-14 DIAGNOSIS — C3431 Malignant neoplasm of lower lobe, right bronchus or lung: Secondary | ICD-10-CM | POA: Diagnosis not present

## 2018-04-14 LAB — CBC WITH DIFFERENTIAL/PLATELET
Abs Immature Granulocytes: 0.06 10*3/uL (ref 0.00–0.07)
Basophils Absolute: 0 10*3/uL (ref 0.0–0.1)
Basophils Relative: 0 %
Eosinophils Absolute: 0 10*3/uL (ref 0.0–0.5)
Eosinophils Relative: 0 %
HCT: 29.8 % — ABNORMAL LOW (ref 39.0–52.0)
Hemoglobin: 9.5 g/dL — ABNORMAL LOW (ref 13.0–17.0)
Immature Granulocytes: 1 %
Lymphocytes Relative: 11 %
Lymphs Abs: 0.7 10*3/uL (ref 0.7–4.0)
MCH: 32.1 pg (ref 26.0–34.0)
MCHC: 31.9 g/dL (ref 30.0–36.0)
MCV: 100.7 fL — ABNORMAL HIGH (ref 80.0–100.0)
MONO ABS: 1.4 10*3/uL — AB (ref 0.1–1.0)
Monocytes Relative: 20 %
NEUTROS ABS: 4.8 10*3/uL (ref 1.7–7.7)
Neutrophils Relative %: 68 %
Platelets: 141 10*3/uL — ABNORMAL LOW (ref 150–400)
RBC: 2.96 MIL/uL — ABNORMAL LOW (ref 4.22–5.81)
RDW: 18.5 % — ABNORMAL HIGH (ref 11.5–15.5)
WBC: 7 10*3/uL (ref 4.0–10.5)
nRBC: 0 % (ref 0.0–0.2)

## 2018-04-14 LAB — COMPREHENSIVE METABOLIC PANEL
ALK PHOS: 65 U/L (ref 38–126)
ALT: 17 U/L (ref 0–44)
AST: 15 U/L (ref 15–41)
Albumin: 3 g/dL — ABNORMAL LOW (ref 3.5–5.0)
Anion gap: 9 (ref 5–15)
BUN: 12 mg/dL (ref 8–23)
CO2: 28 mmol/L (ref 22–32)
CREATININE: 0.69 mg/dL (ref 0.61–1.24)
Calcium: 8.1 mg/dL — ABNORMAL LOW (ref 8.9–10.3)
Chloride: 99 mmol/L (ref 98–111)
GFR calc Af Amer: >60 mL/min (ref >60)
GFR calc non Af Amer: >60 mL/min (ref >60)
Glucose, Bld: 117 mg/dL — ABNORMAL HIGH (ref 70–99)
Potassium: 3.6 mmol/L (ref 3.5–5.1)
Sodium: 136 mmol/L (ref 135–145)
Total Bilirubin: 0.5 mg/dL (ref 0.3–1.2)
Total Protein: 6.9 g/dL (ref 6.5–8.1)

## 2018-04-17 ENCOUNTER — Inpatient Hospital Stay (HOSPITAL_BASED_OUTPATIENT_CLINIC_OR_DEPARTMENT_OTHER): Payer: Medicare Other | Admitting: Hematology

## 2018-04-17 ENCOUNTER — Inpatient Hospital Stay (HOSPITAL_COMMUNITY): Payer: Medicare Other

## 2018-04-17 ENCOUNTER — Encounter (HOSPITAL_COMMUNITY): Payer: Self-pay | Admitting: Hematology

## 2018-04-17 ENCOUNTER — Other Ambulatory Visit: Payer: Self-pay

## 2018-04-17 VITALS — BP 106/59 | HR 98 | Temp 99.2°F | Resp 18 | Wt 135.0 lb

## 2018-04-17 DIAGNOSIS — D649 Anemia, unspecified: Secondary | ICD-10-CM | POA: Diagnosis not present

## 2018-04-17 DIAGNOSIS — R531 Weakness: Secondary | ICD-10-CM | POA: Diagnosis not present

## 2018-04-17 DIAGNOSIS — R634 Abnormal weight loss: Secondary | ICD-10-CM | POA: Diagnosis not present

## 2018-04-17 DIAGNOSIS — Z720 Tobacco use: Secondary | ICD-10-CM | POA: Diagnosis not present

## 2018-04-17 DIAGNOSIS — R05 Cough: Secondary | ICD-10-CM | POA: Diagnosis not present

## 2018-04-17 DIAGNOSIS — C3431 Malignant neoplasm of lower lobe, right bronchus or lung: Secondary | ICD-10-CM | POA: Diagnosis not present

## 2018-04-17 DIAGNOSIS — C3490 Malignant neoplasm of unspecified part of unspecified bronchus or lung: Secondary | ICD-10-CM

## 2018-04-17 MED ORDER — LEVOFLOXACIN 500 MG PO TABS: 500.0000 mg | ORAL_TABLET | Freq: Every day | ORAL | 0 refills | Status: DC

## 2018-04-17 NOTE — Assessment & Plan Note (Addendum)
1.  Limited stage small cell lung cancer: - CT abdomen pelvis on 11/08/2017 showed irregular narrowing of the right lower lobe bronchus associated with right hilar adenopathy.  - PET CT scan on 11/28/2017 shows lesion at the inferior lobar bronchus with SUV of 5.5 with right hilar hypermetabolic and ipsilateral mediastinal adenopathy including right paratracheal, subcarinal and several small prevascular lymph nodes which demonstrate only low-grade metabolic activity.  - Bronchoscopy and EVIS biopsy on 12/23/2017 shows small cell carcinoma.  Lymph node aspirations were also consistent with small cell lung cancer.  - Patient is a current active smoker, smokes 1 pack/day for 60+ years.  He also had a brief exposure to asbestos.  He is a [place] [employer] veteran, was not sure whether he was exposed to agent orange. - MRI of the brain on 01/06/2018 was negative for metastatic disease. -Chemoradiation therapy with carboplatin and etoposide 4 cycles from 01/18/2018 through 03/27/2018.   - He had severe weakness after cycle 4 for which he required 2 units of blood transfusion. - He still feeling weak.  He has cough with whitish expectoration. - We reviewed the results of the PET CT scan from 04/13/2018.  Right paratracheal lymph node measures 1.1 cm with SUV of 4.7.  There is plugging of the bronchus intermedius with peribronchovascular thickening and density in the right hilar and infrahilar region with associated accentuated activity.  There is a new activity in the L1 vertebral body without appreciable CT lesion.  Adenopathy in the chest has improved. - Overall I think he has gotten at least a partial response.  Some of the increased uptake in the right hilar region could be related to infection.  I will give a trial of Levaquin for 7 days.  For his L1 vertebral body lesion, I have recommended MRI of the lumbar spine with and without contrast. -He has an appointment with Dr.Yanagihara for MRI of the brain.  He will be a  candidate for PCI. -Given the findings on the current PET CT scan, I have recommended repeating another scan in 2 months. -I will see him back in 2 to 3 weeks to discuss the results.

## 2018-04-17 NOTE — Progress Notes (Signed)
Per Dr. Delton Coombes, pt will not be receiving any more chemotherapy at this time. Pt discharged in wheelchair in company of family.

## 2018-04-17 NOTE — Progress Notes (Signed)
Terry Boyd, [place] [address]   CLINIC:  Medical Oncology/Hematology  PCP:  Roy Stanton [phone] [phone]   REASON FOR VISIT: Follow-up for limited stage small cell lung  CURRENT THERAPY:Chemotherapy and radiation  BRIEF ONCOLOGIC HISTORY:    Small cell lung cancer ([place])   12/30/2017 Initial Diagnosis    Small cell lung cancer ([place])    01/18/2018 -  Chemotherapy    The patient had palonosetron (ALOXI) injection 0.25 mg, 0.25 mg, Intravenous,  Once, 4 of 4 cycles Administration: 0.25 mg (01/18/2018), 0.25 mg (02/08/2018), 0.25 mg (03/06/2018), 0.25 mg (03/27/2018) CARBOplatin (PARAPLATIN) 300 mg in sodium chloride 0.9 % 250 mL chemo infusion, 300 mg (100 % of original dose 300 mg), Intravenous,  Once, 4 of 4 cycles Dose modification: 300 mg (original dose 300 mg, Cycle 1), 409.5 mg (original dose 409.5 mg, Cycle 2),   (original dose 409.5 mg, Cycle 3), 409.5 mg (original dose 409.5 mg, Cycle 4) Administration: 300 mg (01/18/2018), 410 mg (02/08/2018), 410 mg (03/06/2018), 410 mg (03/27/2018) etoposide (VEPESID) 160 mg in sodium chloride 0.9 % 500 mL chemo infusion, 90 mg/m2 = 160 mg (90 % of original dose 100 mg/m2), Intravenous,  Once, 4 of 4 cycles Dose modification: 90 mg/m2 (90 % of original dose 100 mg/m2, Cycle 1, Reason: Patient Age) Administration: 160 mg (01/18/2018), 170 mg (01/19/2018), 170 mg (02/10/2018), 170 mg (02/08/2018), 170 mg (02/09/2018), 170 mg (01/20/2018), 170 mg (03/07/2018), 170 mg (03/06/2018), 170 mg (03/08/2018), 170 mg (03/27/2018), 170 mg (03/28/2018), 170 mg (03/29/2018) fosaprepitant (EMEND) 150 mg, dexamethasone (DECADRON) 12 mg in sodium chloride 0.9 % 145 mL IVPB, , Intravenous,  Once, 4 of 4 cycles Administration:  (01/18/2018),  (02/08/2018),  (03/06/2018),  (03/27/2018)  for chemotherapy treatment.      INTERVAL HISTORY:  Terry Boyd 78 y.o. male returns for routine follow-up  for limited stage small cell lung cancer. He is here today with his daughter. He reports he is not doing well. He has been in the bed sleeping a lot. His appetite is decreased. He has lost 7 pound in the last few weeks. He still has the same cough. He denies it being productive. Denies any nausea, vomiting, or diarrhea. Denies any new pains. Had not noticed any recent bleeding such as epistaxis, hematuria or hematochezia. Denies recent chest pain on exertion, shortness of breath on minimal exertion, pre-syncopal episodes, or palpitations. Denies any numbness or tingling in hands or feet. Denies any recent fevers, infections, or recent hospitalizations. Patient reports appetite at 0% and energy level at 0%. He is unable to drink the boost or ensure due to giving him diarrhea.      REVIEW OF SYSTEMS:  Review of Systems  Constitutional: Positive for fatigue.  Respiratory: Positive for cough and shortness of breath.   All other systems reviewed and are negative.    PAST MEDICAL/SURGICAL HISTORY:  Past Medical History:  Diagnosis Date  . AAA (abdominal aortic aneurysm) ([place])   . Alternating constipation and diarrhea   . Arthritis   . Chronic back pain   . COPD (chronic obstructive pulmonary disease) ([place])   . Depression   . GERD (gastroesophageal reflux disease)   . History of hiatal hernia   . Hypertension    not on medications  . Hypothyroidism   . Lung cancer ([place])   . Peripheral vascular disease ([place])   . Smoker   . Thyroid disease  Past Surgical History:  Procedure Laterality Date  . ABDOMINAL AORTAGRAM N/A 03/14/2012   Procedure: ABDOMINAL Maxcine Ham;  Surgeon: Serafina Mitchell, MD;  Location: [facility] CATH LAB;  Service: Cardiovascular;  Laterality: N/A;  . ABDOMINAL AORTIC ANEURYSM REPAIR  04-22-2009   Stent graft repair of AAA  . APPENDECTOMY    . BRONCHIAL BRUSHINGS Right 11/30/2017   Procedure: BRONCHIAL BRUSHINGS;  Surgeon: Sinda Du, MD;  [facility] ENDO SU[facility];  Service:  Cardiopulmonary;  Laterality: Right;  . BRONCHIAL WASHINGS Right 11/30/2017   Procedure: BRONCHIAL WASHINGS;  Surgeon: Sinda Du, MD;  [facility] ENDO [facility];  Service: Cardiopulmonary;  Laterality: Right;  . BYPASS GRAFT POPLITEAL TO POPLITEAL  03/20/2012   Procedure: BYPASS GRAFT POPLITEAL TO POPLITEAL;  Surgeon: Elam Dutch, MD;  Location: [facility];  Service: Vascular;  Laterality: Left;  ABOVE THE KNEE POPLITEAL ARTERY TO BELOW THE KNEE POPLITEAL ARTERY BYPASS GRAFT USING REVERSE LEFT GREATER SAPHENOUS VEIN   . ENDARTERECTOMY POPLITEAL  03/20/2012   Procedure: ENDARTERECTOMY POPLITEAL;  Surgeon: Elam Dutch, MD;  Location: [facility] OR;  Service: Vascular;  Laterality: Left;   LIGATION OF LEFT LEG POPLITEAL ANEURYSM  . FLEXIBLE BRONCHOSCOPY N/A 11/30/2017   Procedure: FLEXIBLE BRONCHOSCOPY;  Surgeon: Sinda Du, MD;  [facility] END[facility];  Service: Cardiopulmonary;  Laterality: N/A;  . INTRAOPERATIVE ARTERIOGRAM  03/20/2012   Procedure: INTRA OPERATIVE ARTERIOGRAM;  Surgeon: Elam Dutch, MD;  Location: [place];  Service: Vascular;  Laterality: Left;  TIMES ONE  . PORTACATH PLACEMENT Left 01/06/2018   Procedure: INSERTION PORT-A-CATH;  Surgeon: Aviva Signs, MD;  Location: [facility] ORS;  Service: General;  Laterality: Left;  Marland Kitchen VASECTOMY  1982  . VIDEO BRONCHOSCOPY WITH ENDOBRONCHIAL ULTRASOUND N/A 12/23/2017   Procedure: VIDEO BRONCHOSCOPY WITH ENDOBRONCHIAL ULTRASOUND;  Surgeon: Melrose Nakayama, MD;  Location: [place];  Service: Thoracic;  Laterality: N/A;     SOCIAL HISTORY:  Social History   Socioeconomic History  . Marital status: Divorced    Spouse name: Not on file  . Number of children: 2  . Years of education: Not on file  . Highest education level: Not on file  Occupational History  . Occupation: [place]     Comment: Agent Orange exposure  . Occupation: Sales executive    Comment: [place] exsposure  Social Needs  . Financial resource strain: Not hard at  all  . Food insecurity:    Worry: Never true    Inability: Never true  . Transportation needs:    Medical: No    Non-medical: No  Tobacco Use  . Smoking status: Current Every Day Smoker    Years: 62.00    Types: Cigars  . Smokeless tobacco: Never Used  . Tobacco comment: pt states he smokes about 15 cigars per day  Substance and Sexual Activity  . Alcohol use: No  . Drug use: No  . Sexual activity: Yes    Birth control/protection: None  Lifestyle  . Physical activity:    Days per week: 0 days    Minutes per session: 0 min  . Stress: To some extent  Relationships  . Social connections:    Talks on phone: More than three times a week    Gets together: Once a week    Attends religious service: More than 4 times per year    Active member of club or organization: No    Attends meetings of clubs or organizations: Never    Relationship status: Divorced  . Intimate partner  violence:    Fear of current or ex partner: No    Emotionally abused: No    Physically abused: No    Forced sexual activity: No  Other Topics Concern  . Not on file  Social History Narrative   Sedentary    FAMILY HISTORY:  Family History  Problem Relation Age of Onset  . Diabetes Mother   . Stroke Mother   . Heart disease Father   . Diabetes Father   . Heart disease Sister        CABG x 4  . Cancer Brother        throat cancer  . Diabetes Sister   . Heart disease Sister   . Cancer Brother   . Kidney disease Son     CURRENT MEDICATIONS:  Outpatient Encounter Medications as of 04/17/2018  Medication Sig  . levothyroxine (SYNTHROID, LEVOTHROID) 137 MCG tablet Take 137 mcg by mouth daily before breakfast.   . lidocaine-prilocaine (EMLA) cream Apply small amount to port site and cover with plastic wrap one hour prior to appointment.  Marland Kitchen LORazepam (ATIVAN) 1 MG tablet Take 1 tablet (1 mg total) by mouth 2 (two) times daily.  . mirtazapine (REMERON) 7.5 MG tablet Take 1 tablet (7.5 mg total) by mouth  at bedtime.  Marland Kitchen omeprazole (PRILOSEC) 20 MG capsule Take 20 mg by mouth daily.  Marland Kitchen POTASSIUM PO Take 1 tablet by mouth daily.  . sertraline (ZOLOFT) 50 MG tablet Take 50 mg by mouth every morning.   . simvastatin (ZOCOR) 10 MG tablet Take 10 mg by mouth at bedtime.  . Ipratropium-Albuterol (COMBIVENT RESPIMAT) 20-100 MCG/ACT AERS respimat Inhale 1 puff into the lungs every 6 (six) hours as needed for wheezing.  Marland Kitchen levofloxacin (LEVAQUIN) 500 MG tablet Take 1 tablet (500 mg total) by mouth daily.  . magic mouthwash w/lidocaine SOLN Take 10 mLs by mouth 4 (four) times daily.  . metoCLOPramide (REGLAN) 5 MG tablet Take 5 mg by mouth daily before lunch.  . prochlorperazine (COMPAZINE) 10 MG tablet Take 1 tablet (10 mg total) by mouth every 6 (six) hours as needed (Nausea or vomiting). (Patient not taking: Reported on 04/06/2018)  . traMADol (ULTRAM) 50 MG tablet Take 1 tablet (50 mg total) by mouth every 6 (six) hours as needed. (Patient not taking: Reported on 04/17/2018)   No facility-administered encounter medications on file as of 04/17/2018.     ALLERGIES:  Allergies  Allergen Reactions  . Morphine And Related Shortness Of Breath and Other (See Comments)    Hallucinations pt has taken hydromorphone before     PHYSICAL EXAM:  ECOG Performance status: 1  Vitals:   04/17/18 0800  BP: (!) 106/59  Pulse: 98  Resp: 18  Temp: 99.2 F (37.3 C)  SpO2: 92%   Filed Weights   04/17/18 0800  Weight: 135 lb (61.2 kg)    Physical Exam Constitutional:      Appearance: Normal appearance. He is normal weight.  Cardiovascular:     Rate and Rhythm: Normal rate and regular rhythm.     Heart sounds: Normal heart sounds.  Pulmonary:     Effort: Pulmonary effort is normal.     Breath sounds: Normal breath sounds.  Musculoskeletal: Normal range of motion.  Skin:    General: Skin is warm and dry.  Neurological:     Mental Status: He is alert and oriented to person, place, and time. Mental  status is at baseline.  Psychiatric:  Mood and Affect: Mood normal.        Behavior: Behavior normal.        Thought Content: Thought content normal.        Judgment: Judgment normal.      LABORATORY DATA:  I have reviewed the labs as listed.  CBC    Component Value Date/Time   WBC 7.0 04/14/2018 1236   RBC 2.96 (L) 04/14/2018 1236   HGB 9.5 (L) 04/14/2018 1236   HCT 29.8 (L) 04/14/2018 1236   PLT 141 (L) 04/14/2018 1236   MCV 100.7 (H) 04/14/2018 1236   MCH 32.1 04/14/2018 1236   MCHC 31.9 04/14/2018 1236   RDW 18.5 (H) 04/14/2018 1236   LYMPHSABS 0.7 04/14/2018 1236   MONOABS 1.4 (H) 04/14/2018 1236   EOSABS 0.0 04/14/2018 1236   BASOSABS 0.0 04/14/2018 1236   CMP Latest Ref Rng & Units 04/14/2018 04/06/2018 03/24/2018  Glucose 70 - 99 mg/dL 117(H) 126(H) 139(H)  BUN 8 - 23 mg/dL _0 Creatinine 0.61 - 1.24 mg/dL 0.69 0.75 0.84  Sodium 135 - 145 mmol/L 136 137 136  Potassium 3.5 - 5.1 mmol/L 3.6 4.2 3.4(L)  Chloride 98 - 111 mmol/L 99 104 101  CO2 22 - 32 mmol/L _1 Calcium 8.9 - 10.3 mg/dL 8.1(L) 8.8(L) 8.5(L)  Total Protein 6.5 - 8.1 g/dL 6.9 7.3 7.1  Total Bilirubin 0.3 - 1.2 mg/dL 0.5 0.5 0.9  Alkaline Phos 38 - 126 U/L 65 66 63  AST 15 - 41 U/L _2 ALT 0 - 44 U/L _3 DIAGNOSTIC IMAGING:  I have independently reviewed the scans and discussed with the patient.   I have reviewed Francene Finders, NP's note and agree with the documentation.  I personally performed a face-to-face visit, made revisions and my assessment and plan is as follows.    ASSESSMENT & PLAN:   Small cell lung cancer ([place]) 1.  Limited stage small cell lung cancer: - CT abdomen pelvis on 11/08/2017 showed irregular narrowing of the right lower lobe bronchus associated with right hilar adenopathy.  - PET CT scan on 11/28/2017 shows lesion at the inferior lobar bronchus with SUV of 5.5 with right hilar hypermetabolic and ipsilateral mediastinal adenopathy  including right paratracheal, subcarinal and several small prevascular lymph nodes which demonstrate only low-grade metabolic activity.  - Bronchoscopy and EVIS biopsy on 12/23/2017 shows small cell carcinoma.  Lymph node aspirations were also consistent with small cell lung cancer.  - Patient is a current active smoker, smokes 1 pack/day for 60+ years.  He also had a brief exposure to asbestos.  He is a [place] [employer] veteran, was not sure whether he was exposed to agent orange. - MRI of the brain on 01/06/2018 was negative for metastatic disease. -Chemoradiation therapy with carboplatin and etoposide 4 cycles from 01/18/2018 through 03/27/2018.   - He had severe weakness after cycle 4 for which he required 2 units of blood transfusion. - He still feeling weak.  He has cough with whitish expectoration. - We reviewed the results of the PET CT scan from 04/13/2018.  Right paratracheal lymph node measures 1.1 cm with SUV of 4.7.  There is plugging of the bronchus intermedius with peribronchovascular thickening and density in the right hilar and infrahilar region with associated accentuated activity.  There is a new activity in the L1 vertebral body without appreciable CT lesion.  Adenopathy in the chest has improved. -  Overall I think he has gotten at least a partial response.  Some of the increased uptake in the right hilar region could be related to infection.  I will give a trial of Levaquin for 7 days.  For his L1 vertebral body lesion, I have recommended MRI of the lumbar spine with and without contrast. -He has an appointment with Dr.Yanagihara for MRI of the brain.  He will be a candidate for PCI. -Given the findings on the current PET CT scan, I have recommended repeating another scan in 2 months. -I will see him back in 2 to 3 weeks to discuss the results.   Time spent is 25 minutes with more than 50% of the time spent face-to-face discussing scan results, further plan of action and coordination of  care.     Orders placed this encounter:  Orders Placed This Encounter  Procedures  . MR Lumbar Spine W Wo Contrast  . NM PET Image Restag (PS) Skull Base To Thigh  . Magnesium  . CBC with Differential/Platelet  . Comprehensive metabolic panel  . Phosphorus      Derek Jack, MD [place] [phone]

## 2018-04-17 NOTE — Patient Instructions (Signed)
[facility] at [facility] Discharge Instructions     Thank you for choosing [facility] at [facility] to provide your oncology and hematology care.  To afford each patient quality time with our provider, please arrive at least 15 minutes before your scheduled appointment time.   If you have a lab appointment with the Cancer Center please come in thru the  Main Entrance and check in at the main information desk  You need to re-schedule your appointment should you arrive 10 or more minutes late.  We strive to give you quality time with our providers, and arriving late affects you and other patients whose appointments are after yours.  Also, if you no show three or more times for appointments you may be dismissed from the clinic at the providers discretion.     Again, thank you for choosing [facility].  Our hope is that these requests will decrease the amount of time that you wait before being seen by our physicians.       _____________________________________________________________  Should you have questions after your visit to  Cancer Center, please contact our office at [phone] between the hours of 8:00 a.m. and 4:30 p.m.  Voicemails left after 4:00 p.m. will not be returned until the following business day.  For prescription refill requests, have your pharmacy contact our office and allow 72 hours.    Cancer Center Support Programs:   > Cancer Support Group  2nd Tuesday of the month 1pm-2pm, Journey Room    

## 2018-04-18 ENCOUNTER — Ambulatory Visit (HOSPITAL_COMMUNITY): Payer: Medicare Other

## 2018-04-18 DIAGNOSIS — C3401 Malignant neoplasm of right main bronchus: Secondary | ICD-10-CM | POA: Diagnosis not present

## 2018-04-18 DIAGNOSIS — C3490 Malignant neoplasm of unspecified part of unspecified bronchus or lung: Secondary | ICD-10-CM | POA: Diagnosis not present

## 2018-04-19 ENCOUNTER — Ambulatory Visit (HOSPITAL_COMMUNITY): Payer: Medicare Other

## 2018-04-21 ENCOUNTER — Ambulatory Visit (HOSPITAL_COMMUNITY)
Admission: RE | Admit: 2018-04-21 | Discharge: 2018-04-21 | Disposition: A | Discharge status: Home / Self Care | Payer: Medicare Other | Source: Ambulatory Visit | Attending: Nurse Practitioner | Admitting: Nurse Practitioner

## 2018-04-21 DIAGNOSIS — C3490 Malignant neoplasm of unspecified part of unspecified bronchus or lung: Secondary | ICD-10-CM | POA: Diagnosis not present

## 2018-04-21 DIAGNOSIS — M5137 Other intervertebral disc degeneration, lumbosacral region: Secondary | ICD-10-CM | POA: Diagnosis not present

## 2018-04-21 MED ORDER — GADOBUTROL 1 MMOL/ML IV SOLN
6.0000 mL | Freq: Once | INTRAVENOUS | Status: AC | PRN
  Administered 2018-04-21: 6 mL via INTRAVENOUS

## 2018-04-25 DIAGNOSIS — C3401 Malignant neoplasm of right main bronchus: Secondary | ICD-10-CM | POA: Diagnosis not present

## 2018-05-04 ENCOUNTER — Encounter (HOSPITAL_COMMUNITY): Payer: Self-pay | Admitting: Hematology

## 2018-05-04 ENCOUNTER — Inpatient Hospital Stay (HOSPITAL_COMMUNITY): Payer: Medicare Other | Attending: Hematology | Admitting: Hematology

## 2018-05-04 ENCOUNTER — Other Ambulatory Visit: Payer: Self-pay

## 2018-05-04 ENCOUNTER — Inpatient Hospital Stay (HOSPITAL_COMMUNITY): Payer: Medicare Other

## 2018-05-04 VITALS — BP 112/66 | HR 73 | Temp 97.5°F | Wt 137.1 lb

## 2018-05-04 DIAGNOSIS — C3490 Malignant neoplasm of unspecified part of unspecified bronchus or lung: Secondary | ICD-10-CM

## 2018-05-04 DIAGNOSIS — R05 Cough: Secondary | ICD-10-CM | POA: Diagnosis not present

## 2018-05-04 DIAGNOSIS — C3431 Malignant neoplasm of lower lobe, right bronchus or lung: Secondary | ICD-10-CM | POA: Diagnosis not present

## 2018-05-04 DIAGNOSIS — Z720 Tobacco use: Secondary | ICD-10-CM | POA: Insufficient documentation

## 2018-05-04 LAB — COMPREHENSIVE METABOLIC PANEL
ALT: 18 U/L (ref 0–44)
AST: 19 U/L (ref 15–41)
Albumin: 3.3 g/dL — ABNORMAL LOW (ref 3.5–5.0)
Alkaline Phosphatase: 81 U/L (ref 38–126)
Anion gap: 6 (ref 5–15)
BUN: 16 mg/dL (ref 8–23)
CO2: 30 mmol/L (ref 22–32)
Calcium: 8.8 mg/dL — ABNORMAL LOW (ref 8.9–10.3)
Chloride: 103 mmol/L (ref 98–111)
Creatinine, Ser: 0.82 mg/dL (ref 0.61–1.24)
GFR calc Af Amer: >60 mL/min (ref >60)
GFR calc non Af Amer: >60 mL/min (ref >60)
Glucose, Bld: 109 mg/dL — ABNORMAL HIGH (ref 70–99)
POTASSIUM: 5.1 mmol/L (ref 3.5–5.1)
Sodium: 139 mmol/L (ref 135–145)
Total Bilirubin: 0.5 mg/dL (ref 0.3–1.2)
Total Protein: 7.3 g/dL (ref 6.5–8.1)

## 2018-05-04 LAB — CBC WITH DIFFERENTIAL/PLATELET
Abs Immature Granulocytes: 0.09 10*3/uL — ABNORMAL HIGH (ref 0.00–0.07)
BASOS ABS: 0.1 10*3/uL (ref 0.0–0.1)
Basophils Relative: 1 %
Eosinophils Absolute: 0.2 10*3/uL (ref 0.0–0.5)
Eosinophils Relative: 3 %
HCT: 35.5 % — ABNORMAL LOW (ref 39.0–52.0)
Hemoglobin: 10.7 g/dL — ABNORMAL LOW (ref 13.0–17.0)
IMMATURE GRANULOCYTES: 1 %
LYMPHS ABS: 1.1 10*3/uL (ref 0.7–4.0)
Lymphocytes Relative: 16 %
MCH: 31.5 pg (ref 26.0–34.0)
MCHC: 30.1 g/dL (ref 30.0–36.0)
MCV: 104.4 fL — AB (ref 80.0–100.0)
MONOS PCT: 13 %
Monocytes Absolute: 0.9 10*3/uL (ref 0.1–1.0)
NRBC: 0 % (ref 0.0–0.2)
Neutro Abs: 4.8 10*3/uL (ref 1.7–7.7)
Neutrophils Relative %: 66 %
Platelets: 203 10*3/uL (ref 150–400)
RBC: 3.4 MIL/uL — ABNORMAL LOW (ref 4.22–5.81)
RDW: 17.9 % — ABNORMAL HIGH (ref 11.5–15.5)
WBC: 7.2 10*3/uL (ref 4.0–10.5)

## 2018-05-04 LAB — PHOSPHORUS: Phosphorus: 4.2 mg/dL (ref 2.5–4.6)

## 2018-05-04 LAB — MAGNESIUM: Magnesium: 2.2 mg/dL (ref 1.7–2.4)

## 2018-05-04 NOTE — Patient Instructions (Signed)
[place] at [facility] Discharge Instructions  You were seen today by Dr. Delton Coombes, he went over your recent scan results and things looked good. We will do another PET scan in about 2 months and follow up with you after that.    Thank you for choosing [place] at [facility] to provide your oncology and hematology care.  To afford each patient quality time with our provider, please arrive at least 15 minutes before your scheduled appointment time.   If you have a lab appointment with the [place] please come in thru the  Main Entrance and check in at the main information desk  You need to re-schedule your appointment should you arrive 10 or more minutes late.  We strive to give you quality time with our providers, and arriving late affects you and other patients whose appointments are after yours.  Also, if you no show three or more times for appointments you may be dismissed from the clinic at the providers discretion.     Again, thank you for choosing [facility].  Our hope is that these requests will decrease the amount of time that you wait before being seen by our physicians.       _____________________________________________________________  Should you have questions after your visit to [facility], please contact our office at [phone] between the hours of 8:00 a.m. and 4:30 p.m.  Voicemails left after 4:00 p.m. will not be returned until the following business day.  For prescription refill requests, have your pharmacy contact our office and allow 72 hours.    Cancer Center Support Programs:   > Cancer Support Group  2nd Tuesday of the month 1pm-2pm, Journey Room

## 2018-05-04 NOTE — Assessment & Plan Note (Signed)
1.  Limited stage small cell lung cancer: - CT abdomen pelvis on 11/08/2017 showed irregular narrowing of the right lower lobe bronchus associated with right hilar adenopathy.  - PET CT scan on 11/28/2017 shows lesion at the inferior lobar bronchus with SUV of 5.5 with right hilar hypermetabolic and ipsilateral mediastinal adenopathy including right paratracheal, subcarinal and several small prevascular lymph nodes which demonstrate only low-grade metabolic activity.  - Bronchoscopy and EBUS biopsy on 12/23/2017 shows small cell carcinoma.  Lymph node aspirations were also consistent with small cell lung cancer.  - Patient is a current active smoker, smokes 1 pack/day for 60+ years.  He also had a brief exposure to asbestos.  He is a [place] [employer] veteran, was not sure whether he was exposed to agent orange. - MRI of the brain on 01/06/2018 was negative for metastatic disease. -Chemoradiation therapy with carboplatin and etoposide 4 cycles from 01/18/2018 through 03/27/2018.   - PET CT scan on 04/13/2018 shows right paratracheal lymph node measuring 1.1 cm with SUV of 4.7.  There is plugging of the bronchus intermedius with peribronchovascular thickening and density in the right hilar and infrahilar region with associated accentuated activity.  There was a new activity at L1 vertebral body.  Adenopathy in the chest has improved.  - Overall he got a very good partial response.  Some of the increased uptake in the right hilar region could be related to infection.  He used Levaquin and feels much better. -I reviewed results of the MRI of the lumbar spine dated 04/21/2018 which did not show any evidence of metastatic disease. -He was evaluated by Dr.Yanagihara.  An MRI of the brain was apparently normal.  He was offered PCI.  Patient declined it.  He chose to have surveillance MRI every 2 to 3 months. -Energy wise he is looking much better today.  His labs have also improved. -I will see him back on April 17 after  the PET scan.

## 2018-05-04 NOTE — Progress Notes (Signed)
[address], [place] [address]   CLINIC:  Medical Oncology/Hematology  PCP:  [place] [id number] [phone]   REASON FOR VISIT: Follow-up for limited stage small cell lung  CURRENT THERAPY: observation  BRIEF ONCOLOGIC HISTORY:    Small cell lung cancer ([place])   12/30/2017 Initial Diagnosis    Small cell lung cancer ([place])    01/18/2018 -  Chemotherapy    The patient had palonosetron (ALOXI) injection 0.25 mg, 0.25 mg, Intravenous,  Once, 4 of 4 cycles Administration: 0.25 mg (01/18/2018), 0.25 mg (02/08/2018), 0.25 mg (03/06/2018), 0.25 mg (03/27/2018) CARBOplatin (PARAPLATIN) 300 mg in sodium chloride 0.9 % 250 mL chemo infusion, 300 mg (100 % of original dose 300 mg), Intravenous,  Once, 4 of 4 cycles Dose modification: 300 mg (original dose 300 mg, Cycle 1), 409.5 mg (original dose 409.5 mg, Cycle 2),   (original dose 409.5 mg, Cycle 3), 409.5 mg (original dose 409.5 mg, Cycle 4) Administration: 300 mg (01/18/2018), 410 mg (02/08/2018), 410 mg (03/06/2018), 410 mg (03/27/2018) etoposide (VEPESID) 160 mg in sodium chloride 0.9 % 500 mL chemo infusion, 90 mg/m2 = 160 mg (90 % of original dose 100 mg/m2), Intravenous,  Once, 4 of 4 cycles Dose modification: 90 mg/m2 (90 % of original dose 100 mg/m2, Cycle 1, Reason: Patient Age) Administration: 160 mg (01/18/2018), 170 mg (01/19/2018), 170 mg (02/10/2018), 170 mg (02/08/2018), 170 mg (02/09/2018), 170 mg (01/20/2018), 170 mg (03/07/2018), 170 mg (03/06/2018), 170 mg (03/08/2018), 170 mg (03/27/2018), 170 mg (03/28/2018), 170 mg (03/29/2018) fosaprepitant (EMEND) 150 mg, dexamethasone (DECADRON) 12 mg in sodium chloride 0.9 % 145 mL IVPB, , Intravenous,  Once, 4 of 4 cycles Administration:  (01/18/2018),  (02/08/2018),  (03/06/2018),  (03/27/2018)  for chemotherapy treatment.       INTERVAL HISTORY:  Mr. Sawin 78 y.o. male returns for routine follow-up for limited  stage small cell lung. He is here with his daughter. He has been doing better since finishing treatment and his last visit. He has more energy and is getting back to his old self again. He did have a cold and still has the cough from it but it is improving. Denies any nausea, vomiting, or diarrhea. Denies any new pains. Had not noticed any recent bleeding such as epistaxis, hematuria or hematochezia. Denies recent chest pain on exertion, shortness of breath on minimal exertion, pre-syncopal episodes, or palpitations. Denies any numbness or tingling in hands or feet. Denies any recent fevers, infections, or recent hospitalizations. Patient reports appetite at 75% and energy level at 50%. He is eating well and maintaining his weight at this time.    REVIEW OF SYSTEMS:  Review of Systems  Respiratory: Positive for cough.   All other systems reviewed and are negative.    PAST MEDICAL/SURGICAL HISTORY:  Past Medical History:  Diagnosis Date  . AAA (abdominal aortic aneurysm) ([place])   . Alternating constipation and diarrhea   . Arthritis   . Chronic back pain   . COPD (chronic obstructive pulmonary disease) ([place])   . Depression   . GERD (gastroesophageal reflux disease)   . History of hiatal hernia   . Hypertension    not on medications  . Hypothyroidism   . Lung cancer ([place])   . Peripheral vascular disease ([place])   . Smoker   . Thyroid disease    Past Surgical History:  Procedure Laterality Date  . ABDOMINAL AORTAGRAM N/A 03/14/2012   Procedure:  ABDOMINAL AORTAGRAM;  Surgeon: Serafina Mitchell, MD;  Location: [facility] CATH LAB;  Service: Cardiovascular;  Laterality: N/A;  . ABDOMINAL AORTIC ANEURYSM REPAIR  04-22-2009   Stent graft repair of AAA  . APPENDECTOMY    . BRONCHIAL BRUSHINGS Right 11/30/2017   Procedure: BRONCHIAL BRUSHINGS;  Surgeon: Sinda Du, MD;  Location: [facility];  Service: Cardiopulmonary;  Laterality: Right;  . BRONCHIAL WASHINGS Right 11/30/2017   Procedure:  BRONCHIAL WASHINGS;  Surgeon: Sinda Du, MD;  Location: [facility];  Service: Cardiopulmonary;  Laterality: Right;  . BYPASS GRAFT POPLITEAL TO POPLITEAL  03/20/2012   Procedure: BYPASS GRAFT POPLITEAL TO POPLITEAL;  Surgeon: Elam Dutch, MD;  Location: [facility];  Service: Vascular;  Laterality: Left;  ABOVE THE KNEE POPLITEAL ARTERY TO BELOW THE KNEE POPLITEAL ARTERY BYPASS GRAFT USING REVERSE LEFT GREATER SAPHENOUS VEIN   . ENDARTERECTOMY POPLITEAL  03/20/2012   Procedure: ENDARTERECTOMY POPLITEAL;  Surgeon: Elam Dutch, MD;  Location: [facility] OR;  Service: Vascular;  Laterality: Left;   LIGATION OF LEFT LEG POPLITEAL ANEURYSM  . FLEXIBLE BRONCHOSCOPY N/A 11/30/2017   Procedure: FLEXIBLE BRONCHOSCOPY;  Surgeon: Sinda Du, MD;  [facility]: [facility];  Service: Cardiopulmonary;  Laterality: N/A;  . INTRAOPERATIVE ARTERIOGRAM  03/20/2012   Procedure: INTRA OPERATIVE ARTERIOGRAM;  Surgeon: Elam Dutch, MD;  Location: [facility];  Service: Vascular;  Laterality: Left;  TIMES ONE  . PORTACATH PLACEMENT Left 01/06/2018   Procedure: INSERTION PORT-A-CATH;  Surgeon: Aviva Signs, MD;  Location: [facility] ORS;  Service: General;  Laterality: Left;  Marland Kitchen VASECTOMY  1982  . VIDEO BRONCHOSCOPY WITH ENDOBRONCHIAL ULTRASOUND N/A 12/23/2017   Procedure: VIDEO BRONCHOSCOPY WITH ENDOBRONCHIAL ULTRASOUND;  Surgeon: Melrose Nakayama, MD;  Location: [facility];  Service: Thoracic;  Laterality: N/A;     SOCIAL HISTORY:  Social History   Socioeconomic History  . Marital status: Divorced    Spouse name: Not on file  . Number of children: 2  . Years of education: Not on file  . Highest education level: Not on file  Occupational History  . Occupation: [place]     Comment: Agent Orange exposure  . Occupation: Sales executive    Comment: [place] exsposure  Social Needs  . Financial resource strain: Not hard at all  . Food insecurity:    Worry: Never true    Inability: Never true  . Transportation  needs:    Medical: No    Non-medical: No  Tobacco Use  . Smoking status: Current Every Day Smoker    Years: 62.00    Types: Cigars  . Smokeless tobacco: Never Used  . Tobacco comment: pt states he smokes about 15 cigars per day  Substance and Sexual Activity  . Alcohol use: No  . Drug use: No  . Sexual activity: Yes    Birth control/protection: None  Lifestyle  . Physical activity:    Days per week: 0 days    Minutes per session: 0 min  . Stress: To some extent  Relationships  . Social connections:    Talks on phone: More than three times a week    Gets together: Once a week    Attends religious service: More than 4 times per year    Active member of club or organization: No    Attends meetings of clubs or organizations: Never    Relationship status: Divorced  . Intimate partner violence:    Fear of current or ex partner: No    Emotionally abused:  No    Physically abused: No    Forced sexual activity: No  Other Topics Concern  . Not on file  Social History Narrative   Sedentary    FAMILY HISTORY:  Family History  Problem Relation Age of Onset  . Diabetes Mother   . Stroke Mother   . Heart disease Father   . Diabetes Father   . Heart disease Sister        CABG x 4  . Cancer Brother        throat cancer  . Diabetes Sister   . Heart disease Sister   . Cancer Brother   . Kidney disease Son     CURRENT MEDICATIONS:  Outpatient Encounter Medications as of 05/04/2018  Medication Sig  . Ipratropium-Albuterol (COMBIVENT RESPIMAT) 20-100 MCG/ACT AERS respimat Inhale 1 puff into the lungs every 6 (six) hours as needed for wheezing.  Mar[place] Kitchen levofloxacin (LEVAQUIN) 500 MG tablet Take 1 tablet (500 mg total) by mouth daily.  Marland Kitchen levothyroxine (SYNTHROID, LEVOTHROID) 137 MCG tablet Take 137 mcg by mouth daily before breakfast.   . lidocaine-prilocaine (EMLA) cream Apply small amount to port site and cover with plastic wrap one hour prior to appointment.  Marland Kitchen LORazepam (ATIVAN) 1  MG tablet Take 1 tablet (1 mg total) by mouth 2 (two) times daily.  . magic mouthwash w/lidocaine SOLN Take 10 mLs by mouth 4 (four) times daily.  . metoCLOPramide (REGLAN) 5 MG tablet Take 5 mg by mouth daily before lunch.  . mirtazapine (REMERON) 7.5 MG tablet Take 1 tablet (7.5 mg total) by mouth at bedtime.  Marland Kitchen omeprazole (PRILOSEC) 20 MG capsule Take 20 mg by mouth daily.  Marland Kitchen POTASSIUM PO Take 1 tablet by mouth daily.  . prochlorperazine (COMPAZINE) 10 MG tablet Take 1 tablet (10 mg total) by mouth every 6 (six) hours as needed (Nausea or vomiting).  . sertraline (ZOLOFT) 50 MG tablet Take 50 mg by mouth every morning.   . simvastatin (ZOCOR) 10 MG tablet Take 10 mg by mouth at bedtime.  . traMADol (ULTRAM) 50 MG tablet Take 1 tablet (50 mg total) by mouth every 6 (six) hours as needed.   No facility-administered encounter medications on file as of 05/04/2018.     ALLERGIES:  Allergies  Allergen Reactions  . Morphine And Related Shortness Of Breath and Other (See Comments)    Hallucinations pt has taken hydromorphone before     PHYSICAL EXAM:  ECOG Performance status: 1  Vitals:   05/04/18 0949  BP: 112/66  Pulse: 73  Temp: (!) 97.5 F (36.4 C)  SpO2: 100%   Filed Weights   05/04/18 0949  Weight: 137 lb 1.6 oz (62.2 kg)    Physical Exam Constitutional:      Appearance: Normal appearance. He is normal weight.  Cardiovascular:     Rate and Rhythm: Normal rate and regular rhythm.     Heart sounds: Normal heart sounds.  Pulmonary:     Effort: Pulmonary effort is normal.     Breath sounds: Normal breath sounds.  Musculoskeletal: Normal range of motion.  Skin:    General: Skin is warm and dry.  Neurological:     Mental Status: He is alert and oriented to person, place, and time. Mental status is at baseline.  Psychiatric:        Mood and Affect: Mood normal.        Behavior: Behavior normal.        Thought Content: Thought content normal.  Judgment: Judgment  normal.      LABORATORY DATA:  I have reviewed the labs as listed.  CBC    Component Value Date/Time   WBC 7.2 05/04/2018 0923   RBC 3.40 (L) 05/04/2018 0923   HGB 10.7 (L) 05/04/2018 0923   HCT 35.5 (L) 05/04/2018 0923   PLT 203 05/04/2018 0923   MCV 104.4 (H) 05/04/2018 0923   MCH 31.5 05/04/2018 0923   MCHC 30.1 05/04/2018 0923   RDW 17.9 (H) 05/04/2018 0923   LYMPHSABS 1.1 05/04/2018 0923   MONOABS 0.9 05/04/2018 0923   EOSABS 0.2 05/04/2018 0923   BASOSABS 0.1 05/04/2018 0923   CMP Latest Ref Rng & Units 05/04/2018 04/14/2018 04/06/2018  Glucose 70 - 99 mg/dL 109(H) 117(H) 126(H)  BUN 8 - 23 mg/dL _0 Creatinine 0.61 - 1.24 mg/dL 0.82 0.69 0.75  Sodium 135 - 145 mmol/L 139 136 137  Potassium 3.5 - 5.1 mmol/L 5.1 3.6 4.2  Chloride 98 - 111 mmol/L 103 99 104  CO2 22 - 32 mmol/L _1 Calcium 8.9 - 10.3 mg/dL 8.8(L) 8.1(L) 8.8(L)  Total Protein 6.5 - 8.1 g/dL 7.3 6.9 7.3  Total Bilirubin 0.3 - 1.2 mg/dL 0.5 0.5 0.5  Alkaline Phos 38 - 126 U/L 81 65 66  AST 15 - 41 U/L _2 ALT 0 - 44 U/L _3 DIAGNOSTIC IMAGING:  I have independently reviewed the scans and discussed with the patient.   I have reviewed Francene Finders, NP's note and agree with the documentation.  I personally performed a face-to-face visit, made revisions and my assessment and plan is as follows.    ASSESSMENT & PLAN:   Small cell lung cancer ([place]) 1.  Limited stage small cell lung cancer: - CT abdomen pelvis on 11/08/2017 showed irregular narrowing of the right lower lobe bronchus associated with right hilar adenopathy.  - PET CT scan on 11/28/2017 shows lesion at the inferior lobar bronchus with SUV of 5.5 with right hilar hypermetabolic and ipsilateral mediastinal adenopathy including right paratracheal, subcarinal and several small prevascular lymph nodes which demonstrate only low-grade metabolic activity.  - Bronchoscopy and EBUS biopsy on 12/23/2017 shows small cell  carcinoma.  Lymph node aspirations were also consistent with small cell lung cancer.  - Patient is a current active smoker, smokes 1 pack/day for 60+ years.  He also had a brief exposure to asbestos.  He is a [place] [employer] veteran, was not sure whether he was exposed to agent orange. - MRI of the brain on 01/06/2018 was negative for metastatic disease. -Chemoradiation therapy with carboplatin and etoposide 4 cycles from 01/18/2018 through 03/27/2018.   - PET CT scan on 04/13/2018 shows right paratracheal lymph node measuring 1.1 cm with SUV of 4.7.  There is plugging of the bronchus intermedius with peribronchovascular thickening and density in the right hilar and infrahilar region with associated accentuated activity.  There was a new activity at L1 vertebral body.  Adenopathy in the chest has improved.  - Overall he got a very good partial response.  Some of the increased uptake in the right hilar region could be related to infection.  He used Levaquin and feels much better. -I reviewed results of the MRI of the lumbar spine dated 04/21/2018 which did not show any evidence of metastatic disease. -He was evaluated by Dr.Yanagihara.  An MRI of the brain was apparently normal.  He was offered PCI.  Patient declined it.  He chose to have surveillance MRI every 2 to 3 months. -Energy wise he is looking much better today.  His labs have also improved. -I will see him back on April 17 after the PET scan.         Orders placed this encounter:  No orders of the defined types were placed in this encounter.     Derek Jack, MD [place] [phone]

## 2018-05-12 ENCOUNTER — Other Ambulatory Visit (HOSPITAL_COMMUNITY): Payer: Self-pay | Admitting: *Deleted

## 2018-05-12 DIAGNOSIS — C3490 Malignant neoplasm of unspecified part of unspecified bronchus or lung: Secondary | ICD-10-CM

## 2018-05-12 MED ORDER — LORAZEPAM 1 MG PO TABS: 1.0000 mg | ORAL_TABLET | Freq: Two times a day (BID) | ORAL | 0 refills | Status: DC

## 2018-05-29 ENCOUNTER — Ambulatory Visit (INDEPENDENT_AMBULATORY_CARE_PROVIDER_SITE_OTHER): Payer: No Typology Code available for payment source | Admitting: Gastroenterology

## 2018-05-29 ENCOUNTER — Encounter: Payer: Self-pay | Admitting: Gastroenterology

## 2018-05-29 ENCOUNTER — Other Ambulatory Visit: Payer: Self-pay

## 2018-05-29 DIAGNOSIS — K3184 Gastroparesis: Secondary | ICD-10-CM | POA: Diagnosis not present

## 2018-05-29 DIAGNOSIS — R634 Abnormal weight loss: Secondary | ICD-10-CM | POA: Insufficient documentation

## 2018-05-29 NOTE — Progress Notes (Signed)
Primary Care Physician:  McCrory  Primary Gastroenterologist:  Garfield Cornea, MD   Chief Complaint  Patient presents with  . Morning Sickness    last episode 1 1/2 yrs ago  . Weight Loss    21yrtime frame lost 45lbs    HPI:  Terry MCCLENATHANis a 78y.o. male here [facility] [place]  Reason for visit, refractory nausea vomiting, weight loss.  Patient and patient's daughter both unclear on the reasoning for this consult today stating it "came out of the blue."  Request mentioned electrogastrogram, which we do not offer here.    Patient was having diffuse abdominal pain, worse after meals, complaining of everything he eats gives some gas.  Complained of constipation.  This was when he last saw PCP.  CT abdomen pelvis July 2019 concerning for possible blood in the stomach, raising concern for an ulcer.  4.7 cm periampullary duodenal diverticulum.  Severe sigmoid diverticulosis.  Widely patent celiac trunk.  Atherosclerotic disease causes mild proximal SMA stenosis.  EGD with Dr. MBurke Keelsarranged.  [facility].  Found to have medium-sized hiatal hernia, residual food present.  Suspect gastroparesis.  Gastric atony.  Gastric emptying study October 28, 2017 showed gastric retention at 120 minutes is 100%.  Barium esophagram December 05, 2017 showed laryngeal penetration but no aspiration.  Atonic stomach suggesting gastroparesis.  Barium tablet passed through the esophagus without delay.  [facility]  History provided by patient who was seen in the office, due to COVID-19 restrictions his daughter was not allowed physically during the visit.  I did speak with her on phone while patient was present in the office.  Patient started having problems back in February 2019.  Over 398-montheriod of time he had  visible significant weight loss.  Could eat very little.  Nauseated all the time.  Small amounts with feel him up.  Tried to survive on Ensure but it would make his bowels crazy.  According to the daughter and patient, he was tried on several medications for nausea.  He was tried on Reglan as well.  Really no significant benefit and was advised to stop the Reglan at some point by his PCP.    Patient noted significant improvement of the symptoms when [place] [facility] [place] [facility].  During that study he had a large amount of solid food upon entry to the stomach.  Pylorus appeared patent.  Balloon dilatation performed.  Since then he has been able to eat multiple small meals daily.  Continues to supplement with boost or Ensure.  He was diagnosed with small cell lung carcinoma back in November 2019.  He has completed chemoradiation therapy back in January.  PET scan in February he was felt to have a good partial response and plans for surveillance MRI brain every 2 to 3 months, last one was no evidence of metastatic disease.  PET scan again in April.  His daughter Terry Motteports that she does not feel strongly that patient needs to have a colonoscopy which is been suggested [place] [facility] [place] [facility] [place] [facility] tells me that he does not want to have it done either.  His weight loss is likely explained by chronic nausea  and vomiting, malignancy.  He had a documented 40 pound weight loss from July 2018 to November 2019.  Fortunately has been able to gain 5 pounds since February.  He reports no nausea or vomiting now.  He typically has nutritional supplemental drinks for breakfast as well as a cup of coffee.  Goes out for lunch and consumes half of the portion initially, completing the entire meal about 3 to 4 hours later.  Generally has a snack and another nutritional drink later in the day.  No heartburn.  Bowel movements regular.  No melena or rectal  bleeding.  He has never had a colonoscopy, and does not want to have one.   Of note patient had normal CBC in November.  With chemoradiation his hemoglobin declined to 7.5 over time with lowest point at April 06, 2018.  His hemoglobin is back up to 10.7 on May 04, 2018.   Current Outpatient Medications  Medication Sig Dispense Refill  . Ipratropium-Albuterol (COMBIVENT RESPIMAT) 20-100 MCG/ACT AERS respimat Inhale 1 puff into the lungs every 6 (six) hours as needed for wheezing.    Marland Kitchen levothyroxine (SYNTHROID, LEVOTHROID) 137 MCG tablet Take 137 mcg by mouth daily before breakfast.     . LORazepam (ATIVAN) 1 MG tablet Take 1 tablet (1 mg total) by mouth 2 (two) times daily. 60 tablet 0  . omeprazole (PRILOSEC) 20 MG capsule Take 20 mg by mouth daily.    . prochlorperazine (COMPAZINE) 10 MG tablet Take 1 tablet (10 mg total) by mouth every 6 (six) hours as needed (Nausea or vomiting). 30 tablet 1  . sertraline (ZOLOFT) 50 MG tablet Take 50 mg by mouth every morning.     . simvastatin (ZOCOR) 10 MG tablet Take 10 mg by mouth at bedtime.    . traMADol (ULTRAM) 50 MG tablet Take 1 tablet (50 mg total) by mouth every 6 (six) hours as needed. 20 tablet 0   No current facility-administered medications for this visit.     Allergies as of 05/29/2018 - Review Complete 05/29/2018  Allergen Reaction Noted  . Morphine and related Shortness Of Breath and Other (See Comments) 01/13/2012    Past Medical History:  Diagnosis Date  . AAA (abdominal aortic aneurysm) ([place])   . Alternating constipation and diarrhea   . Arthritis   . Chronic back pain   . COPD (chronic obstructive pulmonary disease) ([place])   . Depression   . GERD (gastroesophageal reflux disease)   . History of hiatal hernia   . Hypertension    not on medications  . Hypothyroidism   . Lung cancer ([place]) 2019  . Peripheral vascular disease ([place])   . Smoker   . Thyroid disease     Past Surgical History:  Procedure Laterality  Date  . ABDOMINAL AORTAGRAM N/A 03/14/2012   Procedure: ABDOMINAL Maxcine Ham;  Surgeon: Serafina Mitchell, MD;  Location: [facility] CATH LAB;  Service: Cardiovascular;  Laterality: N/A;  . ABDOMINAL AORTIC ANEURYSM REPAIR  04-22-2009   Stent graft repair of AAA  . APPENDECTOMY    . BRONCHIAL BRUSHINGS Right 11/30/2017   Procedure: BRONCHIAL BRUSHINGS;  Surgeon: Sinda Du, MD;  Location: [facility] ENDO SUITE;  Service: Cardiopulmonary;  Laterality: Right;  . BRONCHIAL WASHINGS Right 11/30/2017   Procedure: BRONCHIAL WASHINGS;  Surgeon: Sinda Du, MD;  Location: [facility] ENDO SUITE;  Service: Cardiopulmonary;  Laterality: Right;  . BYPASS GRAFT POPLITEAL TO POPLITEAL  03/20/2012   Procedure: BYPASS GRAFT POPLITEAL TO POPLITEAL;  Surgeon: Elam Dutch, MD;  Location: [facility] OR;  Service: Vascular;  Laterality: Left;  ABOVE THE KNEE POPLITEAL ARTERY TO BELOW THE KNEE POPLITEAL ARTERY BYPASS GRAFT USING REVERSE LEFT GREATER SAPHENOUS VEIN   . ENDARTERECTOMY POPLITEAL  03/20/2012   Procedure: ENDARTERECTOMY POPLITEAL;  Surgeon: Elam Dutch, MD;  Location[facility];  Service: Vascular;  Laterality: Left;   LIGATION OF LEFT LEG POPLITEAL ANEURYSM  . ESOPHAGOGASTRODUODENOSCOPY  09/28/2017   Dr. Lurlean Nanny: Medium sized hiatal hernia, residual food.  Suspect gastroparesis.  Gastric atony.  . ESOPHAGOGASTRODUODENOSCOPY  01/05/2018   [place].  Large amount of solid food upon entry to the stomach.  Pylorus appeared patent.  Normal duodenal mucosa without evidence of obstruction to the second portion of duodenum.  Balloon dilation performed of the pylorus.  Marland Kitchen FLEXIBLE BRONCHOSCOPY N/A 11/30/2017   Procedure: FLEXIBLE BRONCHOSCOPY;  Surgeon: Sinda Du, MD;  [facility] ENDO SU[facility];  Service: Cardiopulmonary;  Laterality: N/A;  . INTRAOPERATIVE ARTERIOGRAM  03/20/2012   Procedure: INTRA OPERATIVE ARTERIOGRAM;  Surgeon: Elam Dutch, MD;  Location: [place];  Service: Vascular;  Laterality: Left;  TIMES ONE  .  PORTACATH PLACEMENT Left 01/06/2018   Procedure: INSERTION PORT-A-CATH;  Surgeon: Aviva Signs, MD;  Location: [facility] ORS;  Service: General;  Laterality: Left;  Marland Kitchen VASECTOMY  1982  . VIDEO BRONCHOSCOPY WITH ENDOBRONCHIAL ULTRASOUND N/A 12/23/2017   Procedure: VIDEO BRONCHOSCOPY WITH ENDOBRONCHIAL ULTRASOUND;  Surgeon: Melrose Nakayama, MD;  Location: [facility] OR;  Service: Thoracic;  Laterality: N/A;    Family History  Problem Relation Age of Onset  . Diabetes Mother   . Stroke Mother   . Heart disease Father   . Diabetes Father   . Heart disease Sister        CABG x 4  . Cancer Brother        throat cancer  . Diabetes Sister   . Heart disease Sister   . Cancer Brother        pancreatic  . Prostate cancer Brother   . Kidney disease Son   . Colon cancer Neg Hx     Social History   Socioeconomic History  . Marital status: Divorced    Spouse name: Not on file  . Number of children: 2  . Years of education: Not on file  . Highest education level: Not on file  Occupational History  . Occupation: [place]     Comment: Agent [place] exposure  . Occupation: Sales executive    Comment: [place] exsposure  Social Needs  . Financial resource strain: Not hard at all  . Food insecurity:    Worry: Never true    Inability: Never true  . Transportation needs:    Medical: No    Non-medical: No  Tobacco Use  . Smoking status: Current Every Day Smoker    Years: 62.00    Types: Cigars  . Smokeless tobacco: Never Used  . Tobacco comment: pt states he smokes about 15 cigars per day  Substance and Sexual Activity  . Alcohol use: No  . Drug use: No  . Sexual activity: Yes    Birth control/protection: None  Lifestyle  . Physical activity:    Days per week: 0 days    Minutes per session: 0 min  . Stress: To some extent  Relationships  . Social connections:    Talks on phone: More than three times a week    Gets together: Once a week    Attends religious service: More than 4 times  per year    Active member of club or organization: No    Attends meetings of clubs or organizations: Never    Relationship status: Divorced  . Intimate partner violence:    Fear of current or ex partner: No    Emotionally abused: No    Physically abused: No    Forced sexual activity: No  Other Topics Concern  . Not on file  Social History Narrative   Sedentary      ROS:  General: Negative for anorexia, weight loss, fever, chills, fatigue, weakness. Eyes: Negative for vision changes.  ENT: Negative for hoarseness, difficulty swallowing , nasal congestion. CV: Negative for chest pain, angina, palpitations, +dyspnea on exertion, peripheral edema.  Respiratory: Negative for dyspnea at rest, +dyspnea on exertion, +cough, sputum, wheezing.  GI: See history of present illness. GU:  Negative for dysuria, hematuria, urinary incontinence, urinary frequency, nocturnal urination.  MS: Negative for joint pain, low back pain.  Derm: Negative for rash or itching.  Neuro: Negative for weakness, abnormal sensation, seizure, frequent headaches, memory loss, confusion.  Psych: Negative for anxiety, depression, suicidal ideation, hallucinations.  Endo: Negative for unusual weight change.  Heme: Negative for bruising or bleeding. Allergy: Negative for rash or hives.    Physical Examination:  BP 104/69   Pulse 81   Temp (!) 81 F (27.2 C) (Oral)   Ht _0  (1.778 m)   Wt 140 lb 6.4 oz (63.7 kg)   BMI 20.15 kg/m    General: Well-nourished, well-developed in no acute distress.  Head: Normocephalic, atraumatic.   Eyes: Conjunctiva pink, no icterus. Mouth: Oropharyngeal mucosa moist and pink , no lesions erythema or exudate. Neck: Supple without thyromegaly, masses, or lymphadenopathy.  Lungs: Clear to auscultation bilaterally.  Heart: Regular rate and rhythm, no murmurs rubs or gallops.  Abdomen: Bowel sounds are normal, nontender, nondistended, no hepatosplenomegaly or masses, no  abdominal bruits or    hernia , no rebound or guarding.   Rectal: not performed Extremities: No lower extremity edema. No clubbing or deformities.  Neuro: Alert and oriented x 4 , grossly normal neurologically.  Skin: Warm and dry, no rash or jaundice.   Psych: Alert and cooperative, normal mood and affect.  Labs: Lab Results  Component Value Date   CREATININE 0.82 05/04/2018   BUN 16 05/04/2018   NA 139 05/04/2018   K 5.1 05/04/2018   CL 103 05/04/2018   CO2 30 05/04/2018   Lab Results  Component Value Date   ALT 18 05/04/2018   AST 19 05/04/2018   ALKPHOS 81 05/04/2018   BILITOT 0.5 05/04/2018   Lab Results  Component Value Date   WBC 7.2 05/04/2018   HGB 10.7 (L) 05/04/2018   HCT 35.5 (L) 05/04/2018   MCV 104.4 (H) 05/04/2018   PLT 203 05/04/2018   No results found for: TSH No results found for: IRON, TIBC, FERRITIN No results found for: VITAMINB12 No results found for: FOLATE   Imaging Studies: No results found.

## 2018-05-29 NOTE — Patient Instructions (Addendum)
I will be reviewing all of your records with Dr. Gala Romney.  I do not anticipate any recommendations at this time for colonoscopy.  However if you have change in bowel habits, blood in the stool, anemia then at that point you may require colonoscopy.  I discussed everything with your daughter as well.  I will follow-up on PET scan that you have later this month and touch base with Dr. Delton Coombes to determine if he has any concerns or desires for colonoscopy.  We will also pass along all of this information to the [place].  Please monitor your weight.  Let us know if you have any additional weight loss of greater than 5 pounds.  With gastroparesis, it is important to eat small portions of food every 3-4 hours as tolerated to maintain your weight.  Fatty/fried foods are the hardest to digest, limit as much as possible.  We will be in touch with any further recommendations as necessary, however, please call if you have any questions or concerns.   Gastroparesis  Gastroparesis is a condition in which food takes longer than normal to empty from the stomach. The condition is usually long-lasting (chronic). It may also be called delayed gastric emptying. There is no cure, but there are treatments and things that you can do at home to help relieve symptoms. Treating the underlying condition that causes gastroparesis can also help relieve symptoms. What are the causes? In many cases, the cause of this condition is not known. Possible causes include:  A hormone (endocrine) disorder, such as hypothyroidism or diabetes.  A nervous system disease, such as Parkinson's disease or multiple sclerosis.  Cancer, infection, or surgery that affects the stomach or vagus nerve. The vagus nerve runs from your chest, through your neck, to the lower part of your brain.  A connective tissue disorder, such as scleroderma.  Certain medicines. What increases the risk? You are more likely to develop this condition if  you:  Have certain disorders or diseases, including: ? An endocrine disorder. ? An eating disorder. ? Amyloidosis. ? Scleroderma. ? Parkinson's disease. ? Multiple sclerosis. ? Cancer or infection of the stomach or the vagus nerve.  Have had surgery on the stomach or vagus nerve.  Take certain medicines.  Are male. What are the signs or symptoms? Symptoms of this condition include:  Feeling full after eating very little.  Nausea.  Vomiting.  Heartburn.  Abdominal bloating.  Inconsistent blood sugar (glucose) levels on blood tests.  Lack of appetite.  Weight loss.  Acid from the stomach coming up into the esophagus (gastroesophageal reflux).  Sudden tightening (spasm) of the stomach, which can be painful. Symptoms may come and go. Some people may not notice any symptoms. How is this diagnosed? This condition is diagnosed with tests, such as:  Tests that check how long it takes food to move through the stomach and intestines. These tests include: ? Upper gastrointestinal (GI) series. For this test, you drink a liquid that shows up well on X-rays, and then X-rays will be taken of your intestines. ? Gastric emptying scintigraphy. For this test, you eat food that contains a small amount of radioactive material, and then scans are taken. ? Wireless capsule GI monitoring system. For this test, you swallow a pill (capsule) that records information about how foods and fluid move through your stomach.  Gastric manometry. For this test, a tube is passed down your throat and into your stomach to measure electrical and muscular activity.  Endoscopy. For this  test, a long, thin tube is passed down your throat and into your stomach to check for problems in your stomach lining.  Ultrasound. This test uses sound waves to create images of inside the body. This can help rule out gallbladder disease or pancreatitis as a cause of your symptoms. How is this treated? There is no cure  for gastroparesis. Treatment may include:  Treating the underlying cause.  Managing your symptoms by making changes to your diet and exercise habits.  Taking medicines to control nausea and vomiting and to stimulate stomach muscles.  Getting food through a feeding tube in the hospital. This may be done in severe cases.  Having surgery to insert a device into your body that helps improve stomach emptying and control nausea and vomiting (gastric neurostimulator). Follow these instructions at home:  Take over-the-counter and prescription medicines only as told by your health care provider.  Follow instructions from your health care provider about eating or drinking restrictions. Your health care provider may recommend that you: ? Eat smaller meals more often. ? Eat low-fat foods. ? Eat low-fiber forms of high-fiber foods. For example, eat cooked vegetables instead of raw vegetables. ? Have only liquid foods instead of solid foods. Liquid foods are easier to digest.  Drink enough fluid to keep your urine pale yellow.  Exercise as often as told by your health care provider.  Keep all follow-up visits as told by your health care provider. This is important. Contact a health care provider if you:  Notice that your symptoms do not improve with treatment.  Have new symptoms. Get help right away if you:  Have severe abdominal pain that does not improve with treatment.  Have nausea that is severe or does not go away.  Cannot drink fluids without vomiting. Summary  Gastroparesis is a chronic condition in which food takes longer than normal to empty from the stomach.  Symptoms include nausea, vomiting, heartburn, abdominal bloating, and loss of appetite.  Eating smaller portions, and low-fat, low-fiber foods may help you manage your symptoms.  Get help right away if you have severe abdominal pain. This information is not intended to replace advice given to you by your health care  provider. Make sure you discuss any questions you have with your health care provider. Document Released: 02/15/2005 Document Revised: 12/21/2016 Document Reviewed: 12/21/2016 Elsevier Interactive Patient Education  2019 [employer].

## 2018-05-30 NOTE — Assessment & Plan Note (Signed)
78 year old gentleman with documented delayed gastric emptying with clinical improvement status post pyloric dilation but no obvious pyloric stenosis on EGD in November 2019.  Subsequent diagnosis of small cell lung carcinoma.  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  He has gained 5 pounds in the past month.  Outside of anemia in the setting of chemoradiation which is also improving, he has no other concerns.  I have discussed at length with both patient and his daughter regarding any concerns.  Referral did not specifically request colonoscopy but patient tells me that it has been suggested several times through the [place] system but he has declined.  He is not interested in colonoscopy at this time and as long as he is maintaining his weight, his anemia rebounds, would not necessarily recommend one.  Daughter in agreement with this plan.  Discussed with Dr. Gala Romney as well.  We will follow-up on PET scan scheduled in a couple of weeks.  Once those results are available, I will make contact with his oncologist Dr. Delton Coombes to determine if he has any concerns or desires for colonoscopy as well.  The interim he will continue to monitor his weight and notify us if he develops a 5 pound weight loss.  Continue gastroparesis diet.

## 2018-05-31 NOTE — Progress Notes (Signed)
cc'ed to pcp °

## 2018-06-12 ENCOUNTER — Other Ambulatory Visit (HOSPITAL_COMMUNITY): Payer: Self-pay | Admitting: *Deleted

## 2018-06-12 DIAGNOSIS — C3490 Malignant neoplasm of unspecified part of unspecified bronchus or lung: Secondary | ICD-10-CM

## 2018-06-12 MED ORDER — LORAZEPAM 1 MG PO TABS: 1.0000 mg | ORAL_TABLET | Freq: Two times a day (BID) | ORAL | 0 refills | Status: DC

## 2018-06-15 ENCOUNTER — Inpatient Hospital Stay (HOSPITAL_COMMUNITY): Payer: Medicare Other | Attending: Hematology

## 2018-06-15 ENCOUNTER — Other Ambulatory Visit: Payer: Self-pay

## 2018-06-15 DIAGNOSIS — C3431 Malignant neoplasm of lower lobe, right bronchus or lung: Secondary | ICD-10-CM | POA: Diagnosis not present

## 2018-06-15 DIAGNOSIS — Z720 Tobacco use: Secondary | ICD-10-CM | POA: Diagnosis not present

## 2018-06-15 DIAGNOSIS — C3490 Malignant neoplasm of unspecified part of unspecified bronchus or lung: Secondary | ICD-10-CM

## 2018-06-15 LAB — COMPREHENSIVE METABOLIC PANEL
ALT: 16 U/L (ref 0–44)
AST: 18 U/L (ref 15–41)
Albumin: 3.7 g/dL (ref 3.5–5.0)
Alkaline Phosphatase: 78 U/L (ref 38–126)
Anion gap: 9 (ref 5–15)
BUN: 13 mg/dL (ref 8–23)
CO2: 28 mmol/L (ref 22–32)
Calcium: 8.8 mg/dL — ABNORMAL LOW (ref 8.9–10.3)
Chloride: 102 mmol/L (ref 98–111)
Creatinine, Ser: 0.95 mg/dL (ref 0.61–1.24)
GFR calc Af Amer: >60 mL/min (ref >60)
GFR calc non Af Amer: >60 mL/min (ref >60)
Glucose, Bld: 124 mg/dL — ABNORMAL HIGH (ref 70–99)
Potassium: 4 mmol/L (ref 3.5–5.1)
Sodium: 139 mmol/L (ref 135–145)
Total Bilirubin: 0.7 mg/dL (ref 0.3–1.2)
Total Protein: 8 g/dL (ref 6.5–8.1)

## 2018-06-15 LAB — CBC WITH DIFFERENTIAL/PLATELET
Abs Immature Granulocytes: 0.02 10*3/uL (ref 0.00–0.07)
Basophils Absolute: 0.1 10*3/uL (ref 0.0–0.1)
Basophils Relative: 1 %
Eosinophils Absolute: 0.3 10*3/uL (ref 0.0–0.5)
Eosinophils Relative: 5 %
HCT: 42 % (ref 39.0–52.0)
Hemoglobin: 13.2 g/dL (ref 13.0–17.0)
Immature Granulocytes: 0 %
Lymphocytes Relative: 20 %
Lymphs Abs: 1.2 10*3/uL (ref 0.7–4.0)
MCH: 31.9 pg (ref 26.0–34.0)
MCHC: 31.4 g/dL (ref 30.0–36.0)
MCV: 101.4 fL — ABNORMAL HIGH (ref 80.0–100.0)
Monocytes Absolute: 0.6 10*3/uL (ref 0.1–1.0)
Monocytes Relative: 10 %
Neutro Abs: 3.9 10*3/uL (ref 1.7–7.7)
Neutrophils Relative %: 64 %
Platelets: 140 10*3/uL — ABNORMAL LOW (ref 150–400)
RBC: 4.14 MIL/uL — ABNORMAL LOW (ref 4.22–5.81)
RDW: 15 % (ref 11.5–15.5)
WBC: 6 10*3/uL (ref 4.0–10.5)
nRBC: 0 % (ref 0.0–0.2)

## 2018-06-16 ENCOUNTER — Other Ambulatory Visit: Payer: Self-pay

## 2018-06-16 ENCOUNTER — Encounter (HOSPITAL_COMMUNITY)
Admission: RE | Admit: 2018-06-16 | Discharge: 2018-06-16 | Disposition: A | Discharge status: Home / Self Care | Payer: Medicare Other | Source: Ambulatory Visit | Attending: Nurse Practitioner | Admitting: Nurse Practitioner

## 2018-06-16 DIAGNOSIS — I2510 Atherosclerotic heart disease of native coronary artery without angina pectoris: Secondary | ICD-10-CM | POA: Insufficient documentation

## 2018-06-16 DIAGNOSIS — C3490 Malignant neoplasm of unspecified part of unspecified bronchus or lung: Secondary | ICD-10-CM | POA: Diagnosis not present

## 2018-06-16 DIAGNOSIS — J439 Emphysema, unspecified: Secondary | ICD-10-CM | POA: Diagnosis not present

## 2018-06-16 DIAGNOSIS — C3491 Malignant neoplasm of unspecified part of right bronchus or lung: Secondary | ICD-10-CM | POA: Diagnosis not present

## 2018-06-16 LAB — GLUCOSE, CAPILLARY: Glucose-Capillary: 103 mg/dL — ABNORMAL HIGH (ref 70–99)

## 2018-06-16 MED ORDER — FLUDEOXYGLUCOSE F - 18 (FDG) INJECTION
7.0300 | Freq: Once | INTRAVENOUS | Status: AC | PRN
  Administered 2018-06-16: 7.03 via INTRAVENOUS

## 2018-06-19 ENCOUNTER — Other Ambulatory Visit: Payer: Self-pay

## 2018-06-19 ENCOUNTER — Inpatient Hospital Stay (HOSPITAL_BASED_OUTPATIENT_CLINIC_OR_DEPARTMENT_OTHER): Payer: Medicare Other | Admitting: Hematology

## 2018-06-19 ENCOUNTER — Encounter (HOSPITAL_COMMUNITY): Payer: Self-pay | Admitting: Hematology

## 2018-06-19 VITALS — BP 128/75 | HR 74 | Temp 97.6°F | Wt 143.5 lb

## 2018-06-19 DIAGNOSIS — C3431 Malignant neoplasm of lower lobe, right bronchus or lung: Secondary | ICD-10-CM | POA: Diagnosis not present

## 2018-06-19 DIAGNOSIS — Z720 Tobacco use: Secondary | ICD-10-CM

## 2018-06-19 DIAGNOSIS — C3490 Malignant neoplasm of unspecified part of unspecified bronchus or lung: Secondary | ICD-10-CM

## 2018-06-19 NOTE — Patient Instructions (Addendum)
[place] at [facility] Discharge Instructions  You were seen today by Dr. Delton Coombes. He went over your recent scan results, everything looked good. He will see you back in 3 months for labs and follow up.   Thank you for choosing [place] at [facility] to provide your oncology and hematology care.  To afford each patient quality time with our provider, please arrive at least 15 minutes before your scheduled appointment time.   If you have a lab appointment with the [facility] please come in thru the  Main Entrance and check in at the main information desk  You need to re-schedule your appointment should you arrive 10 or more minutes late.  We strive to give you quality time with our providers, and arriving late affects you and other patients whose appointments are after yours.  Also, if you no show three or more times for appointments you may be dismissed from the clinic at the providers discretion.     Again, thank you for choosing [facility].  Our hope is that these requests will decrease the amount of time that you wait before being seen by our physicians.       _____________________________________________________________  Should you have questions after your visit to [facility], please contact our office at [phone] between the hours of 8:00 a.m. and 4:30 p.m.  Voicemails left after 4:00 p.m. will not be returned until the following business day.  For prescription refill requests, have your pharmacy contact our office and allow 72 hours.    Cancer Center Support Programs:   > Cancer Support Group  2nd Tuesday of the month 1pm-2pm, Journey Room

## 2018-06-19 NOTE — Assessment & Plan Note (Signed)
1.  Limited stage small cell lung cancer: - CT abdomen pelvis on 11/08/2017 showed irregular narrowing of the right lower lobe bronchus associated with right hilar adenopathy.  - PET CT scan on 11/28/2017 shows lesion at the inferior lobar bronchus with SUV of 5.5 with right hilar hypermetabolic and ipsilateral mediastinal adenopathy including right paratracheal, subcarinal and several small prevascular lymph nodes which demonstrate only low-grade metabolic activity.  - Bronchoscopy and EBUS biopsy on 12/23/2017 shows small cell carcinoma.  Lymph node aspirations were also consistent with small cell lung cancer.  - Patient is a current active smoker, smokes 1 pack/day for 60+ years.  He also had a brief exposure to asbestos.  He is a [place] [employer] veteran, was not sure whether he was exposed to agent orange. - MRI of the brain on 01/06/2018 was negative for metastatic disease. -Chemoradiation therapy with carboplatin and etoposide 4 cycles from 01/18/2018 through 03/27/2018.   - PET CT scan on 04/13/2018 shows right paratracheal lymph node measuring 1.1 cm with SUV of 4.7.  There is plugging of the bronchus intermedius with peribronchovascular thickening and density in the right hilar and infrahilar region with associated accentuated activity.  There was a new activity at L1 vertebral body.  Adenopathy in the chest has improved.  -MRI of the lumbar spine on 04/21/2018 did not show any evidence of metastatic disease.  -He had MRI of the brain with the Dr.Yanagihara.  He was offered PCI but declined. - He is feeling better but his energy is still lacking. -We reviewed results of the PET scan dated 06/16/2018 which shows further decrease in size and hypermetabolism of the mediastinal node.  No progressive disease identified.  Right-sided areas of pulmonary hypermetabolic him, interstitial thickening and mild airspace disease, favoring infection/aspiration.  Areas of primarily right-sided bronchial wall thickening and  mucous plugging have significantly improved.  Resolution of L1 hypermetabolism without evidence of metastatic disease. -He does not have any evidence of infection at this time. -I will see him back in 3 months for follow-up with repeat scans.  He was advised to follow-up with Dr.Yanagihara for follow-up MRI.

## 2018-06-19 NOTE — Progress Notes (Signed)
[place], [place] [address]   CLINIC:  Medical Oncology/Hematology  PCP:  [facility] [place] [id number] [phone]   REASON FOR VISIT:  Follow-up for limited stage small cell lung  CURRENT THERAPY: observation   BRIEF ONCOLOGIC HISTORY:    Small cell lung cancer ([place])   12/30/2017 Initial Diagnosis    Small cell lung cancer ([place])    01/18/2018 -  Chemotherapy    The patient had palonosetron (ALOXI) injection 0.25 mg, 0.25 mg, Intravenous,  Once, 4 of 4 cycles Administration: 0.25 mg (01/18/2018), 0.25 mg (02/08/2018), 0.25 mg (03/06/2018), 0.25 mg (03/27/2018) CARBOplatin (PARAPLATIN) 300 mg in sodium chloride 0.9 % 250 mL chemo infusion, 300 mg (100 % of original dose 300 mg), Intravenous,  Once, 4 of 4 cycles Dose modification: 300 mg (original dose 300 mg, Cycle 1), 409.5 mg (original dose 409.5 mg, Cycle 2),   (original dose 409.5 mg, Cycle 3), 409.5 mg (original dose 409.5 mg, Cycle 4) Administration: 300 mg (01/18/2018), 410 mg (02/08/2018), 410 mg (03/06/2018), 410 mg (03/27/2018) etoposide (VEPESID) 160 mg in sodium chloride 0.9 % 500 mL chemo infusion, 90 mg/m2 = 160 mg (90 % of original dose 100 mg/m2), Intravenous,  Once, 4 of 4 cycles Dose modification: 90 mg/m2 (90 % of original dose 100 mg/m2, Cycle 1, Reason: Patient Age) Administration: 160 mg (01/18/2018), 170 mg (01/19/2018), 170 mg (02/10/2018), 170 mg (02/08/2018), 170 mg (02/09/2018), 170 mg (01/20/2018), 170 mg (03/07/2018), 170 mg (03/06/2018), 170 mg (03/08/2018), 170 mg (03/27/2018), 170 mg (03/28/2018), 170 mg (03/29/2018) fosaprepitant (EMEND) 150 mg, dexamethasone (DECADRON) 12 mg in sodium chloride 0.9 % 145 mL IVPB, , Intravenous,  Once, 4 of 4 cycles Administration:  (01/18/2018),  (02/08/2018),  (03/06/2018),  (03/27/2018)  for chemotherapy treatment.       CANCER STAGING: Cancer Staging No matching staging information was found for the  patient.   INTERVAL HISTORY:  Terry Boyd 78 y.o. male returns for routine follow-up. He is here today alone. He states that he has been doing good since his last visit. Denies any nausea, vomiting, or diarrhea. Denies any new pains. Had not noticed any recent bleeding such as epistaxis, hematuria or hematochezia. Denies recent chest pain on exertion, shortness of breath on minimal exertion, pre-syncopal episodes, or palpitations. Denies any numbness or tingling in hands or feet. Denies any recent fevers, infections, or recent hospitalizations. Patient reports appetite at 75% and energy level at 25%.     REVIEW OF SYSTEMS:  Review of Systems  Constitutional: Positive for fatigue.  All other systems reviewed and are negative.    PAST MEDICAL/SURGICAL HISTORY:  Past Medical History:  Diagnosis Date   AAA (abdominal aortic aneurysm) (HCC)    Alternating constipation and diarrhea    Arthritis    Chronic back pain    COPD (chronic obstructive pulmonary disease) (HCC)    Depression    GERD (gastroesophageal reflux disease)    History of hiatal hernia    Hypertension    not on medications   Hypothyroidism    Lung cancer ([place]) 2019   Peripheral vascular disease ([place])    Smoker    Thyroid disease    Past Surgical History:  Procedure Laterality Date   ABDOMINAL AORTAGRAM N/A 03/14/2012   Procedure: ABDOMINAL Maxcine Ham;  Surgeon: Serafina Mitchell, MD;  Location: [facility] CATH LAB;  Service: Cardiovascular;  Laterality: N/A;   ABDOMINAL AORTIC ANEURYSM REPAIR  04-22-2009   Stent  graft repair of AAA   APPENDECTOMY     BRONCHIAL BRUSHINGS Right 11/30/2017   Procedure: BRONCHIAL BRUSHINGS;  Surgeon: Sinda Du, MD;  [facility];  Service: Cardiopulmonary;  Laterality: Right;   BRONCHIAL WASHINGS Right 11/30/2017   Procedure: BRONCHIAL WASHINGS;  Surgeon: Sinda Du, MD;  [facility];  Service: Cardiopulmonary;  Laterality: Right;   BYPASS  GRAFT POPLITEAL TO POPLITEAL  03/20/2012   Procedure: BYPASS GRAFT POPLITEAL TO POPLITEAL;  Surgeon: Elam Dutch, MD;  Location: [facility];  Service: Vascular;  Laterality: Left;  ABOVE THE KNEE POPLITEAL ARTERY TO BELOW THE KNEE POPLITEAL ARTERY BYPASS GRAFT USING REVERSE LEFT GREATER SAPHENOUS VEIN    ENDARTERECTOMY POPLITEAL  03/20/2012   Procedure: ENDARTERECTOMY POPLITEAL;  Surgeon: Elam Dutch, MD;  Location: [facility] OR;  Service: Vascular;  Laterality: Left;   LIGATION OF LEFT LEG POPLITEAL ANEURYSM   ESOPHAGOGASTRODUODENOSCOPY  09/28/2017   Dr. Lurlean Nanny: Medium sized hiatal hernia, residual food.  Suspect gastroparesis.  Gastric atony.   ESOPHAGOGASTRODUODENOSCOPY  01/05/2018   [place].  Large amount of solid food upon entry to the stomach.  Pylorus appeared patent.  Normal duodenal mucosa without evidence of obstruction to the second portion of duodenum.  Balloon dilation performed of the pylorus.   FLEXIBLE BRONCHOSCOPY N/A 11/30/2017   Procedure: FLEXIBLE BRONCHOSCOPY;  Surgeon: Sinda Du, MD;  [facility];  Service: Cardiopulmonary;  Laterality: N/A;   INTRAOPERATIVE ARTERIOGRAM  03/20/2012   Procedure: INTRA OPERATIVE ARTERIOGRAM;  Surgeon: Elam Dutch, MD;  Location: [facility];  Service: Vascular;  Laterality: Left;  TIMES ONE   PORTACATH PLACEMENT Left 01/06/2018   Procedure: INSERTION PORT-A-CATH;  Surgeon: Aviva Signs, MD;  Location: [facility] ORS;  Service: General;  Laterality: Left;   [place] N/A 12/23/2017   Procedure: VIDEO BRONCHOSCOPY WITH ENDOBRONCHIAL ULTRASOUND;  Surgeon: Melrose Nakayama, MD;  Location: [facility];  Service: Thoracic;  Laterality: N/A;     SOCIAL HISTORY:  Social History   Socioeconomic History   Marital status: Divorced    Spouse name: Not on file   Number of children: 2   Years of education: Not on file   Highest education level: Not on file    Occupational History   Occupation: [place]     Comment: Agent Orange exposure   Occupation: Sales executive    Comment: [employer] exsposure  Social Designer, fashion/clothing strain: Not hard at [employer] insecurity:    Worry: Never true    Inability: Never true   Transportation needs:    Medical: No    Non-medical: No  Tobacco Use   Smoking status: Current Every Day Smoker    Years: 62.00    Types: Cigars   Smokeless tobacco: Never Used   Tobacco comment: pt states he smokes about 15 cigars per day  Substance and Sexual Activity   Alcohol use: No   Drug use: No   Sexual activity: Yes    Birth control/protection: None  Lifestyle   Physical activity:    Days per week: 0 days    Minutes per session: 0 min   Stress: To some extent  Relationships   Social connections:    Talks on phone: More than three times a week    Gets together: Once a week    Attends religious service: More than 4 times per year    Active member of club or  organization: No    Attends meetings of clubs or organizations: Never    Relationship status: Divorced   Intimate partner violence:    Fear of current or ex partner: No    Emotionally abused: No    Physically abused: No    Forced sexual activity: No  Other Topics Concern   Not on file  Social History Narrative   Sedentary    FAMILY HISTORY:  Family History  Problem Relation Age of Onset   Diabetes Mother    Stroke Mother    Heart disease Father    Diabetes Father    Heart disease Sister        CABG x 4   Cancer Brother        throat cancer   Diabetes Sister    Heart disease Sister    Cancer Brother        pancreatic   Prostate cancer Brother    Kidney disease Son    Colon cancer Neg Hx     CURRENT MEDICATIONS:  Outpatient Encounter Medications as of 06/19/2018  Medication Sig   Ipratropium-Albuterol (COMBIVENT RESPIMAT) 20-100 MCG/ACT AERS respimat Inhale 1 puff into the lungs every 6 (six)  hours as needed for wheezing.   levothyroxine (SYNTHROID, LEVOTHROID) 137 MCG tablet Take 137 mcg by mouth daily before breakfast.    LORazepam (ATIVAN) 1 MG tablet Take 1 tablet (1 mg total) by mouth 2 (two) times daily.   omeprazole (PRILOSEC) 20 MG capsule Take 20 mg by mouth daily.   prochlorperazine (COMPAZINE) 10 MG tablet Take 1 tablet (10 mg total) by mouth every 6 (six) hours as needed (Nausea or vomiting).   sertraline (ZOLOFT) 50 MG tablet Take 50 mg by mouth every morning.    simvastatin (ZOCOR) 10 MG tablet Take 10 mg by mouth at bedtime.   traMADol (ULTRAM) 50 MG tablet Take 1 tablet (50 mg total) by mouth every 6 (six) hours as needed.   No facility-administered encounter medications on file as of 06/19/2018.     ALLERGIES:  Allergies  Allergen Reactions   Morphine And Related Shortness Of Breath and Other (See Comments)    Hallucinations pt has taken hydromorphone before     PHYSICAL EXAM:  ECOG Performance status: 1  Vitals:   06/19/18 1157  BP: 128/75  Pulse: 74  Temp: 97.6 F (36.4 C)   Filed Weights   06/19/18 1157  Weight: 143 lb 8 oz (65.1 kg)    Physical Exam Vitals signs reviewed.  Constitutional:      Appearance: Normal appearance.  Cardiovascular:     Rate and Rhythm: Normal rate and regular rhythm.     Heart sounds: Normal heart sounds.  Pulmonary:     Effort: Pulmonary effort is normal.     Breath sounds: Normal breath sounds.  Abdominal:     General: There is no distension.     Palpations: Abdomen is soft. There is no mass.  Musculoskeletal:        General: No swelling.  Skin:    General: Skin is warm.  Neurological:     General: No focal deficit present.     Mental Status: He is alert and oriented to person, place, and time.  Psychiatric:        Mood and Affect: Mood normal.        Behavior: Behavior normal.      LABORATORY DATA:  I have reviewed the labs as listed.  CBC    Component Value  Date/Time   WBC 6.0  06/15/2018 1019   RBC 4.14 (L) 06/15/2018 1019   HGB 13.2 06/15/2018 1019   HCT 42.0 06/15/2018 1019   PLT 140 (L) 06/15/2018 1019   MCV 101.4 (H) 06/15/2018 1019   MCH 31.9 06/15/2018 1019   MCHC 31.4 06/15/2018 1019   RDW 15.0 06/15/2018 1019   LYMPHSABS 1.2 06/15/2018 1019   MONOABS 0.6 06/15/2018 1019   EOSABS 0.3 06/15/2018 1019   BASOSABS 0.1 06/15/2018 1019   CMP Latest Ref Rng & Units 06/15/2018 05/04/2018 04/14/2018  Glucose 70 - 99 mg/dL 124(H) 109(H) 117(H)  BUN 8 - 23 mg/dL _0 Creatinine 0.61 - 1.24 mg/dL 0.95 0.82 0.69  Sodium 135 - 145 mmol/L 139 139 136  Potassium 3.5 - 5.1 mmol/L 4.0 5.1 3.6  Chloride 98 - 111 mmol/L 102 103 99  CO2 22 - 32 mmol/L _1 Calcium 8.9 - 10.3 mg/dL 8.8(L) 8.8(L) 8.1(L)  Total Protein 6.5 - 8.1 g/dL 8.0 7.3 6.9  Total Bilirubin 0.3 - 1.2 mg/dL 0.7 0.5 0.5  Alkaline Phos 38 - 126 U/L 78 81 65  AST 15 - 41 U/L _2 ALT 0 - 44 U/L _3 DIAGNOSTIC IMAGING:  I have independently reviewed the scans and discussed with the patient.   I have reviewed Venita Lick LPN's note and agree with the documentation.  I personally performed a face-to-face visit, made revisions and my assessment and plan is as follows.    ASSESSMENT & PLAN:   Small cell lung cancer ([place]) 1.  Limited stage small cell lung cancer: - CT abdomen pelvis on 11/08/2017 showed irregular narrowing of the right lower lobe bronchus associated with right hilar adenopathy.  - PET CT scan on 11/28/2017 shows lesion at the inferior lobar bronchus with SUV of 5.5 with right hilar hypermetabolic and ipsilateral mediastinal adenopathy including right paratracheal, subcarinal and several small prevascular lymph nodes which demonstrate only low-grade metabolic activity.  - Bronchoscopy and EBUS biopsy on 12/23/2017 shows small cell carcinoma.  Lymph node aspirations were also consistent with small cell lung cancer.  - Patient is a current active smoker, smokes  1 pack/day for 60+ years.  He also had a brief exposure to asbestos.  He is a [place] [employer] veteran, was not sure whether he was exposed to agent orange. - MRI of the brain on 01/06/2018 was negative for metastatic disease. -Chemoradiation therapy with carboplatin and etoposide 4 cycles from 01/18/2018 through 03/27/2018.   - PET CT scan on 04/13/2018 shows right paratracheal lymph node measuring 1.1 cm with SUV of 4.7.  There is plugging of the bronchus intermedius with peribronchovascular thickening and density in the right hilar and infrahilar region with associated accentuated activity.  There was a new activity at L1 vertebral body.  Adenopathy in the chest has improved.  -MRI of the lumbar spine on 04/21/2018 did not show any evidence of metastatic disease.  -He had MRI of the brain with the Dr.Yanagihara.  He was offered PCI but declined. - He is feeling better but his energy is still lacking. -We reviewed results of the PET scan dated 06/16/2018 which shows further decrease in size and hypermetabolism of the mediastinal node.  No progressive disease identified.  Right-sided areas of pulmonary hypermetabolic him, interstitial thickening and mild airspace disease, favoring infection/aspiration.  Areas of primarily right-sided bronchial wall thickening and mucous plugging have significantly improved.  Resolution of  L1 hypermetabolism without evidence of metastatic disease. -He does not have any evidence of infection at this time. -I will see him back in 3 months for follow-up with repeat scans.  He was advised to follow-up with Dr.Yanagihara for follow-up MRI.        Orders placed this encounter:  Orders Placed This Encounter  Procedures   NM PET Image Restag (PS) Skull Base To Thigh      Terry Jack, MD [place] [phone]

## 2018-06-20 ENCOUNTER — Ambulatory Visit (HOSPITAL_COMMUNITY): Payer: Medicare Other | Admitting: Hematology

## 2018-06-27 DIAGNOSIS — C3491 Malignant neoplasm of unspecified part of right bronchus or lung: Secondary | ICD-10-CM | POA: Diagnosis not present

## 2018-06-27 DIAGNOSIS — C3401 Malignant neoplasm of right main bronchus: Secondary | ICD-10-CM | POA: Diagnosis not present

## 2018-07-12 ENCOUNTER — Other Ambulatory Visit (HOSPITAL_COMMUNITY): Payer: Self-pay | Admitting: *Deleted

## 2018-07-12 DIAGNOSIS — C3490 Malignant neoplasm of unspecified part of unspecified bronchus or lung: Secondary | ICD-10-CM

## 2018-07-12 MED ORDER — LORAZEPAM 1 MG PO TABS: 1.0000 mg | ORAL_TABLET | Freq: Two times a day (BID) | ORAL | 0 refills | Status: DC

## 2018-08-09 ENCOUNTER — Other Ambulatory Visit (HOSPITAL_COMMUNITY): Payer: Self-pay | Admitting: *Deleted

## 2018-08-09 DIAGNOSIS — C3490 Malignant neoplasm of unspecified part of unspecified bronchus or lung: Secondary | ICD-10-CM

## 2018-08-09 MED ORDER — LORAZEPAM 1 MG PO TABS: 1.0000 mg | ORAL_TABLET | Freq: Two times a day (BID) | ORAL | 0 refills | Status: DC

## 2018-08-29 DIAGNOSIS — I10 Essential (primary) hypertension: Secondary | ICD-10-CM | POA: Diagnosis not present

## 2018-08-29 DIAGNOSIS — E039 Hypothyroidism, unspecified: Secondary | ICD-10-CM | POA: Diagnosis not present

## 2018-08-29 DIAGNOSIS — J449 Chronic obstructive pulmonary disease, unspecified: Secondary | ICD-10-CM | POA: Diagnosis not present

## 2018-09-04 ENCOUNTER — Other Ambulatory Visit (HOSPITAL_COMMUNITY): Payer: Medicare Other

## 2018-09-04 ENCOUNTER — Ambulatory Visit (HOSPITAL_COMMUNITY): Payer: Medicare Other

## 2018-09-05 ENCOUNTER — Other Ambulatory Visit (HOSPITAL_COMMUNITY): Payer: Self-pay | Admitting: Nurse Practitioner

## 2018-09-05 DIAGNOSIS — C3490 Malignant neoplasm of unspecified part of unspecified bronchus or lung: Secondary | ICD-10-CM

## 2018-09-07 ENCOUNTER — Ambulatory Visit (HOSPITAL_COMMUNITY): Payer: Medicare Other | Admitting: Hematology

## 2018-09-11 ENCOUNTER — Other Ambulatory Visit (HOSPITAL_COMMUNITY): Payer: Self-pay | Admitting: *Deleted

## 2018-09-11 DIAGNOSIS — C3490 Malignant neoplasm of unspecified part of unspecified bronchus or lung: Secondary | ICD-10-CM

## 2018-09-11 MED ORDER — LORAZEPAM 1 MG PO TABS: 1.0000 mg | ORAL_TABLET | Freq: Two times a day (BID) | ORAL | 0 refills | Status: DC

## 2018-09-26 ENCOUNTER — Ambulatory Visit (HOSPITAL_COMMUNITY)
Admission: RE | Admit: 2018-09-26 | Discharge: 2018-09-26 | Disposition: A | Discharge status: Home / Self Care | Payer: Medicare Other | Source: Ambulatory Visit | Attending: Nurse Practitioner | Admitting: Nurse Practitioner

## 2018-09-26 ENCOUNTER — Inpatient Hospital Stay (HOSPITAL_COMMUNITY): Payer: Medicare Other | Attending: Hematology

## 2018-09-26 ENCOUNTER — Other Ambulatory Visit: Payer: Self-pay

## 2018-09-26 DIAGNOSIS — J432 Centrilobular emphysema: Secondary | ICD-10-CM | POA: Diagnosis not present

## 2018-09-26 DIAGNOSIS — C3490 Malignant neoplasm of unspecified part of unspecified bronchus or lung: Secondary | ICD-10-CM | POA: Insufficient documentation

## 2018-09-26 DIAGNOSIS — Z720 Tobacco use: Secondary | ICD-10-CM | POA: Diagnosis not present

## 2018-09-26 DIAGNOSIS — J479 Bronchiectasis, uncomplicated: Secondary | ICD-10-CM | POA: Diagnosis not present

## 2018-09-26 DIAGNOSIS — C3491 Malignant neoplasm of unspecified part of right bronchus or lung: Secondary | ICD-10-CM | POA: Insufficient documentation

## 2018-09-26 DIAGNOSIS — J9809 Other diseases of bronchus, not elsewhere classified: Secondary | ICD-10-CM | POA: Diagnosis not present

## 2018-09-26 DIAGNOSIS — R5383 Other fatigue: Secondary | ICD-10-CM | POA: Diagnosis not present

## 2018-09-26 DIAGNOSIS — D3501 Benign neoplasm of right adrenal gland: Secondary | ICD-10-CM | POA: Diagnosis not present

## 2018-09-26 DIAGNOSIS — J984 Other disorders of lung: Secondary | ICD-10-CM | POA: Diagnosis not present

## 2018-09-26 LAB — COMPREHENSIVE METABOLIC PANEL
ALT: 16 U/L (ref 0–44)
AST: 17 U/L (ref 15–41)
Albumin: 4.1 g/dL (ref 3.5–5.0)
Alkaline Phosphatase: 70 U/L (ref 38–126)
Anion gap: 9 (ref 5–15)
BUN: 17 mg/dL (ref 8–23)
CO2: 29 mmol/L (ref 22–32)
Calcium: 9.1 mg/dL (ref 8.9–10.3)
Chloride: 102 mmol/L (ref 98–111)
Creatinine, Ser: 1.09 mg/dL (ref 0.61–1.24)
GFR calc Af Amer: >60 mL/min (ref >60)
GFR calc non Af Amer: >60 mL/min (ref >60)
Glucose, Bld: 115 mg/dL — ABNORMAL HIGH (ref 70–99)
Potassium: 4 mmol/L (ref 3.5–5.1)
Sodium: 140 mmol/L (ref 135–145)
Total Bilirubin: 0.5 mg/dL (ref 0.3–1.2)
Total Protein: 7.7 g/dL (ref 6.5–8.1)

## 2018-09-26 LAB — CBC WITH DIFFERENTIAL/PLATELET
Abs Immature Granulocytes: 0.03 10*3/uL (ref 0.00–0.07)
Basophils Absolute: 0.1 10*3/uL (ref 0.0–0.1)
Basophils Relative: 1 %
Eosinophils Absolute: 0.3 10*3/uL (ref 0.0–0.5)
Eosinophils Relative: 4 %
HCT: 43.7 % (ref 39.0–52.0)
Hemoglobin: 14.1 g/dL (ref 13.0–17.0)
Immature Granulocytes: 1 %
Lymphocytes Relative: 22 %
Lymphs Abs: 1.4 10*3/uL (ref 0.7–4.0)
MCH: 31.5 pg (ref 26.0–34.0)
MCHC: 32.3 g/dL (ref 30.0–36.0)
MCV: 97.8 fL (ref 80.0–100.0)
Monocytes Absolute: 0.7 10*3/uL (ref 0.1–1.0)
Monocytes Relative: 11 %
Neutro Abs: 3.8 10*3/uL (ref 1.7–7.7)
Neutrophils Relative %: 61 %
Platelets: 143 10*3/uL — ABNORMAL LOW (ref 150–400)
RBC: 4.47 MIL/uL (ref 4.22–5.81)
RDW: 13.3 % (ref 11.5–15.5)
WBC: 6.2 10*3/uL (ref 4.0–10.5)
nRBC: 0 % (ref 0.0–0.2)

## 2018-09-26 MED ORDER — IOHEXOL 300 MG/ML  SOLN
100.0000 mL | Freq: Once | INTRAMUSCULAR | Status: AC | PRN
  Administered 2018-09-26: 09:00:00 100 mL via INTRAVENOUS

## 2018-09-27 ENCOUNTER — Inpatient Hospital Stay (HOSPITAL_BASED_OUTPATIENT_CLINIC_OR_DEPARTMENT_OTHER): Payer: Medicare Other | Admitting: Hematology

## 2018-09-27 ENCOUNTER — Encounter (HOSPITAL_COMMUNITY): Payer: Self-pay | Admitting: Hematology

## 2018-09-27 VITALS — BP 124/76 | HR 80 | Temp 97.8°F | Resp 16 | Wt 151.8 lb

## 2018-09-27 DIAGNOSIS — G479 Sleep disorder, unspecified: Secondary | ICD-10-CM

## 2018-09-27 DIAGNOSIS — Z720 Tobacco use: Secondary | ICD-10-CM | POA: Diagnosis not present

## 2018-09-27 DIAGNOSIS — C3490 Malignant neoplasm of unspecified part of unspecified bronchus or lung: Secondary | ICD-10-CM

## 2018-09-27 DIAGNOSIS — R5383 Other fatigue: Secondary | ICD-10-CM | POA: Diagnosis not present

## 2018-09-27 DIAGNOSIS — C3491 Malignant neoplasm of unspecified part of right bronchus or lung: Secondary | ICD-10-CM | POA: Diagnosis not present

## 2018-09-27 NOTE — Assessment & Plan Note (Signed)
1.  Limited stage small cell lung cancer: - Bronchoscopy and biopsy on 12/23/2017 shows small cell carcinoma.  Lymph node aspirations were also positive. - Chemoradiation therapy with carboplatin and etoposide 4 cycles from 01/18/2018 through 03/27/2018. - He was offered but declined PCI. - PET scan on 06/16/2018 showed further decrease in size and hypermetabolic about the mediastinal lymph nodes.  No progressive disease.  Right-sided areas of pulmonary hypermetabolism, interstitial thickening and mild airspace disease favoring infection/aspiration.  Resolution of L1 hypermetabolism without evidence of metastatic disease. - We reviewed results of the CT of [facility] on 09/26/2018.in the periphery of the right lung, there is a new pleural-based nodular opacity associated with the lateral aspect of the minor fissure measuring 1.2 x 1.8 cm. -I have reviewed his prior PET scan images.  This lesion was present at that time. - I have recommended a follow-up CT scan of the chest with contrast in 3 months. - He has stopped follow-up visits with Dr.Yanagihara.  He does not want to have an MRI of the brain as it hurts his eyes. -I have recommended doing a CT of the brain with and without contrast prior to next visit.  We will closely monitor his brain with scans as it is most likely site of metastatic disease.

## 2018-09-27 NOTE — Patient Instructions (Addendum)
[place] at [facility] Discharge Instructions  You were seen today by Dr. Delton Coombes. He discussed your recent scans. He talked about a spot that we found on the right lung but it has been there for a while and hasn't changed much since the previous scan.  Dr. Delton Coombes feels that we just need to watch this spot.  We will do a follow up CT scan in 3 months.  We will also a CT scan of the brain instead of an MRI.   He looked at that spot on your ear. It is a cyst, very benign area. Nothing for [place] to do with that area. We will see you back in 3 months after scans.   Thank you for choosing [place] at [facility] to provide your oncology and hematology care.  To afford each patient quality time with our provider, please arrive at least 15 minutes before your scheduled appointment time.   If you have a lab appointment with the [facility] please come in thru the  Main Entrance and check in at the main information desk  You need to re-schedule your appointment should you arrive 10 or more minutes late.  We strive to give you quality time with our providers, and arriving late affects you and other patients whose appointments are after yours.  Also, if you no show three or more times for appointments you may be dismissed from the clinic at the providers discretion.     Again, thank you for choosing [facility].  Our hope is that these requests will decrease the amount of time that you wait before being seen by our physicians.       _____________________________________________________________  Should you have questions after your visit to [facility], please contact our office at [phone] between the hours of 8:00 a.m. and 4:30 p.m.  Voicemails left after 4:00 p.m. will not be returned until the following business day.  For prescription refill requests, have your pharmacy contact our office and allow 72 hours.    Cancer  Center Support Programs:   > Cancer Support Group  2nd Tuesday of the month 1pm-2pm, Journey Room

## 2018-09-27 NOTE — Progress Notes (Signed)
[place], [place] [address]   CLINIC:  Medical Oncology/Hematology  PCP:  Rory Percy, MD [place] [phone]   REASON FOR VISIT:  Follow-up for limited stage small cell lung  CURRENT THERAPY: observation   BRIEF ONCOLOGIC HISTORY:  Oncology History  Small cell lung cancer ([place])  12/30/2017 Initial Diagnosis   Small cell lung cancer ([place])   01/18/2018 -  Chemotherapy   The patient had palonosetron (ALOXI) injection 0.25 mg, 0.25 mg, Intravenous,  Once, 4 of 4 cycles Administration: 0.25 mg (01/18/2018), 0.25 mg (02/08/2018), 0.25 mg (03/06/2018), 0.25 mg (03/27/2018) CARBOplatin (PARAPLATIN) 300 mg in sodium chloride 0.9 % 250 mL chemo infusion, 300 mg (100 % of original dose 300 mg), Intravenous,  Once, 4 of 4 cycles Dose modification: 300 mg (original dose 300 mg, Cycle 1), 409.5 mg (original dose 409.5 mg, Cycle 2),   (original dose 409.5 mg, Cycle 3), 409.5 mg (original dose 409.5 mg, Cycle 4) Administration: 300 mg (01/18/2018), 410 mg (02/08/2018), 410 mg (03/06/2018), 410 mg (03/27/2018) etoposide (VEPESID) 160 mg in sodium chloride 0.9 % 500 mL chemo infusion, 90 mg/m2 = 160 mg (90 % of original dose 100 mg/m2), Intravenous,  Once, 4 of 4 cycles Dose modification: 90 mg/m2 (90 % of original dose 100 mg/m2, Cycle 1, Reason: Patient Age) Administration: 160 mg (01/18/2018), 170 mg (01/19/2018), 170 mg (02/10/2018), 170 mg (02/08/2018), 170 mg (02/09/2018), 170 mg (01/20/2018), 170 mg (03/07/2018), 170 mg (03/06/2018), 170 mg (03/08/2018), 170 mg (03/27/2018), 170 mg (03/28/2018), 170 mg (03/29/2018) fosaprepitant (EMEND) 150 mg, dexamethasone (DECADRON) 12 mg in sodium chloride 0.9 % 145 mL IVPB, , Intravenous,  Once, 4 of 4 cycles Administration:  (01/18/2018),  (02/08/2018),  (03/06/2018),  (03/27/2018)  for chemotherapy treatment.       CANCER STAGING: Cancer Staging No matching staging information was found for the  patient.   INTERVAL HISTORY:  Terry Boyd 78 y.o. male seen for follow-up of small cell lung cancer.  Denies any cough or hemoptysis.  Mild fatigue is stable.  Has difficulty falling asleep at times.  Appetite is 50%.  Energy levels are low.  Denies any ER visits or hospitalizations.  Denies nausea, vomiting, diarrhea or constipation.  No bleeding per rectum or melena reported.     REVIEW OF SYSTEMS:  Review of Systems  Constitutional: Positive for fatigue.  Psychiatric/Behavioral: Positive for sleep disturbance.  All other systems reviewed and are negative.    PAST MEDICAL/SURGICAL HISTORY:  Past Medical History:  Diagnosis Date  . AAA (abdominal aortic aneurysm) ([place])   . Alternating constipation and diarrhea   . Arthritis   . Chronic back pain   . COPD (chronic obstructive pulmonary disease) ([place])   . Depression   . GERD (gastroesophageal reflux disease)   . History of hiatal hernia   . Hypertension    not on medications  . Hypothyroidism   . Lung cancer ([place]) 2019  . Peripheral vascular disease ([place])   . Smoker   . Thyroid disease    Past Surgical History:  Procedure Laterality Date  . ABDOMINAL AORTAGRAM N/A 03/14/2012   Procedure: ABDOMINAL Maxcine Ham;  Surgeon: Serafina Mitchell, MD;  Location: [facility] CATH LAB;  Service: Cardiovascular;  Laterality: N/A;  . ABDOMINAL AORTIC ANEURYSM REPAIR  04-22-2009   Stent graft repair of AAA  . APPENDECTOMY    . BRONCHIAL BRUSHINGS Right 11/30/2017   Procedure: BRONCHIAL BRUSHINGS;  Surgeon: Sinda Du, MD;  [facility] ENDO SU[facility];  Service: Cardiopulmonary;  Laterality: Right;  . BRONCHIAL WASHINGS Right 11/30/2017   Procedure: BRONCHIAL WASHINGS;  Surgeon: Sinda Du, MD;  [facility]: [facility] END[facility];  Service: Cardiopulmonary;  Laterality: Right;  . BYPASS GRAFT POPLITEAL TO POPLITEAL  03/20/2012   Procedure: BYPASS GRAFT POPLITEAL TO POPLITEAL;  Surgeon: Elam Dutch, MD;  Location: [facility];  Service: Vascular;   Laterality: Left;  ABOVE THE KNEE POPLITEAL ARTERY TO BELOW THE KNEE POPLITEAL ARTERY BYPASS GRAFT USING REVERSE LEFT GREATER SAPHENOUS VEIN   . ENDARTERECTOMY POPLITEAL  03/20/2012   Procedure: ENDARTERECTOMY POPLITEAL;  Surgeon: Elam Dutch, MD;  Location: [facility] OR;  Service: Vascular;  Laterality: Left;   LIGATION OF LEFT LEG POPLITEAL ANEURYSM  . ESOPHAGOGASTRODUODENOSCOPY  09/28/2017   Dr. Lurlean Nanny: Medium sized hiatal hernia, residual food.  Suspect gastroparesis.  Gastric atony.  . ESOPHAGOGASTRODUODENOSCOPY  01/05/2018   [place].  Large amount of solid food upon entry to the stomach.  Pylorus appeared patent.  Normal duodenal mucosa without evidence of obstruction to the second portion of duodenum.  Balloon dilation performed of the pylorus.  Marland Kitchen FLEXIBLE BRONCHOSCOPY N/A 11/30/2017   Procedure: FLEXIBLE BRONCHOSCOPY;  Surgeon: Sinda Du, MD;  [facility];  Service: Cardiopulmonary;  Laterality: N/A;  . INTRAOPERATIVE ARTERIOGRAM  03/20/2012   Procedure: INTRA OPERATIVE ARTERIOGRAM;  Surgeon: Elam Dutch, MD;  Location: [facility];  Service: Vascular;  Laterality: Left;  TIMES ONE  . PORTACATH PLACEMENT Left 01/06/2018   Procedure: INSERTION PORT-A-CATH;  Surgeon: Aviva Signs, MD;  Location: [facility] ORS;  Service: General;  Laterality: Left;  Marland Kitchen VASECTOMY  1982  . VIDEO BRONCHOSCOPY WITH ENDOBRONCHIAL ULTRASOUND N/A 12/23/2017   Procedure: VIDEO BRONCHOSCOPY WITH ENDOBRONCHIAL ULTRASOUND;  Surgeon: Melrose Nakayama, MD;  Location: [facility];  Service: Thoracic;  Laterality: N/A;     SOCIAL HISTORY:  Social History   Socioeconomic History  . Marital status: Divorced    Spouse name: Not on file  . Number of children: 2  . Years of education: Not on file  . Highest education level: Not on file  Occupational History  . Occupation: [place]     Comment: Agent Orange exposure  . Occupation: Sales executive    Comment: [place] exsposure  Social Needs  .  Financial resource strain: Not hard at all  . Food insecurity    Worry: Never true    Inability: Never true  . Transportation needs    Medical: No    Non-medical: No  Tobacco Use  . Smoking status: Current Every Day Smoker    Years: 62.00    Types: Cigars  . Smokeless tobacco: Never Used  . Tobacco comment: pt states he smokes about 15 cigars per day  Substance and Sexual Activity  . Alcohol use: No  . Drug use: No  . Sexual activity: Yes    Birth control/protection: None  Lifestyle  . Physical activity    Days per week: 0 days    Minutes per session: 0 min  . Stress: To some extent  Relationships  . Social connections    Talks on phone: More than three times a week    Gets together: Once a week    Attends religious service: More than 4 times per year    Active member of club or organization: No    Attends meetings of clubs or organizations: Never    Relationship status: Divorced  . Intimate partner violence    Fear  of current or ex partner: No    Emotionally abused: No    Physically abused: No    Forced sexual activity: No  Other Topics Concern  . Not on file  Social History Narrative   Sedentary    FAMILY HISTORY:  Family History  Problem Relation Age of Onset  . Diabetes Mother   . Stroke Mother   . Heart disease Father   . Diabetes Father   . Heart disease Sister        CABG x 4  . Cancer Brother        throat cancer  . Diabetes Sister   . Heart disease Sister   . Cancer Brother        pancreatic  . Prostate cancer Brother   . Kidney disease Son   . Colon cancer Neg Hx     CURRENT MEDICATIONS:  Outpatient Encounter Medications as of 09/27/2018  Medication Sig  . Ipratropium-Albuterol (COMBIVENT RESPIMAT) 20-100 MCG/ACT AERS respimat Inhale 1 puff into the lungs every 6 (six) hours as needed for wheezing.  Marland Kitchen levothyroxine (SYNTHROID, LEVOTHROID) 137 MCG tablet Take 137 mcg by mouth daily before breakfast.   . LORazepam (ATIVAN) 1 MG tablet Take 1  tablet (1 mg total) by mouth 2 (two) times daily.  Marland Kitchen omeprazole (PRILOSEC) 20 MG capsule Take 20 mg by mouth daily.  . simvastatin (ZOCOR) 10 MG tablet Take 10 mg by mouth at bedtime.  . prochlorperazine (COMPAZINE) 10 MG tablet Take 1 tablet (10 mg total) by mouth every 6 (six) hours as needed (Nausea or vomiting). (Patient not taking: Reported on 09/27/2018)  . sertraline (ZOLOFT) 50 MG tablet Take 50 mg by mouth every morning.   . traMADol (ULTRAM) 50 MG tablet Take 1 tablet (50 mg total) by mouth every 6 (six) hours as needed. (Patient not taking: Reported on 09/27/2018)   No facility-administered encounter medications on file as of 09/27/2018.     ALLERGIES:  Allergies  Allergen Reactions  . Morphine And Related Shortness Of Breath and Other (See Comments)    Hallucinations pt has taken hydromorphone before     PHYSICAL EXAM:  ECOG Performance status: 1  Vitals:   09/27/18 1532  BP: 124/76  Pulse: 80  Resp: 16  Temp: 97.8 F (36.6 C)  SpO2: 98%   Filed Weights   09/27/18 1532  Weight: 151 lb 12.8 oz (68.9 kg)    Physical Exam Vitals signs reviewed.  Constitutional:      Appearance: Normal appearance.  Cardiovascular:     Rate and Rhythm: Normal rate and regular rhythm.     Heart sounds: Normal heart sounds.  Pulmonary:     Effort: Pulmonary effort is normal.     Breath sounds: Normal breath sounds.  Abdominal:     General: There is no distension.     Palpations: Abdomen is soft. There is no mass.  Musculoskeletal:        General: No swelling.  Skin:    General: Skin is warm.  Neurological:     General: No focal deficit present.     Mental Status: He is alert and oriented to person, place, and time.  Psychiatric:        Mood and Affect: Mood normal.        Behavior: Behavior normal.      LABORATORY DATA:  I have reviewed the labs as listed.  CBC    Component Value Date/Time   WBC 6.2 09/26/2018 0756  RBC 4.47 09/26/2018 0756   HGB 14.1  09/26/2018 0756   HCT 43.7 09/26/2018 0756   PLT 143 (L) 09/26/2018 0756   MCV 97.8 09/26/2018 0756   MCH 31.5 09/26/2018 0756   MCHC 32.3 09/26/2018 0756   RDW 13.3 09/26/2018 0756   LYMPHSABS 1.4 09/26/2018 0756   MONOABS 0.7 09/26/2018 0756   EOSABS 0.3 09/26/2018 0756   BASOSABS 0.1 09/26/2018 0756   CMP Latest Ref Rng & Units 09/26/2018 06/15/2018 05/04/2018  Glucose 70 - 99 mg/dL 115(H) 124(H) 109(H)  BUN 8 - 23 mg/dL _0 Creatinine 0.61 - 1.24 mg/dL 1.09 0.95 0.82  Sodium 135 - 145 mmol/L 140 139 139  Potassium 3.5 - 5.1 mmol/L 4.0 4.0 5.1  Chloride 98 - 111 mmol/L 102 102 103  CO2 22 - 32 mmol/L _1 Calcium 8.9 - 10.3 mg/dL 9.1 8.8(L) 8.8(L)  Total Protein 6.5 - 8.1 g/dL 7.7 8.0 7.3  Total Bilirubin 0.3 - 1.2 mg/dL 0.5 0.7 0.5  Alkaline Phos 38 - 126 U/L 70 78 81  AST 15 - 41 U/L _2 ALT 0 - 44 U/L _3 DIAGNOSTIC IMAGING:  I have independently reviewed the scans and discussed with the patient.   I have reviewed Venita Lick LPN's note and agree with the documentation.  I personally performed a face-to-face visit, made revisions and my assessment and plan is as follows.    ASSESSMENT & PLAN:   Small cell lung cancer ([place]) 1.  Limited stage small cell lung cancer: - Bronchoscopy and biopsy on 12/23/2017 shows small cell carcinoma.  Lymph node aspirations were also positive. - Chemoradiation therapy with carboplatin and etoposide 4 cycles from 01/18/2018 through 03/27/2018. - He was offered but declined PCI. - PET scan on 06/16/2018 showed further decrease in size and hypermetabolic about the mediastinal lymph nodes.  No progressive disease.  Right-sided areas of pulmonary hypermetabolism, interstitial thickening and mild airspace disease favoring infection/aspiration.  Resolution of L1 hypermetabolism without evidence of metastatic disease. - We reviewed results of the CT of CAP on 09/26/2018.in the periphery of the right lung, there is a  new pleural-based nodular opacity associated with the lateral aspect of the minor fissure measuring 1.2 x 1.8 cm. -I have reviewed his prior PET scan images.  This lesion was present at that time. - I have recommended a follow-up CT scan of the chest with contrast in 3 months. - He has stopped follow-up visits with Dr.Yanagihara.  He does not want to have an MRI of the brain as it hurts his eyes. -I have recommended doing a CT of the brain with and without contrast prior to next visit.  We will closely monitor his brain with scans as it is most likely site of metastatic disease.   Total time spent is 25 minutes with more than 50% of the time spent face-to-face discussing scan results, surveillance plan, counseling and coordination of care.  Orders placed this encounter:  Orders Placed This Encounter  Procedures  . CT Chest W Contrast  . CT Head W Wo Contrast  . CBC with Differential/Platelet  . Comprehensive metabolic panel      Derek Jack, MD [place] [phone]

## 2018-09-29 ENCOUNTER — Ambulatory Visit (HOSPITAL_COMMUNITY): Payer: Medicare Other | Admitting: Hematology

## 2018-09-29 DIAGNOSIS — I10 Essential (primary) hypertension: Secondary | ICD-10-CM | POA: Diagnosis not present

## 2018-09-29 DIAGNOSIS — E7800 Pure hypercholesterolemia, unspecified: Secondary | ICD-10-CM | POA: Diagnosis not present

## 2018-10-09 ENCOUNTER — Other Ambulatory Visit (HOSPITAL_COMMUNITY): Payer: Self-pay | Admitting: *Deleted

## 2018-10-09 DIAGNOSIS — C3490 Malignant neoplasm of unspecified part of unspecified bronchus or lung: Secondary | ICD-10-CM

## 2018-10-09 MED ORDER — LORAZEPAM 1 MG PO TABS: 1.0000 mg | ORAL_TABLET | Freq: Two times a day (BID) | ORAL | 0 refills | Status: DC

## 2018-11-07 ENCOUNTER — Other Ambulatory Visit (HOSPITAL_COMMUNITY): Payer: Self-pay | Admitting: *Deleted

## 2018-11-07 DIAGNOSIS — C3490 Malignant neoplasm of unspecified part of unspecified bronchus or lung: Secondary | ICD-10-CM

## 2018-11-07 MED ORDER — LORAZEPAM 1 MG PO TABS: 1.0000 mg | ORAL_TABLET | Freq: Two times a day (BID) | ORAL | 0 refills | Status: DC

## 2018-11-10 DIAGNOSIS — E039 Hypothyroidism, unspecified: Secondary | ICD-10-CM | POA: Diagnosis not present

## 2018-11-10 DIAGNOSIS — Z23 Encounter for immunization: Secondary | ICD-10-CM | POA: Diagnosis not present

## 2018-11-10 DIAGNOSIS — Z6822 Body mass index (BMI) 22.0-22.9, adult: Secondary | ICD-10-CM | POA: Diagnosis not present

## 2018-11-10 DIAGNOSIS — J431 Panlobular emphysema: Secondary | ICD-10-CM | POA: Diagnosis not present

## 2018-11-10 DIAGNOSIS — I7389 Other specified peripheral vascular diseases: Secondary | ICD-10-CM | POA: Diagnosis not present

## 2018-11-10 DIAGNOSIS — J449 Chronic obstructive pulmonary disease, unspecified: Secondary | ICD-10-CM | POA: Diagnosis not present

## 2018-11-10 DIAGNOSIS — I739 Peripheral vascular disease, unspecified: Secondary | ICD-10-CM | POA: Diagnosis not present

## 2018-11-29 DIAGNOSIS — E039 Hypothyroidism, unspecified: Secondary | ICD-10-CM | POA: Diagnosis not present

## 2018-11-29 DIAGNOSIS — J449 Chronic obstructive pulmonary disease, unspecified: Secondary | ICD-10-CM | POA: Diagnosis not present

## 2018-12-11 ENCOUNTER — Telehealth: Payer: Self-pay | Admitting: Gastroenterology

## 2018-12-11 ENCOUNTER — Other Ambulatory Visit (HOSPITAL_COMMUNITY): Payer: Self-pay | Admitting: *Deleted

## 2018-12-11 DIAGNOSIS — C3490 Malignant neoplasm of unspecified part of unspecified bronchus or lung: Secondary | ICD-10-CM

## 2018-12-11 MED ORDER — LORAZEPAM 1 MG PO TABS: 1.0000 mg | ORAL_TABLET | Freq: Two times a day (BID) | ORAL | 0 refills | Status: DC

## 2018-12-11 NOTE — Telephone Encounter (Signed)
Please let patient's daughter know that since his OV here for weight loss and N/V, anemia that I have been following his progress with oncology ie following labs, PET scan, weight etc.  It appears that he has gained some of his weight back. His anemia has resolved. If he is having any further issues with N/V or appetite, would offer a follow up visit.   No indication for colonoscopy.

## 2018-12-12 NOTE — Telephone Encounter (Signed)
Tried calling pt's daughter, VM is full. Will call pt back.

## 2018-12-14 NOTE — Telephone Encounter (Signed)
lmom for pt's daughter Santiago Glad, waiting on a return call.

## 2018-12-14 NOTE — Telephone Encounter (Signed)
Spoke with Santiago Glad and she is aware of recommendations as well.

## 2018-12-14 NOTE — Telephone Encounter (Signed)
Routing to Angie 

## 2018-12-14 NOTE — Telephone Encounter (Signed)
Spoke with pt and he says his appetite seems better.  He says that he is eating good.  He said that he seems to be doing good. Pt is aware that there is no indication for colonoscopy.  He is aware to contact us if he has any more issues with n/v, appetite, or if anything changes.  Pt voiced understanding.

## 2018-12-28 ENCOUNTER — Ambulatory Visit (HOSPITAL_COMMUNITY)
Admission: RE | Admit: 2018-12-28 | Discharge: 2018-12-28 | Disposition: A | Discharge status: Home / Self Care | Payer: Medicare Other | Source: Ambulatory Visit | Attending: Hematology | Admitting: Hematology

## 2018-12-28 ENCOUNTER — Inpatient Hospital Stay (HOSPITAL_COMMUNITY): Payer: Medicare Other | Attending: Hematology

## 2018-12-28 ENCOUNTER — Other Ambulatory Visit: Payer: Self-pay

## 2018-12-28 DIAGNOSIS — I672 Cerebral atherosclerosis: Secondary | ICD-10-CM | POA: Diagnosis not present

## 2018-12-28 DIAGNOSIS — C3431 Malignant neoplasm of lower lobe, right bronchus or lung: Secondary | ICD-10-CM | POA: Insufficient documentation

## 2018-12-28 DIAGNOSIS — J439 Emphysema, unspecified: Secondary | ICD-10-CM | POA: Insufficient documentation

## 2018-12-28 DIAGNOSIS — I700 Atherosclerosis of aorta: Secondary | ICD-10-CM | POA: Diagnosis not present

## 2018-12-28 DIAGNOSIS — C3490 Malignant neoplasm of unspecified part of unspecified bronchus or lung: Secondary | ICD-10-CM | POA: Insufficient documentation

## 2018-12-28 DIAGNOSIS — E279 Disorder of adrenal gland, unspecified: Secondary | ICD-10-CM | POA: Insufficient documentation

## 2018-12-28 LAB — COMPREHENSIVE METABOLIC PANEL
ALT: 14 U/L (ref 0–44)
AST: 17 U/L (ref 15–41)
Albumin: 4 g/dL (ref 3.5–5.0)
Alkaline Phosphatase: 74 U/L (ref 38–126)
Anion gap: 7 (ref 5–15)
BUN: 17 mg/dL (ref 8–23)
CO2: 27 mmol/L (ref 22–32)
Calcium: 8.6 mg/dL — ABNORMAL LOW (ref 8.9–10.3)
Chloride: 103 mmol/L (ref 98–111)
Creatinine, Ser: 1.05 mg/dL (ref 0.61–1.24)
GFR calc Af Amer: >60 mL/min (ref >60)
GFR calc non Af Amer: >60 mL/min (ref >60)
Glucose, Bld: 93 mg/dL (ref 70–99)
Potassium: 4.3 mmol/L (ref 3.5–5.1)
Sodium: 137 mmol/L (ref 135–145)
Total Bilirubin: 0.7 mg/dL (ref 0.3–1.2)
Total Protein: 7.6 g/dL (ref 6.5–8.1)

## 2018-12-28 LAB — CBC WITH DIFFERENTIAL/PLATELET
Abs Immature Granulocytes: 0.03 10*3/uL (ref 0.00–0.07)
Basophils Absolute: 0 10*3/uL (ref 0.0–0.1)
Basophils Relative: 1 %
Eosinophils Absolute: 0.2 10*3/uL (ref 0.0–0.5)
Eosinophils Relative: 3 %
HCT: 41.6 % (ref 39.0–52.0)
Hemoglobin: 13.6 g/dL (ref 13.0–17.0)
Immature Granulocytes: 0 %
Lymphocytes Relative: 17 %
Lymphs Abs: 1.3 10*3/uL (ref 0.7–4.0)
MCH: 31.9 pg (ref 26.0–34.0)
MCHC: 32.7 g/dL (ref 30.0–36.0)
MCV: 97.7 fL (ref 80.0–100.0)
Monocytes Absolute: 0.7 10*3/uL (ref 0.1–1.0)
Monocytes Relative: 10 %
Neutro Abs: 5.2 10*3/uL (ref 1.7–7.7)
Neutrophils Relative %: 69 %
Platelets: 160 10*3/uL (ref 150–400)
RBC: 4.26 MIL/uL (ref 4.22–5.81)
RDW: 13.7 % (ref 11.5–15.5)
WBC: 7.5 10*3/uL (ref 4.0–10.5)
nRBC: 0 % (ref 0.0–0.2)

## 2018-12-28 LAB — POCT I-STAT CREATININE: Creatinine, Ser: 1.1 mg/dL (ref 0.61–1.24)

## 2018-12-28 MED ORDER — IOHEXOL 300 MG/ML  SOLN
75.0000 mL | Freq: Once | INTRAMUSCULAR | Status: AC | PRN
  Administered 2018-12-28: 75 mL via INTRAVENOUS

## 2019-01-02 ENCOUNTER — Other Ambulatory Visit: Payer: Self-pay

## 2019-01-02 ENCOUNTER — Inpatient Hospital Stay (HOSPITAL_COMMUNITY): Payer: Medicare Other | Attending: Hematology | Admitting: Nurse Practitioner

## 2019-01-02 DIAGNOSIS — R911 Solitary pulmonary nodule: Secondary | ICD-10-CM | POA: Insufficient documentation

## 2019-01-02 DIAGNOSIS — C3490 Malignant neoplasm of unspecified part of unspecified bronchus or lung: Secondary | ICD-10-CM

## 2019-01-02 DIAGNOSIS — C3431 Malignant neoplasm of lower lobe, right bronchus or lung: Secondary | ICD-10-CM | POA: Insufficient documentation

## 2019-01-02 NOTE — Assessment & Plan Note (Addendum)
1.  Limited stage small cell lung cancer: -Bronchoscopy and biopsy on 12/23/2017 showed small cell carcinoma.  Lymph node aspirations were also positive. -Chemoradiation therapy with carboplatin and etoposide 4 cycles from 01/18/2018-03/27/2018. -She was offered but declined PCI. -PET scan on 06/16/2018 showed further decrease in size and hypermetabolic about then mediastinal lymph nodes.  No progressive disease.  Right-sided area of pulmonary hypermetabolic some, interstitial thickening and mild airspace disease favoring infection/aspiration.  Resolution of L1 hypermetabolic zone without evidence of metastatic disease. -CT CAP on 09/26/2018 showed in the peripheral of the right lung there is a new pleural-based nodular opacity associated with the lateral aspect of the minor fissure measuring 1.2 x 1.8 cm.  The previous PET was reviewed and the lesion was present at that time. -He has stopped follow-up visits with Dr. Francesca Jewett.  He does not want to have MRI of the brain as it hurts his eyes. -CT of his chest done on 12/28/2018 showed the peripheral ill-defined nodule opacity in the right lung is stable to minimally smaller.  Given the persistent over the 21-month interval recommended CT in 3 to 6 months to ensure stability. -CT of the brain with and without contrast done on 12/28/2018 showed no metastatic disease. -We will order a CT of the chest in 3 months and follow-up with labs.

## 2019-01-02 NOTE — Progress Notes (Signed)
[facility] [address] [place], [place] [address]   CLINIC:  Medical Oncology/Hematology  PCP:  Rory Percy, MD [place] [id number] [phone]   REASON FOR VISIT: Follow-up for small cell lung cancer  CURRENT THERAPY: Surveillance per NCCN guidelines  BRIEF ONCOLOGIC HISTORY:  Oncology History  Small cell lung cancer ([place])  12/30/2017 Initial Diagnosis   Small cell lung cancer ([place])   01/18/2018 -  Chemotherapy   The patient had palonosetron (ALOXI) injection 0.25 mg, 0.25 mg, Intravenous,  Once, 4 of 4 cycles Administration: 0.25 mg (01/18/2018), 0.25 mg (02/08/2018), 0.25 mg (03/06/2018), 0.25 mg (03/27/2018) CARBOplatin (PARAPLATIN) 300 mg in sodium chloride 0.9 % 250 mL chemo infusion, 300 mg (100 % of original dose 300 mg), Intravenous,  Once, 4 of 4 cycles Dose modification: 300 mg (original dose 300 mg, Cycle 1), 409.5 mg (original dose 409.5 mg, Cycle 2),   (original dose 409.5 mg, Cycle 3), 409.5 mg (original dose 409.5 mg, Cycle 4) Administration: 300 mg (01/18/2018), 410 mg (02/08/2018), 410 mg (03/06/2018), 410 mg (03/27/2018) etoposide (VEPESID) 160 mg in sodium chloride 0.9 % 500 mL chemo infusion, 90 mg/m2 = 160 mg (90 % of original dose 100 mg/m2), Intravenous,  Once, 4 of 4 cycles Dose modification: 90 mg/m2 (90 % of original dose 100 mg/m2, Cycle 1, Reason: Patient Age) Administration: 160 mg (01/18/2018), 170 mg (01/19/2018), 170 mg (02/10/2018), 170 mg (02/08/2018), 170 mg (02/09/2018), 170 mg (01/20/2018), 170 mg (03/07/2018), 170 mg (03/06/2018), 170 mg (03/08/2018), 170 mg (03/27/2018), 170 mg (03/28/2018), 170 mg (03/29/2018) fosaprepitant (EMEND) 150 mg, dexamethasone (DECADRON) 12 mg in sodium chloride 0.9 % 145 mL IVPB, , Intravenous,  Once, 4 of 4 cycles Administration:  (01/18/2018),  (02/08/2018),  (03/06/2018),  (03/27/2018)  for chemotherapy treatment.       INTERVAL HISTORY:  Terry Boyd 78 y.o. male returns for routine follow-up  for small cell lung cancer.  Patient report he has been doing well since his last visit.  He has no complaints at this time. Denies any nausea, vomiting, or diarrhea. Denies any new pains. Had not noticed any recent bleeding such as epistaxis, hematuria or hematochezia. Denies recent chest pain on exertion, shortness of breath on minimal exertion, pre-syncopal episodes, or palpitations. Denies any numbness or tingling in hands or feet. Denies any recent fevers, infections, or recent hospitalizations. Patient reports appetite at 50% and energy level at 25%.  He is eating well maintain his weight at this time.     REVIEW OF SYSTEMS:  Review of Systems  All other systems reviewed and are negative.    PAST MEDICAL/SURGICAL HISTORY:  Past Medical History:  Diagnosis Date  . AAA (abdominal aortic aneurysm) ([place])   . Alternating constipation and diarrhea   . Arthritis   . Chronic back pain   . COPD (chronic obstructive pulmonary disease) ([place])   . Depression   . GERD (gastroesophageal reflux disease)   . History of hiatal hernia   . Hypertension    not on medications  . Hypothyroidism   . Lung cancer ([place]) 2019  . Peripheral vascular disease ([place])   . Smoker   . Thyroid disease    Past Surgical History:  Procedure Laterality Date  . ABDOMINAL AORTAGRAM N/A 03/14/2012   Procedure: ABDOMINAL Maxcine Ham;  Surgeon: Serafina [place], MD;  Location: [facility] CATH LAB;  Service: Cardiovascular;  Laterality: N/A;  . ABDOMINAL AORTIC ANEURYSM REPAIR  04-22-2009   Stent graft repair of  AAA  . APPENDECTOMY    . BRONCHIAL BRUSHINGS Right 11/30/2017   Procedure: BRONCHIAL BRUSHINGS;  Surgeon: Sinda Du, MD;  Location: [facility];  Service: Cardiopulmonary;  Laterality: Right;  . BRONCHIAL WASHINGS Right 11/30/2017   Procedure: BRONCHIAL WASHINGS;  Surgeon: Sinda Du, MD;  [facility]: [facility];  Service: Cardiopulmonary;  Laterality: Right;  . BYPASS GRAFT POPLITEAL TO POPLITEAL  03/20/2012    Procedure: BYPASS GRAFT POPLITEAL TO POPLITEAL;  Surgeon: Elam Dutch, MD;  Location: [facility];  Service: Vascular;  Laterality: Left;  ABOVE THE KNEE POPLITEAL ARTERY TO BELOW THE KNEE POPLITEAL ARTERY BYPASS GRAFT USING REVERSE LEFT GREATER SAPHENOUS VEIN   . ENDARTERECTOMY POPLITEAL  03/20/2012   Procedure: ENDARTERECTOMY POPLITEAL;  Surgeon: Elam Dutch, MD;  Location: [facility] OR;  Service: Vascular;  Laterality: Left;   LIGATION OF LEFT LEG POPLITEAL ANEURYSM  . ESOPHAGOGASTRODUODENOSCOPY  09/28/2017   Dr. Lurlean Nanny: Medium sized hiatal hernia, residual food.  Suspect gastroparesis.  Gastric atony.  . ESOPHAGOGASTRODUODENOSCOPY  01/05/2018   [place].  Large amount of solid food upon entry to the stomach.  Pylorus appeared patent.  Normal duodenal mucosa without evidence of obstruction to the second portion of duodenum.  Balloon dilation performed of the pylorus.  Marland Kitchen FLEXIBLE BRONCHOSCOPY N/A 11/30/2017   Procedure: FLEXIBLE BRONCHOSCOPY;  Surgeon: Sinda Du, MD;  [facility];  Service: Cardiopulmonary;  Laterality: N/A;  . INTRAOPERATIVE ARTERIOGRAM  03/20/2012   Procedure: INTRA OPERATIVE ARTERIOGRAM;  Surgeon: Elam Dutch, MD;  Location: [facility];  Service: Vascular;  Laterality: Left;  TIMES ONE  . PORTACATH PLACEMENT Left 01/06/2018   Procedure: INSERTION PORT-A-CATH;  Surgeon: Aviva Signs, MD;  Location: [facility] ORS;  Service: General;  Laterality: Left;  Marland Kitchen VASECTOMY  1982  . VIDEO BRONCHOSCOPY WITH ENDOBRONCHIAL ULTRASOUND N/A 12/23/2017   Procedure: VIDEO BRONCHOSCOPY WITH ENDOBRONCHIAL ULTRASOUND;  Surgeon: Melrose Nakayama, MD;  Location: [facility];  Service: Thoracic;  Laterality: N/A;     SOCIAL HISTORY:  Social History   Socioeconomic History  . Marital status: Divorced    Spouse name: Not on file  . Number of children: 2  . Years of education: Not on file  . Highest education level: Not on file  Occupational History  . Occupation: [place]      Comment: Agent Orange exposure  . Occupation: Sales executive    Comment: [place] exsposure  Social Needs  . Financial resource strain: Not hard at all  . Food insecurity    Worry: Never true    Inability: Never true  . Transportation needs    Medical: No    Non-medical: No  Tobacco Use  . Smoking status: Current Every Day Smoker    Years: 62.00    Types: Cigars  . Smokeless tobacco: Never Used  . Tobacco comment: pt states he smokes about 15 cigars per day  Substance and Sexual Activity  . Alcohol use: No  . Drug use: No  . Sexual activity: Yes    Birth control/protection: None  Lifestyle  . Physical activity    Days per week: 0 days    Minutes per session: 0 min  . Stress: To some extent  Relationships  . Social connections    Talks on phone: More than three times a week    Gets together: Once a week    Attends religious service: More than 4 times per year    Active member of club or organization: No  Attends meetings of clubs or organizations: Never    Relationship status: Divorced  . Intimate partner violence    Fear of current or ex partner: No    Emotionally abused: No    Physically abused: No    Forced sexual activity: No  Other Topics Concern  . Not on file  Social History Narrative   Sedentary    FAMILY HISTORY:  Family History  Problem Relation Age of Onset  . Diabetes Mother   . Stroke Mother   . Heart disease Father   . Diabetes Father   . Heart disease Sister        CABG x 4  . Cancer Brother        throat cancer  . Diabetes Sister   . Heart disease Sister   . Cancer Brother        pancreatic  . Prostate cancer Brother   . Kidney disease Son   . Colon cancer Neg Hx     CURRENT MEDICATIONS:  Outpatient Encounter Medications as of 01/02/2019  Medication Sig  . levothyroxine (SYNTHROID, LEVOTHROID) 137 MCG tablet Take 137 mcg by mouth daily before breakfast.   . LORazepam (ATIVAN) 1 MG tablet Take 1 tablet (1 mg total) by mouth 2  (two) times daily.  . Multiple Vitamins-Minerals (CENTRUM SILVER 50+MEN) TABS Take by mouth once.  . sertraline (ZOLOFT) 50 MG tablet Take 50 mg by mouth daily.  . simvastatin (ZOCOR) 10 MG tablet Take 10 mg by mouth at bedtime.  . Ipratropium-Albuterol (COMBIVENT RESPIMAT) 20-100 MCG/ACT AERS respimat Inhale 1 puff into the lungs every 6 (six) hours as needed for wheezing.  . [DISCONTINUED] omeprazole (PRILOSEC) 20 MG capsule Take 20 mg by mouth daily.  . [DISCONTINUED] prochlorperazine (COMPAZINE) 10 MG tablet Take 1 tablet (10 mg total) by mouth every 6 (six) hours as needed (Nausea or vomiting). (Patient not taking: Reported on 09/27/2018)  . [DISCONTINUED] sertraline (ZOLOFT) 50 MG tablet Take 50 mg by mouth every morning.   . [DISCONTINUED] traMADol (ULTRAM) 50 MG tablet Take 1 tablet (50 mg total) by mouth every 6 (six) hours as needed. (Patient not taking: Reported on 09/27/2018)   No facility-administered encounter medications on file as of 01/02/2019.     ALLERGIES:  Allergies  Allergen Reactions  . Morphine And Related Shortness Of Breath and Other (See Comments)    Hallucinations pt has taken hydromorphone before     PHYSICAL EXAM:  ECOG Performance status: 1   Vitals:   01/02/19 1456  BP: (!) 152/86  Pulse: 75  Resp: 18  Temp: (!) 97.1 F (36.2 C)  SpO2: 98%   Filed Weights   01/02/19 1456  Weight: 158 lb 4.8 oz (71.8 kg)    Physical Exam Constitutional:      Appearance: Normal appearance. He is normal weight.  Cardiovascular:     Rate and Rhythm: Normal rate and regular rhythm.     Heart sounds: Normal heart sounds.  Pulmonary:     Effort: Pulmonary effort is normal.     Breath sounds: Normal breath sounds.  Abdominal:     General: Bowel sounds are normal.     Palpations: Abdomen is soft.  Musculoskeletal: Normal range of motion.  Skin:    General: Skin is warm.  Neurological:     Mental Status: He is alert and oriented to person, place, and time.  Mental status is at baseline.  Psychiatric:        Mood and Affect: Mood normal.  Behavior: Behavior normal.        Thought Content: Thought content normal.        Judgment: Judgment normal.      LABORATORY DATA:  I have reviewed the labs as listed.  CBC    Component Value Date/Time   WBC 7.5 12/28/2018 1542   RBC 4.26 12/28/2018 1542   HGB 13.6 12/28/2018 1542   HCT 41.6 12/28/2018 1542   PLT 160 12/28/2018 1542   MCV 97.7 12/28/2018 1542   MCH 31.9 12/28/2018 1542   MCHC 32.7 12/28/2018 1542   RDW 13.7 12/28/2018 1542   LYMPHSABS 1.3 12/28/2018 1542   MONOABS 0.7 12/28/2018 1542   EOSABS 0.2 12/28/2018 1542   BASOSABS 0.0 12/28/2018 1542   CMP Latest Ref Rng & Units 12/28/2018 12/28/2018 09/26/2018  Glucose 70 - 99 mg/dL 93 - 115(H)  BUN 8 - 23 mg/dL 17 - 17  Creatinine 0.61 - 1.24 mg/dL 1.05 1.10 1.09  Sodium 135 - 145 mmol/L 137 - 140  Potassium 3.5 - 5.1 mmol/L 4.3 - 4.0  Chloride 98 - 111 mmol/L 103 - 102  CO2 22 - 32 mmol/L 27 - 29  Calcium 8.9 - 10.3 mg/dL 8.6(L) - 9.1  Total Protein 6.5 - 8.1 g/dL 7.6 - 7.7  Total Bilirubin 0.3 - 1.2 mg/dL 0.7 - 0.5  Alkaline Phos 38 - 126 U/L 74 - 70  AST 15 - 41 U/L 17 - 17  ALT 0 - 44 U/L 14 - 16       DIAGNOSTIC IMAGING:  I have independently reviewed the CT scans and discussed with the patient.   I have reviewed Francene Finders, NP's note and agree with the documentation.  I personally performed a face-to-face visit, made revisions and my assessment and plan is as follows.    ASSESSMENT & PLAN:   Small cell lung cancer ([place]) 1.  Limited stage small cell lung cancer: -Bronchoscopy and biopsy on 12/23/2017 showed small cell carcinoma.  Lymph node aspirations were also positive. -Chemoradiation therapy with carboplatin and etoposide 4 cycles from 01/18/2018-03/27/2018. -She was offered but declined PCI. -PET scan on 06/16/2018 showed further decrease in size and hypermetabolic about then mediastinal lymph  nodes.  No progressive disease.  Right-sided area of pulmonary hypermetabolic some, interstitial thickening and mild airspace disease favoring infection/aspiration.  Resolution of L1 hypermetabolic zone without evidence of metastatic disease. -CT CAP on 09/26/2018 showed in the peripheral of the right lung there is a new pleural-based nodular opacity associated with the lateral aspect of the minor fissure measuring 1.2 x 1.8 cm.  The previous PET was reviewed and the lesion was present at that time. -He has stopped follow-up visits with Dr. Francesca Jewett.  He does not want to have MRI of the brain as it hurts his eyes. -CT of his chest done on 12/28/2018 showed the peripheral ill-defined nodule opacity in the right lung is stable to minimally smaller.  Given the persistent over the 9-monthinterval recommended CT in 3 to 6 months to ensure stability. -CT of the brain with and without contrast done on 12/28/2018 showed no metastatic disease. -We will order a CT of the chest in 3 months and follow-up with labs.      Orders placed this encounter:  Orders Placed This Encounter  Procedures  . CT CHEST W CONTRAST  . Lactate dehydrogenase  . CBC with Differential/Platelet  . Comprehensive metabolic panel      RFrancene Finders FNP-C [phone]

## 2019-01-02 NOTE — Patient Instructions (Signed)
[place] at [facility] Discharge Instructions  Follow up in 3 months with ct scan and labs   Thank you for choosing [place] at [facility] to provide your oncology and hematology care.  To afford each patient quality time with our provider, please arrive at least 15 minutes before your scheduled appointment time.   If you have a lab appointment with the [facility] please come in thru the Main Entrance and check in at the main information desk.  You need to re-schedule your appointment should you arrive 10 or more minutes late.  We strive to give you quality time with our providers, and arriving late affects you and other patients whose appointments are after yours.  Also, if you no show three or more times for appointments you may be dismissed from the clinic at the providers discretion.     Again, thank you for choosing [facility].  Our hope is that these requests will decrease the amount of time that you wait before being seen by our physicians.       _____________________________________________________________  Should you have questions after your visit to [facility], please contact our office at [phone] between the hours of 8:00 a.m. and 4:30 p.m.  Voicemails left after 4:00 p.m. will not be returned until the following business day.  For prescription refill requests, have your pharmacy contact our office and allow 72 hours.    Due to Covid, you will need to wear a mask upon entering the hospital. If you do not have a mask, a mask will be given to you at the Main Entrance upon arrival. For doctor visits, patients may have 1 support person with them. For treatment visits, patients can not have anyone with them due to social distancing guidelines and our immunocompromised population.

## 2019-01-08 ENCOUNTER — Other Ambulatory Visit (HOSPITAL_COMMUNITY): Payer: Self-pay | Admitting: Nurse Practitioner

## 2019-01-08 DIAGNOSIS — C3490 Malignant neoplasm of unspecified part of unspecified bronchus or lung: Secondary | ICD-10-CM

## 2019-01-08 MED ORDER — LORAZEPAM 1 MG PO TABS: 1.0000 mg | ORAL_TABLET | Freq: Two times a day (BID) | ORAL | 0 refills | Status: DC

## 2019-02-07 ENCOUNTER — Other Ambulatory Visit (HOSPITAL_COMMUNITY): Payer: Self-pay | Admitting: *Deleted

## 2019-02-07 DIAGNOSIS — C3490 Malignant neoplasm of unspecified part of unspecified bronchus or lung: Secondary | ICD-10-CM

## 2019-02-07 MED ORDER — LORAZEPAM 1 MG PO TABS: 1.0000 mg | ORAL_TABLET | Freq: Two times a day (BID) | ORAL | 0 refills | Status: DC

## 2019-03-01 DIAGNOSIS — E782 Mixed hyperlipidemia: Secondary | ICD-10-CM | POA: Diagnosis not present

## 2019-03-01 DIAGNOSIS — I10 Essential (primary) hypertension: Secondary | ICD-10-CM | POA: Diagnosis not present

## 2019-03-08 ENCOUNTER — Other Ambulatory Visit (HOSPITAL_COMMUNITY): Payer: Self-pay | Admitting: *Deleted

## 2019-03-08 DIAGNOSIS — C3490 Malignant neoplasm of unspecified part of unspecified bronchus or lung: Secondary | ICD-10-CM

## 2019-03-08 MED ORDER — LORAZEPAM 1 MG PO TABS: 1.0000 mg | ORAL_TABLET | Freq: Two times a day (BID) | ORAL | 0 refills | Status: DC

## 2019-04-06 ENCOUNTER — Ambulatory Visit (HOSPITAL_COMMUNITY)
Admission: RE | Admit: 2019-04-06 | Discharge: 2019-04-06 | Disposition: A | Discharge status: Home / Self Care | Payer: Medicare Other | Source: Ambulatory Visit | Attending: Nurse Practitioner | Admitting: Nurse Practitioner

## 2019-04-06 ENCOUNTER — Other Ambulatory Visit (HOSPITAL_COMMUNITY): Payer: Self-pay | Admitting: *Deleted

## 2019-04-06 ENCOUNTER — Inpatient Hospital Stay (HOSPITAL_COMMUNITY): Payer: Medicare Other | Attending: Hematology

## 2019-04-06 ENCOUNTER — Other Ambulatory Visit: Payer: Self-pay

## 2019-04-06 DIAGNOSIS — C3490 Malignant neoplasm of unspecified part of unspecified bronchus or lung: Secondary | ICD-10-CM | POA: Insufficient documentation

## 2019-04-06 DIAGNOSIS — F17210 Nicotine dependence, cigarettes, uncomplicated: Secondary | ICD-10-CM | POA: Insufficient documentation

## 2019-04-06 DIAGNOSIS — G8929 Other chronic pain: Secondary | ICD-10-CM | POA: Diagnosis not present

## 2019-04-06 DIAGNOSIS — R05 Cough: Secondary | ICD-10-CM | POA: Insufficient documentation

## 2019-04-06 DIAGNOSIS — M545 Low back pain: Secondary | ICD-10-CM | POA: Insufficient documentation

## 2019-04-06 LAB — CBC WITH DIFFERENTIAL/PLATELET
Abs Immature Granulocytes: 0.03 10*3/uL (ref 0.00–0.07)
Basophils Absolute: 0.1 10*3/uL (ref 0.0–0.1)
Basophils Relative: 1 %
Eosinophils Absolute: 0.2 10*3/uL (ref 0.0–0.5)
Eosinophils Relative: 3 %
HCT: 46.9 % (ref 39.0–52.0)
Hemoglobin: 15.2 g/dL (ref 13.0–17.0)
Immature Granulocytes: 0 %
Lymphocytes Relative: 21 %
Lymphs Abs: 1.5 10*3/uL (ref 0.7–4.0)
MCH: 31.1 pg (ref 26.0–34.0)
MCHC: 32.4 g/dL (ref 30.0–36.0)
MCV: 95.9 fL (ref 80.0–100.0)
Monocytes Absolute: 0.6 10*3/uL (ref 0.1–1.0)
Monocytes Relative: 9 %
Neutro Abs: 4.6 10*3/uL (ref 1.7–7.7)
Neutrophils Relative %: 66 %
Platelets: 153 10*3/uL (ref 150–400)
RBC: 4.89 MIL/uL (ref 4.22–5.81)
RDW: 13.3 % (ref 11.5–15.5)
WBC: 7.1 10*3/uL (ref 4.0–10.5)
nRBC: 0 % (ref 0.0–0.2)

## 2019-04-06 LAB — COMPREHENSIVE METABOLIC PANEL
ALT: 14 U/L (ref 0–44)
AST: 14 U/L — ABNORMAL LOW (ref 15–41)
Albumin: 4.2 g/dL (ref 3.5–5.0)
Alkaline Phosphatase: 77 U/L (ref 38–126)
Anion gap: 9 (ref 5–15)
BUN: 19 mg/dL (ref 8–23)
CO2: 28 mmol/L (ref 22–32)
Calcium: 9 mg/dL (ref 8.9–10.3)
Chloride: 101 mmol/L (ref 98–111)
Creatinine, Ser: 1.13 mg/dL (ref 0.61–1.24)
GFR calc Af Amer: >60 mL/min (ref >60)
GFR calc non Af Amer: >60 mL/min (ref >60)
Glucose, Bld: 95 mg/dL (ref 70–99)
Potassium: 4.5 mmol/L (ref 3.5–5.1)
Sodium: 138 mmol/L (ref 135–145)
Total Bilirubin: 0.5 mg/dL (ref 0.3–1.2)
Total Protein: 7.8 g/dL (ref 6.5–8.1)

## 2019-04-06 LAB — LACTATE DEHYDROGENASE: LDH: 123 U/L (ref 98–192)

## 2019-04-06 MED ORDER — LORAZEPAM 1 MG PO TABS: 1.0000 mg | ORAL_TABLET | Freq: Two times a day (BID) | ORAL | 0 refills | Status: DC

## 2019-04-06 MED ORDER — IOHEXOL 300 MG/ML  SOLN
75.0000 mL | Freq: Once | INTRAMUSCULAR | Status: AC | PRN
  Administered 2019-04-06: 75 mL via INTRAVENOUS

## 2019-04-10 ENCOUNTER — Other Ambulatory Visit: Payer: Self-pay

## 2019-04-10 ENCOUNTER — Encounter (HOSPITAL_COMMUNITY): Payer: Self-pay | Admitting: Hematology

## 2019-04-10 ENCOUNTER — Inpatient Hospital Stay (HOSPITAL_BASED_OUTPATIENT_CLINIC_OR_DEPARTMENT_OTHER): Payer: Medicare Other | Admitting: Hematology

## 2019-04-10 VITALS — BP 127/90 | HR 78 | Temp 97.1°F | Resp 17 | Wt 172.3 lb

## 2019-04-10 DIAGNOSIS — M545 Low back pain: Secondary | ICD-10-CM | POA: Diagnosis not present

## 2019-04-10 DIAGNOSIS — C3490 Malignant neoplasm of unspecified part of unspecified bronchus or lung: Secondary | ICD-10-CM

## 2019-04-10 DIAGNOSIS — R05 Cough: Secondary | ICD-10-CM | POA: Diagnosis not present

## 2019-04-10 DIAGNOSIS — G8929 Other chronic pain: Secondary | ICD-10-CM | POA: Diagnosis not present

## 2019-04-10 NOTE — Progress Notes (Signed)
[place], [place] [address]   CLINIC:  Medical Oncology/Hematology  PCP:  Rory Percy, MD [place] [address] [phone]   REASON FOR VISIT:  Follow-up for limited stage small cell lung  CURRENT THERAPY: observation   BRIEF ONCOLOGIC HISTORY:  Oncology History  Small cell lung cancer ([place])  12/30/2017 Initial Diagnosis   Small cell lung cancer ([place])   01/18/2018 -  Chemotherapy   The patient had palonosetron (ALOXI) injection 0.25 mg, 0.25 mg, Intravenous,  Once, 4 of 4 cycles Administration: 0.25 mg (01/18/2018), 0.25 mg (02/08/2018), 0.25 mg (03/06/2018), 0.25 mg (03/27/2018) CARBOplatin (PARAPLATIN) 300 mg in sodium chloride 0.9 % 250 mL chemo infusion, 300 mg (100 % of original dose 300 mg), Intravenous,  Once, 4 of 4 cycles Dose modification: 300 mg (original dose 300 mg, Cycle 1), 409.5 mg (original dose 409.5 mg, Cycle 2),   (original dose 409.5 mg, Cycle 3), 409.5 mg (original dose 409.5 mg, Cycle 4) Administration: 300 mg (01/18/2018), 410 mg (02/08/2018), 410 mg (03/06/2018), 410 mg (03/27/2018) etoposide (VEPESID) 160 mg in sodium chloride 0.9 % 500 mL chemo infusion, 90 mg/m2 = 160 mg (90 % of original dose 100 mg/m2), Intravenous,  Once, 4 of 4 cycles Dose modification: 90 mg/m2 (90 % of original dose 100 mg/m2, Cycle 1, Reason: Patient Age) Administration: 160 mg (01/18/2018), 170 mg (01/19/2018), 170 mg (02/10/2018), 170 mg (02/08/2018), 170 mg (02/09/2018), 170 mg (01/20/2018), 170 mg (03/07/2018), 170 mg (03/06/2018), 170 mg (03/08/2018), 170 mg (03/27/2018), 170 mg (03/28/2018), 170 mg (03/29/2018) fosaprepitant (EMEND) 150 mg, dexamethasone (DECADRON) 12 mg in sodium chloride 0.9 % 145 mL IVPB, , Intravenous,  Once, 4 of 4 cycles Administration:  (01/18/2018),  (02/08/2018),  (03/06/2018),  (03/27/2018)  for chemotherapy treatment.       CANCER STAGING: Cancer Staging No matching staging information was found for the  patient.   INTERVAL HISTORY:  Terry Boyd 79 y.o. male seen for follow-up of small cell lung cancer.  Denies any change in baseline cough or hemoptysis.  He continues to smoke up to 1 pack of cigarettes per day.  Denies any chest pains or lightheadedness.  Appetite is 25%.  Energy levels are 50%.  Chronic low back pain is rated as 8 out of 10.   REVIEW OF SYSTEMS:  Review of Systems  Respiratory: Positive for cough.   Musculoskeletal: Positive for back pain.  All other systems reviewed and are negative.    PAST MEDICAL/SURGICAL HISTORY:  Past Medical History:  Diagnosis Date  . AAA (abdominal aortic aneurysm) ([place])   . Alternating constipation and diarrhea   . Arthritis   . Chronic back pain   . COPD (chronic obstructive pulmonary disease) ([place])   . Depression   . GERD (gastroesophageal reflux disease)   . History of hiatal hernia   . Hypertension    not on medications  . Hypothyroidism   . Lung cancer ([place]) 2019  . Peripheral vascular disease ([place])   . Smoker   . Thyroid disease    Past Surgical History:  Procedure Laterality Date  . ABDOMINAL AORTAGRAM N/A 03/14/2012   Procedure: ABDOMINAL Maxcine Ham;  Surgeon: Serafina Mitchell, MD;  Location: [facility] CATH LAB;  Service: Cardiovascular;  Laterality: N/A;  . ABDOMINAL AORTIC ANEURYSM REPAIR  04-22-2009   Stent graft repair of AAA  . APPENDECTOMY    . BRONCHIAL BRUSHINGS Right 11/30/2017   Procedure: BRONCHIAL BRUSHINGS;  Surgeon: Sinda Du, MD;  [facility] END[facility];  Service: Cardiopulmonary;  Laterality: Right;  . BRONCHIAL WASHINGS Right 11/30/2017   Procedure: BRONCHIAL WASHINGS;  Surgeon: Sinda Du, MD;  [facility];  Service: Cardiopulmonary;  Laterality: Right;  . BYPASS GRAFT POPLITEAL TO POPLITEAL  03/20/2012   Procedure: BYPASS GRAFT POPLITEAL TO POPLITEAL;  Surgeon: Elam Dutch, MD;  Location: [facility];  Service: Vascular;  Laterality: Left;  ABOVE THE KNEE POPLITEAL ARTERY TO BELOW THE KNEE  POPLITEAL ARTERY BYPASS GRAFT USING REVERSE LEFT GREATER SAPHENOUS VEIN   . ENDARTERECTOMY POPLITEAL  03/20/2012   Procedure: ENDARTERECTOMY POPLITEAL;  Surgeon: Elam Dutch, MD;  Location: [facility] OR;  Service: Vascular;  Laterality: Left;   LIGATION OF LEFT LEG POPLITEAL ANEURYSM  . ESOPHAGOGASTRODUODENOSCOPY  09/28/2017   Dr. Lurlean Nanny: Medium sized hiatal hernia, residual food.  Suspect gastroparesis.  Gastric atony.  . ESOPHAGOGASTRODUODENOSCOPY  01/05/2018   [place].  Large amount of solid food upon entry to the stomach.  Pylorus appeared patent.  Normal duodenal mucosa without evidence of obstruction to the second portion of duodenum.  Balloon dilation performed of the pylorus.  Marland Kitchen FLEXIBLE BRONCHOSCOPY N/A 11/30/2017   Procedure: FLEXIBLE BRONCHOSCOPY;  Surgeon: Sinda Du, MD;  [facility];  Service: Cardiopulmonary;  Laterality: N/A;  . INTRAOPERATIVE ARTERIOGRAM  03/20/2012   Procedure: INTRA OPERATIVE ARTERIOGRAM;  Surgeon: Elam Dutch, MD;  Location: [facility];  Service: Vascular;  Laterality: Left;  TIMES ONE  . PORTACATH PLACEMENT Left 01/06/2018   Procedure: INSERTION PORT-A-CATH;  Surgeon: Aviva Signs, MD;  Location: [facility] ORS;  Service: General;  Laterality: Left;  Marland Kitchen VASECTOMY  1982  . VIDEO BRONCHOSCOPY WITH ENDOBRONCHIAL ULTRASOUND N/A 12/23/2017   Procedure: VIDEO BRONCHOSCOPY WITH ENDOBRONCHIAL ULTRASOUND;  Surgeon: Melrose Nakayama, MD;  Location: [facility];  Service: Thoracic;  Laterality: N/A;     SOCIAL HISTORY:  Social History   Socioeconomic History  . Marital status: Divorced    Spouse name: Not on file  . Number of children: 2  . Years of education: Not on file  . Highest education level: Not on file  Occupational History  . Occupation: [place]     Comment: Agent Orange exposure  . Occupation: Sales executive    Comment: [place] exsposure  Tobacco Use  . Smoking status: Current Every Day Smoker    Years: 62.00    Types:  Cigars  . Smokeless tobacco: Never Used  . Tobacco comment: pt states he smokes about 15 cigars per day  Substance and Sexual Activity  . Alcohol use: No  . Drug use: No  . Sexual activity: Yes    Birth control/protection: None  Other Topics Concern  . Not on file  Social History Narrative   Sedentary   Social Determinants of Health   Financial Resource Strain:   . Difficulty of Paying Living Expenses: Not on file  Food Insecurity:   . Worried About Charity fundraiser in the Last Year: Not on file  . Ran Out of Food in the Last Year: Not on file  Transportation Needs:   . Lack of Transportation (Medical): Not on file  . Lack of Transportation (Non-Medical): Not on file  Physical Activity:   . Days of Exercise per Week: Not on file  . Minutes of Exercise per Session: Not on file  Stress:   . Feeling of Stress : Not on file  Social Connections:   . Frequency of Communication with Friends and Family: Not on  file  . Frequency of Social Gatherings with Friends and Family: Not on file  . Attends Religious Services: Not on file  . Active Member of Clubs or Organizations: Not on file  . Attends Archivist Meetings: Not on file  . Marital Status: Not on file  Intimate Partner Violence:   . Fear of Current or Ex-Partner: Not on file  . Emotionally Abused: Not on file  . Physically Abused: Not on file  . Sexually Abused: Not on file    FAMILY HISTORY:  Family History  Problem Relation Age of Onset  . Diabetes Mother   . Stroke Mother   . Heart disease Father   . Diabetes Father   . Heart disease Sister        CABG x 4  . Cancer Brother        throat cancer  . Diabetes Sister   . Heart disease Sister   . Cancer Brother        pancreatic  . Prostate cancer Brother   . Kidney disease Son   . Colon cancer Neg Hx     CURRENT MEDICATIONS:  Outpatient Encounter Medications as of 04/10/2019  Medication Sig  . levothyroxine (SYNTHROID, LEVOTHROID) 137 MCG tablet  Take 137 mcg by mouth daily before breakfast.   . LORazepam (ATIVAN) 1 MG tablet Take 1 tablet (1 mg total) by mouth 2 (two) times daily.  . Multiple Vitamins-Minerals (CENTRUM SILVER 50+MEN) TABS Take by mouth once.  . sertraline (ZOLOFT) 50 MG tablet Take 50 mg by mouth daily.  . simvastatin (ZOCOR) 10 MG tablet Take 10 mg by mouth at bedtime.  . Ipratropium-Albuterol (COMBIVENT RESPIMAT) 20-100 MCG/ACT AERS respimat Inhale 1 puff into the lungs every 6 (six) hours as needed for wheezing.   No facility-administered encounter medications on file as of 04/10/2019.    ALLERGIES:  Allergies  Allergen Reactions  . Morphine And Related Shortness Of Breath and Other (See Comments)    Hallucinations pt has taken hydromorphone before     PHYSICAL EXAM:  ECOG Performance status: 1  Vitals:   04/10/19 1433  BP: 127/90  Pulse: 78  Resp: 17  Temp: (!) 97.1 F (36.2 C)  SpO2: 99%   Filed Weights   04/10/19 1433  Weight: 172 lb 4.8 oz (78.2 kg)    Physical Exam Vitals reviewed.  Constitutional:      Appearance: Normal appearance.  Cardiovascular:     Rate and Rhythm: Normal rate and regular rhythm.     Heart sounds: Normal heart sounds.  Pulmonary:     Effort: Pulmonary effort is normal.     Breath sounds: Normal breath sounds.  Abdominal:     General: There is no distension.     Palpations: Abdomen is soft. There is no mass.  Musculoskeletal:        General: No swelling.  Skin:    General: Skin is warm.  Neurological:     General: No focal deficit present.     Mental Status: He is alert and oriented to person, place, and time.  Psychiatric:        Mood and Affect: Mood normal.        Behavior: Behavior normal.      LABORATORY DATA:  I have reviewed the labs as listed.  CBC    Component Value Date/Time   WBC 7.1 04/06/2019 1238   RBC 4.89 04/06/2019 1238   HGB 15.2 04/06/2019 1238   HCT 46.9 04/06/2019 1238  PLT 153 04/06/2019 1238   MCV 95.9 04/06/2019 1238    MCH 31.1 04/06/2019 1238   MCHC 32.4 04/06/2019 1238   RDW 13.3 04/06/2019 1238   LYMPHSABS 1.5 04/06/2019 1238   MONOABS 0.6 04/06/2019 1238   EOSABS 0.2 04/06/2019 1238   BASOSABS 0.1 04/06/2019 1238   CMP Latest Ref Rng & Units 04/06/2019 12/28/2018 12/28/2018  Glucose 70 - 99 mg/dL 95 93 -  BUN 8 - 23 mg/dL 19 17 -  Creatinine 0.61 - 1.24 mg/dL 1.13 1.05 1.10  Sodium 135 - 145 mmol/L 138 137 -  Potassium 3.5 - 5.1 mmol/L 4.5 4.3 -  Chloride 98 - 111 mmol/L 101 103 -  CO2 22 - 32 mmol/L 28 27 -  Calcium 8.9 - 10.3 mg/dL 9.0 8.6(L) -  Total Protein 6.5 - 8.1 g/dL 7.8 7.6 -  Total Bilirubin 0.3 - 1.2 mg/dL 0.5 0.7 -  Alkaline Phos 38 - 126 U/L 77 74 -  AST 15 - 41 U/L 14(L) 17 -  ALT 0 - 44 U/L 14 14 -       DIAGNOSTIC IMAGING:  I have independently reviewed the scans and discussed with the patient.   I have reviewed Venita Lick LPN's note and agree with the documentation.  I personally performed a face-to-face visit, made revisions and my assessment and plan is as follows.    ASSESSMENT & PLAN:   Small cell lung cancer ([place]) 1.  Limited stage small cell lung cancer: -Chemoradiation therapy with carboplatin and etoposide 4 cycles from 01/18/2018 through 03/27/2018. -PCI was declined. -He is continuing to smoke about 1 pack of cigarettes per day. -He had a CT of the brain about 3 months ago which was normal. -We reviewed his labs including CBC and LFTs which were normal. -We reviewed CT chest with contrast from 04/06/2019 which showed continued interval decrease in conspicuity of his subpleural nodular opacity of the peripheral right upper lobe abutting the minor fissure which is unchanged in size compared to most recent exam.  No evidence of mass or lymphadenopathy in the chest.  Mild subpleural irregular interstitial opacity unchanged. -I have recommended follow-up CT scan of the chest and head in 3 months.    Orders placed this encounter:  Orders Placed This  Encounter  Procedures  . CT Chest W Contrast  . CT Head W Wo Contrast  . CBC with Differential/Platelet  . Comprehensive metabolic panel      Terry Jack, MD [place] [phone]

## 2019-04-10 NOTE — Assessment & Plan Note (Signed)
1.  Limited stage small cell lung cancer: -Chemoradiation therapy with carboplatin and etoposide 4 cycles from 01/18/2018 through 03/27/2018. -PCI was declined. -He is continuing to smoke about 1 pack of cigarettes per day. -He had a CT of the brain about 3 months ago which was normal. -We reviewed his labs including CBC and LFTs which were normal. -We reviewed CT chest with contrast from 04/06/2019 which showed continued interval decrease in conspicuity of his subpleural nodular opacity of the peripheral right upper lobe abutting the minor fissure which is unchanged in size compared to most recent exam.  No evidence of mass or lymphadenopathy in the chest.  Mild subpleural irregular interstitial opacity unchanged. -I have recommended follow-up CT scan of the chest and head in 3 months.

## 2019-04-10 NOTE — Patient Instructions (Addendum)
[place] at [facility] Discharge Instructions  You were seen today by Dr. Delton Coombes. He went over your recent lab and scan results, everything looks good. He will repeat scans prior to your next visit. He will see you back in 3 months for labs and follow up.   Thank you for choosing [place] at [facility] to provide your oncology and hematology care.  To afford each patient quality time with our provider, please arrive at least 15 minutes before your scheduled appointment time.   If you have a lab appointment with the [facility] please come in thru the  Main Entrance and check in at the main information desk  You need to re-schedule your appointment should you arrive 10 or more minutes late.  We strive to give you quality time with our providers, and arriving late affects you and other patients whose appointments are after yours.  Also, if you no show three or more times for appointments you may be dismissed from the clinic at the providers discretion.     Again, thank you for choosing [facility].  Our hope is that these requests will decrease the amount of time that you wait before being seen by our physicians.       _____________________________________________________________  Should you have questions after your visit to [facility], please contact our office at [phone] between the hours of 8:00 a.m. and 4:30 p.m.  Voicemails left after 4:00 p.m. will not be returned until the following business day.  For prescription refill requests, have your pharmacy contact our office and allow 72 hours.    Cancer Center Support Programs:   > Cancer Support Group  2nd Tuesday of the month 1pm-2pm, Journey Room

## 2019-04-27 DIAGNOSIS — I10 Essential (primary) hypertension: Secondary | ICD-10-CM | POA: Diagnosis not present

## 2019-04-27 DIAGNOSIS — E7849 Other hyperlipidemia: Secondary | ICD-10-CM | POA: Diagnosis not present

## 2019-05-07 ENCOUNTER — Other Ambulatory Visit (HOSPITAL_COMMUNITY): Payer: Self-pay | Admitting: *Deleted

## 2019-05-07 DIAGNOSIS — C3490 Malignant neoplasm of unspecified part of unspecified bronchus or lung: Secondary | ICD-10-CM

## 2019-05-07 MED ORDER — LORAZEPAM 1 MG PO TABS: 1.0000 mg | ORAL_TABLET | Freq: Two times a day (BID) | ORAL | 0 refills | Status: DC

## 2019-05-15 DIAGNOSIS — E039 Hypothyroidism, unspecified: Secondary | ICD-10-CM | POA: Diagnosis not present

## 2019-06-06 ENCOUNTER — Other Ambulatory Visit (HOSPITAL_COMMUNITY): Payer: Self-pay | Admitting: *Deleted

## 2019-06-06 DIAGNOSIS — C3490 Malignant neoplasm of unspecified part of unspecified bronchus or lung: Secondary | ICD-10-CM

## 2019-06-06 MED ORDER — LORAZEPAM 1 MG PO TABS: 1.0000 mg | ORAL_TABLET | Freq: Two times a day (BID) | ORAL | 0 refills | Status: DC

## 2019-07-03 DIAGNOSIS — E039 Hypothyroidism, unspecified: Secondary | ICD-10-CM | POA: Diagnosis not present

## 2019-07-05 ENCOUNTER — Other Ambulatory Visit (HOSPITAL_COMMUNITY): Payer: Self-pay | Admitting: *Deleted

## 2019-07-05 DIAGNOSIS — C3490 Malignant neoplasm of unspecified part of unspecified bronchus or lung: Secondary | ICD-10-CM

## 2019-07-06 MED ORDER — LORAZEPAM 1 MG PO TABS: 1.0000 mg | ORAL_TABLET | Freq: Two times a day (BID) | ORAL | 0 refills | Status: DC

## 2019-07-12 ENCOUNTER — Ambulatory Visit (HOSPITAL_COMMUNITY)
Admission: RE | Admit: 2019-07-12 | Discharge: 2019-07-12 | Disposition: A | Discharge status: Home / Self Care | Payer: Medicare Other | Source: Ambulatory Visit | Attending: Hematology | Admitting: Hematology

## 2019-07-12 ENCOUNTER — Other Ambulatory Visit: Payer: Self-pay

## 2019-07-12 ENCOUNTER — Inpatient Hospital Stay (HOSPITAL_COMMUNITY): Payer: Medicare Other | Attending: Hematology

## 2019-07-12 DIAGNOSIS — C3490 Malignant neoplasm of unspecified part of unspecified bronchus or lung: Secondary | ICD-10-CM | POA: Diagnosis not present

## 2019-07-12 DIAGNOSIS — J439 Emphysema, unspecified: Secondary | ICD-10-CM | POA: Diagnosis not present

## 2019-07-12 DIAGNOSIS — G319 Degenerative disease of nervous system, unspecified: Secondary | ICD-10-CM | POA: Insufficient documentation

## 2019-07-12 DIAGNOSIS — R05 Cough: Secondary | ICD-10-CM | POA: Diagnosis not present

## 2019-07-12 DIAGNOSIS — R630 Anorexia: Secondary | ICD-10-CM | POA: Insufficient documentation

## 2019-07-12 DIAGNOSIS — R200 Anesthesia of skin: Secondary | ICD-10-CM | POA: Diagnosis not present

## 2019-07-12 DIAGNOSIS — I700 Atherosclerosis of aorta: Secondary | ICD-10-CM | POA: Diagnosis not present

## 2019-07-12 DIAGNOSIS — I6782 Cerebral ischemia: Secondary | ICD-10-CM | POA: Diagnosis not present

## 2019-07-12 DIAGNOSIS — F17210 Nicotine dependence, cigarettes, uncomplicated: Secondary | ICD-10-CM | POA: Insufficient documentation

## 2019-07-12 LAB — CBC WITH DIFFERENTIAL/PLATELET
Abs Immature Granulocytes: 0.02 10*3/uL (ref 0.00–0.07)
Basophils Absolute: 0.1 10*3/uL (ref 0.0–0.1)
Basophils Relative: 1 %
Eosinophils Absolute: 0.2 10*3/uL (ref 0.0–0.5)
Eosinophils Relative: 4 %
HCT: 45.6 % (ref 39.0–52.0)
Hemoglobin: 14.8 g/dL (ref 13.0–17.0)
Immature Granulocytes: 0 %
Lymphocytes Relative: 17 %
Lymphs Abs: 1 10*3/uL (ref 0.7–4.0)
MCH: 31 pg (ref 26.0–34.0)
MCHC: 32.5 g/dL (ref 30.0–36.0)
MCV: 95.6 fL (ref 80.0–100.0)
Monocytes Absolute: 0.4 10*3/uL (ref 0.1–1.0)
Monocytes Relative: 7 %
Neutro Abs: 4.3 10*3/uL (ref 1.7–7.7)
Neutrophils Relative %: 71 %
Platelets: 149 10*3/uL — ABNORMAL LOW (ref 150–400)
RBC: 4.77 MIL/uL (ref 4.22–5.81)
RDW: 13.1 % (ref 11.5–15.5)
WBC: 6 10*3/uL (ref 4.0–10.5)
nRBC: 0 % (ref 0.0–0.2)

## 2019-07-12 LAB — COMPREHENSIVE METABOLIC PANEL
ALT: 12 U/L (ref 0–44)
AST: 15 U/L (ref 15–41)
Albumin: 3.7 g/dL (ref 3.5–5.0)
Alkaline Phosphatase: 73 U/L (ref 38–126)
Anion gap: 7 (ref 5–15)
BUN: 16 mg/dL (ref 8–23)
CO2: 28 mmol/L (ref 22–32)
Calcium: 8.5 mg/dL — ABNORMAL LOW (ref 8.9–10.3)
Chloride: 103 mmol/L (ref 98–111)
Creatinine, Ser: 1.34 mg/dL — ABNORMAL HIGH (ref 0.61–1.24)
GFR calc Af Amer: 59 mL/min — ABNORMAL LOW (ref >60)
GFR calc non Af Amer: 51 mL/min — ABNORMAL LOW (ref >60)
Glucose, Bld: 127 mg/dL — ABNORMAL HIGH (ref 70–99)
Potassium: 4.7 mmol/L (ref 3.5–5.1)
Sodium: 138 mmol/L (ref 135–145)
Total Bilirubin: 0.4 mg/dL (ref 0.3–1.2)
Total Protein: 7.1 g/dL (ref 6.5–8.1)

## 2019-07-12 MED ORDER — IOHEXOL 300 MG/ML  SOLN
75.0000 mL | Freq: Once | INTRAMUSCULAR | Status: AC | PRN
  Administered 2019-07-12: 75 mL via INTRAVENOUS

## 2019-07-17 ENCOUNTER — Inpatient Hospital Stay (HOSPITAL_BASED_OUTPATIENT_CLINIC_OR_DEPARTMENT_OTHER): Payer: Medicare Other | Admitting: Hematology

## 2019-07-17 ENCOUNTER — Other Ambulatory Visit: Payer: Self-pay

## 2019-07-17 ENCOUNTER — Encounter (HOSPITAL_COMMUNITY): Payer: Self-pay | Admitting: Hematology

## 2019-07-17 VITALS — BP 126/72 | HR 85 | Temp 97.5°F | Resp 18 | Wt 175.1 lb

## 2019-07-17 DIAGNOSIS — R200 Anesthesia of skin: Secondary | ICD-10-CM | POA: Diagnosis not present

## 2019-07-17 DIAGNOSIS — C3490 Malignant neoplasm of unspecified part of unspecified bronchus or lung: Secondary | ICD-10-CM

## 2019-07-17 DIAGNOSIS — R05 Cough: Secondary | ICD-10-CM | POA: Diagnosis not present

## 2019-07-17 NOTE — Progress Notes (Signed)
[place], [place] [address]   CLINIC:  Medical Oncology/Hematology  PCP:  Rory Percy, MD [address] [place] [address] [phone]   REASON FOR VISIT:  Follow-up for limited stage small cell lung  PRIOR THERAPY: 4 cycles palonosetron (Aloxi), 4 cycles of carboplatin (Paraplatin)  CURRENT THERAPY: Continue with healthy lifestyle and monitoring  BRIEF ONCOLOGIC HISTORY:  Oncology History  Small cell lung cancer ([place])  12/30/2017 Initial Diagnosis   Small cell lung cancer ([place])   01/18/2018 -  Chemotherapy   The patient had palonosetron (ALOXI) injection 0.25 mg, 0.25 mg, Intravenous,  Once, 4 of 4 cycles Administration: 0.25 mg (01/18/2018), 0.25 mg (02/08/2018), 0.25 mg (03/06/2018), 0.25 mg (03/27/2018) CARBOplatin (PARAPLATIN) 300 mg in sodium chloride 0.9 % 250 mL chemo infusion, 300 mg (100 % of original dose 300 mg), Intravenous,  Once, 4 of 4 cycles Dose modification: 300 mg (original dose 300 mg, Cycle 1), 409.5 mg (original dose 409.5 mg, Cycle 2),   (original dose 409.5 mg, Cycle 3), 409.5 mg (original dose 409.5 mg, Cycle 4) Administration: 300 mg (01/18/2018), 410 mg (02/08/2018), 410 mg (03/06/2018), 410 mg (03/27/2018) etoposide (VEPESID) 160 mg in sodium chloride 0.9 % 500 mL chemo infusion, 90 mg/m2 = 160 mg (90 % of original dose 100 mg/m2), Intravenous,  Once, 4 of 4 cycles Dose modification: 90 mg/m2 (90 % of original dose 100 mg/m2, Cycle 1, Reason: Patient Age) Administration: 160 mg (01/18/2018), 170 mg (01/19/2018), 170 mg (02/10/2018), 170 mg (02/08/2018), 170 mg (02/09/2018), 170 mg (01/20/2018), 170 mg (03/07/2018), 170 mg (03/06/2018), 170 mg (03/08/2018), 170 mg (03/27/2018), 170 mg (03/28/2018), 170 mg (03/29/2018) fosaprepitant (EMEND) 150 mg, dexamethasone (DECADRON) 12 mg in sodium chloride 0.9 % 145 mL IVPB, , Intravenous,  Once, 4 of 4 cycles Administration:  (01/18/2018),  (02/08/2018),  (03/06/2018),  (03/27/2018)  for  chemotherapy treatment.      CANCER STAGING: Cancer Staging No matching staging information was found for the patient.  INTERVAL HISTORY:  Terry Boyd, a 79 y.o. male, returns for routine follow-up of his limited stage small lung cell cancer. Terry Boyd was last seen on 04/10/2019.   He continues to have decreased fatigue and decreased appetite. He continues to have a cough as well and notes that he has significant sputum production, mostly in the morning. He also continues to have numbness in his fingers and toes, although it is mild.   REVIEW OF SYSTEMS:  Review of Systems  Constitutional: Positive for appetite change (moderate) and fatigue (moderate). Negative for chills and fever.  HENT:   Negative for lump/mass, mouth sores, sore throat and trouble swallowing.   Eyes: Negative for eye problems.  Respiratory: Positive for cough. Negative for chest tightness, shortness of breath and wheezing.        Sputum production in morning  Cardiovascular: Negative for chest pain and palpitations.  Gastrointestinal: Negative for abdominal pain, constipation, diarrhea, nausea and vomiting.  Genitourinary: Negative for bladder incontinence, dysuria, frequency and hematuria.   Musculoskeletal: Negative for arthralgias, back pain, flank pain and myalgias.  Skin: Negative for rash.  Neurological: Positive for numbness. Negative for dizziness, headaches and light-headedness.  Hematological: Does not bruise/bleed easily.  Psychiatric/Behavioral: Negative for depression. The patient is not nervous/anxious.     PAST MEDICAL/SURGICAL HISTORY:  Past Medical History:  Diagnosis Date  . AAA (abdominal aortic aneurysm) ([place])   . Alternating constipation and diarrhea   . Arthritis   .  Chronic back pain   . COPD (chronic obstructive pulmonary disease) ([place])   . Depression   . GERD (gastroesophageal reflux disease)   . History of hiatal hernia   . Hypertension    not on medications  .  Hypothyroidism   . Lung cancer ([place]) 2019  . Peripheral vascular disease ([place])   . Smoker   . Thyroid disease    Past Surgical History:  Procedure Laterality Date  . ABDOMINAL AORTAGRAM N/A 03/14/2012   Procedure: ABDOMINAL Maxcine Ham;  Surgeon: Serafina Mitchell, MD;  Location: [facility] CATH LAB;  Service: Cardiovascular;  Laterality: N/A;  . ABDOMINAL AORTIC ANEURYSM REPAIR  04-22-2009   Stent graft repair of AAA  . APPENDECTOMY    . BRONCHIAL BRUSHINGS Right 11/30/2017   Procedure: BRONCHIAL BRUSHINGS;  Surgeon: Sinda Du, MD;  [facility] END[facility];  Service: Cardiopulmonary;  Laterality: Right;  . BRONCHIAL WASHINGS Right 11/30/2017   Procedure: BRONCHIAL WASHINGS;  Surgeon: Sinda Du, MD;  [facility];  Service: Cardiopulmonary;  Laterality: Right;  . BYPASS GRAFT POPLITEAL TO POPLITEAL  03/20/2012   Procedure: BYPASS GRAFT POPLITEAL TO POPLITEAL;  Surgeon: Elam Dutch, MD;  Location: [facility];  Service: Vascular;  Laterality: Left;  ABOVE THE KNEE POPLITEAL ARTERY TO BELOW THE KNEE POPLITEAL ARTERY BYPASS GRAFT USING REVERSE LEFT GREATER SAPHENOUS VEIN   . ENDARTERECTOMY POPLITEAL  03/20/2012   Procedure: ENDARTERECTOMY POPLITEAL;  Surgeon: Elam Dutch, MD;  Location[facility] OR;  Service: Vascular;  Laterality: Left;   LIGATION OF LEFT LEG POPLITEAL ANEURYSM  . ESOPHAGOGASTRODUODENOSCOPY  09/28/2017   Dr. Lurlean Nanny: Medium sized hiatal hernia, residual food.  Suspect gastroparesis.  Gastric atony.  . ESOPHAGOGASTRODUODENOSCOPY  01/05/2018   [place].  Large amount of solid food upon entry to the stomach.  Pylorus appeared patent.  Normal duodenal mucosa without evidence of obstruction to the second portion of duodenum.  Balloon dilation performed of the pylorus.  Marland Kitchen FLEXIBLE BRONCHOSCOPY N/A 11/30/2017   Procedure: FLEXIBLE BRONCHOSCOPY;  Surgeon: Sinda Du, MD;  [facility];  Service: Cardiopulmonary;  Laterality: N/A;  . INTRAOPERATIVE  ARTERIOGRAM  03/20/2012   Procedure: INTRA OPERATIVE ARTERIOGRAM;  Surgeon: Elam Dutch, MD;  Location: [facility];  Service: Vascular;  Laterality: Left;  TIMES ONE  . PORTACATH PLACEMENT Left 01/06/2018   Procedure: INSERTION PORT-A-CATH;  Surgeon: Aviva Signs, MD;  Location: [facility] ORS;  Service: General;  Laterality: Left;  Marland Kitchen VASECTOMY  1982  . VIDEO BRONCHOSCOPY WITH ENDOBRONCHIAL ULTRASOUND N/A 12/23/2017   Procedure: VIDEO BRONCHOSCOPY WITH ENDOBRONCHIAL ULTRASOUND;  Surgeon: Melrose Nakayama, MD;  Location: [facility];  Service: Thoracic;  Laterality: N/A;    SOCIAL HISTORY:  Social History   Socioeconomic History  . Marital status: Divorced    Spouse name: Not on file  . Number of children: 2  . Years of education: Not on file  . Highest education level: Not on file  Occupational History  . Occupation: [place]     Comment: Agent Orange exposure  . Occupation: Sales executive    Comment: [place] exsposure  Tobacco Use  . Smoking status: Current Every Day Smoker    Years: 62.00    Types: Cigars  . Smokeless tobacco: Never Used  . Tobacco comment: pt states he smokes about 15 cigars per day  Substance and Sexual Activity  . Alcohol use: No  . Drug use: No  . Sexual activity: Yes    Birth control/protection: None  Other Topics Concern  .  Not on file  Social History Narrative   Sedentary   Social Determinants of Health   Financial Resource Strain:   . Difficulty of Paying Living Expenses:   Food Insecurity:   . Worried About Charity fundraiser in the Last Year:   . Arboriculturist in the Last Year:   Transportation Needs:   . Film/video editor (Medical):   Marland Kitchen Lack of Transportation (Non-Medical):   Physical Activity:   . Days of Exercise per Week:   . Minutes of Exercise per Session:   Stress:   . Feeling of Stress :   Social Connections:   . Frequency of Communication with Friends and Family:   . Frequency of Social Gatherings with Friends and Family:    . Attends Religious Services:   . Active Member of Clubs or Organizations:   . Attends Archivist Meetings:   Marland Kitchen Marital Status:   Intimate Partner Violence:   . Fear of Current or Ex-Partner:   . Emotionally Abused:   Marland Kitchen Physically Abused:   . Sexually Abused:     FAMILY HISTORY:  Family History  Problem Relation Age of Onset  . Diabetes Mother   . Stroke Mother   . Heart disease Father   . Diabetes Father   . Heart disease Sister        CABG x 4  . Cancer Brother        throat cancer  . Diabetes Sister   . Heart disease Sister   . Cancer Brother        pancreatic  . Prostate cancer Brother   . Kidney disease Son   . Colon cancer Neg Hx     CURRENT MEDICATIONS:  Current Outpatient Medications  Medication Sig Dispense Refill  . levothyroxine (SYNTHROID, LEVOTHROID) 137 MCG tablet Take 137 mcg by mouth daily before breakfast.     . LORazepam (ATIVAN) 1 MG tablet Take 1 tablet (1 mg total) by mouth 2 (two) times daily. 60 tablet 0  . Multiple Vitamins-Minerals (CENTRUM SILVER 50+MEN) TABS Take by mouth once.    . sertraline (ZOLOFT) 50 MG tablet Take 50 mg by mouth daily.    . simvastatin (ZOCOR) 10 MG tablet Take 10 mg by mouth at bedtime.    . Ipratropium-Albuterol (COMBIVENT RESPIMAT) 20-100 MCG/ACT AERS respimat Inhale 1 puff into the lungs every 6 (six) hours as needed for wheezing.     No current facility-administered medications for this visit.    ALLERGIES:  Allergies  Allergen Reactions  . Morphine And Related Shortness Of Breath and Other (See Comments)    Hallucinations pt has taken hydromorphone before    PHYSICAL EXAM:  Performance status (ECOG): 1 - Symptomatic but completely ambulatory  Vitals:   07/17/19 1447  BP: 126/72  Pulse: 85  Resp: 18  Temp: (!) 97.5 F (36.4 C)  SpO2: 95%   Wt Readings from Last 3 Encounters:  07/17/19 175 lb 1.6 oz (79.4 kg)  04/10/19 172 lb 4.8 oz (78.2 kg)  01/02/19 158 lb 4.8 oz (71.8 kg)    Physical Exam Vitals reviewed.  Constitutional:      Appearance: Normal appearance.  Cardiovascular:     Rate and Rhythm: Normal rate and regular rhythm.     Heart sounds: Normal heart sounds.  Pulmonary:     Effort: Pulmonary effort is normal.     Breath sounds: Normal breath sounds.  Abdominal:     General: There is no distension.  Palpations: Abdomen is soft. There is no mass.  Neurological:     General: No focal deficit present.     Mental Status: He is alert and oriented to person, place, and time.  Psychiatric:        Mood and Affect: Mood normal.        Behavior: Behavior normal.      LABORATORY DATA:  I have reviewed the labs as listed.  CBC Latest Ref Rng & Units 07/12/2019 04/06/2019 12/28/2018  WBC 4.0 - 10.5 K/uL 6.0 7.1 7.5  Hemoglobin 13.0 - 17.0 g/dL 14.8 15.2 13.6  Hematocrit 39.0 - 52.0 % 45.6 46.9 41.6  Platelets 150 - 400 K/uL 149(L) 153 160   CMP Latest Ref Rng & Units 07/12/2019 04/06/2019 12/28/2018  Glucose 70 - 99 mg/dL 127(H) 95 93  BUN 8 - 23 mg/dL _0 Creatinine 0.61 - 1.24 mg/dL 1.34(H) 1.13 1.05  Sodium 135 - 145 mmol/L 138 138 137  Potassium 3.5 - 5.1 mmol/L 4.7 4.5 4.3  Chloride 98 - 111 mmol/L 103 101 103  CO2 22 - 32 mmol/L _1 Calcium 8.9 - 10.3 mg/dL 8.5(L) 9.0 8.6(L)  Total Protein 6.5 - 8.1 g/dL 7.1 7.8 7.6  Total Bilirubin 0.3 - 1.2 mg/dL 0.4 0.5 0.7  Alkaline Phos 38 - 126 U/L 73 77 74  AST 15 - 41 U/L 15 14(L) 17  ALT 0 - 44 U/L _2 DIAGNOSTIC IMAGING:  I have independently reviewed the scans and discussed with the patient.  07/12/2019 CT Chest 1. No evidence of recurrent or metastatic carcinoma within the thorax.   07/12/2019 CT Head w/ & w/o Contrast 1. No evidence of intracranial metastatic disease. 2. Stable mild generalized parenchymal atrophy and chronic small vessel ischemic changes.   ASSESSMENT & PLAN:  Small cell lung cancer ([place]) 1.  Limited stage small cell lung cancer: -Chemoradiation  therapy with carboplatin and etoposide 4 cycles from 01/18/2018 through 03/27/2018. -PCA was declined. -Does not report any change in baseline cough.  He is continuing to smoke half to three fourths of a pack of cigarettes daily. -We reviewed results of CT of the head with and without contrast from 07/12/2019 with no evidence of metastatic disease. -CT of the chest with contrast also showed stable exam with no evidence of recurrence or metastatic disease. -We will continue to monitor with imaging every 3 months for the first 2 years.    Orders placed this encounter:  Orders Placed This Encounter  Procedures  . CT Chest W Contrast  . CT Head W Wo Contrast  . CBC with Differential/Platelet  . Comprehensive metabolic panel      Derek Jack, MD, 07/17/19 5:53 PM  [place] [phone]   I, Jacqualyn Posey, am acting as a scribe for Dr. Sanda Linger.  I, Derek Jack MD, have reviewed the above documentation for accuracy and completeness, and I agree with the above.

## 2019-07-17 NOTE — Patient Instructions (Signed)
[facility] at [facility] Discharge Instructions  You were seen today by Dr. Katragadda. He went over your recent results. He will see you back in 3 months for labs and follow up.   Thank you for choosing [facility] at [facility] to provide your oncology and hematology care.  To afford each patient quality time with our provider, please arrive at least 15 minutes before your scheduled appointment time.   If you have a lab appointment with the Cancer Center please come in thru the  Main Entrance and check in at the main information desk  You need to re-schedule your appointment should you arrive 10 or more minutes late.  We strive to give you quality time with our providers, and arriving late affects you and other patients whose appointments are after yours.  Also, if you no show three or more times for appointments you may be dismissed from the clinic at the providers discretion.     Again, thank you for choosing [facility].  Our hope is that these requests will decrease the amount of time that you wait before being seen by our physicians.       _____________________________________________________________  Should you have questions after your visit to [facility], please contact our office at [phone] between the hours of 8:00 a.m. and 4:30 p.m.  Voicemails left after 4:00 p.m. will not be returned until the following business day.  For prescription refill requests, have your pharmacy contact our office and allow 72 hours.    Cancer Center Support Programs:   > Cancer Support Group  2nd Tuesday of the month 1pm-2pm, Journey Room    

## 2019-07-17 NOTE — Assessment & Plan Note (Signed)
1.  Limited stage small cell lung cancer: -Chemoradiation therapy with carboplatin and etoposide 4 cycles from 01/18/2018 through 03/27/2018. -PCA was declined. -Does not report any change in baseline cough.  He is continuing to smoke half to three fourths of a pack of cigarettes daily. -We reviewed results of CT of the head with and without contrast from 07/12/2019 with no evidence of metastatic disease. -CT of the chest with contrast also showed stable exam with no evidence of recurrence or metastatic disease. -We will continue to monitor with imaging every 3 months for the first 2 years.

## 2019-08-07 ENCOUNTER — Other Ambulatory Visit (HOSPITAL_COMMUNITY): Payer: Self-pay | Admitting: *Deleted

## 2019-08-07 DIAGNOSIS — C3490 Malignant neoplasm of unspecified part of unspecified bronchus or lung: Secondary | ICD-10-CM

## 2019-08-07 MED ORDER — LORAZEPAM 1 MG PO TABS: 1.0000 mg | ORAL_TABLET | Freq: Two times a day (BID) | ORAL | 0 refills | Status: DC

## 2019-08-18 IMAGING — CR DG CHEST 1V PORT
1 series · 2 of 2 positions shown · non-contrast
Comparison: Chest x-ray dated December 23, 2017.

CLINICAL DATA: Port catheter placement.

EXAM:
PORTABLE CHEST 1 VIEW

[Series 1: portable · 0.17mm/px · 2 of 2 slices shown]
[im 1/2]
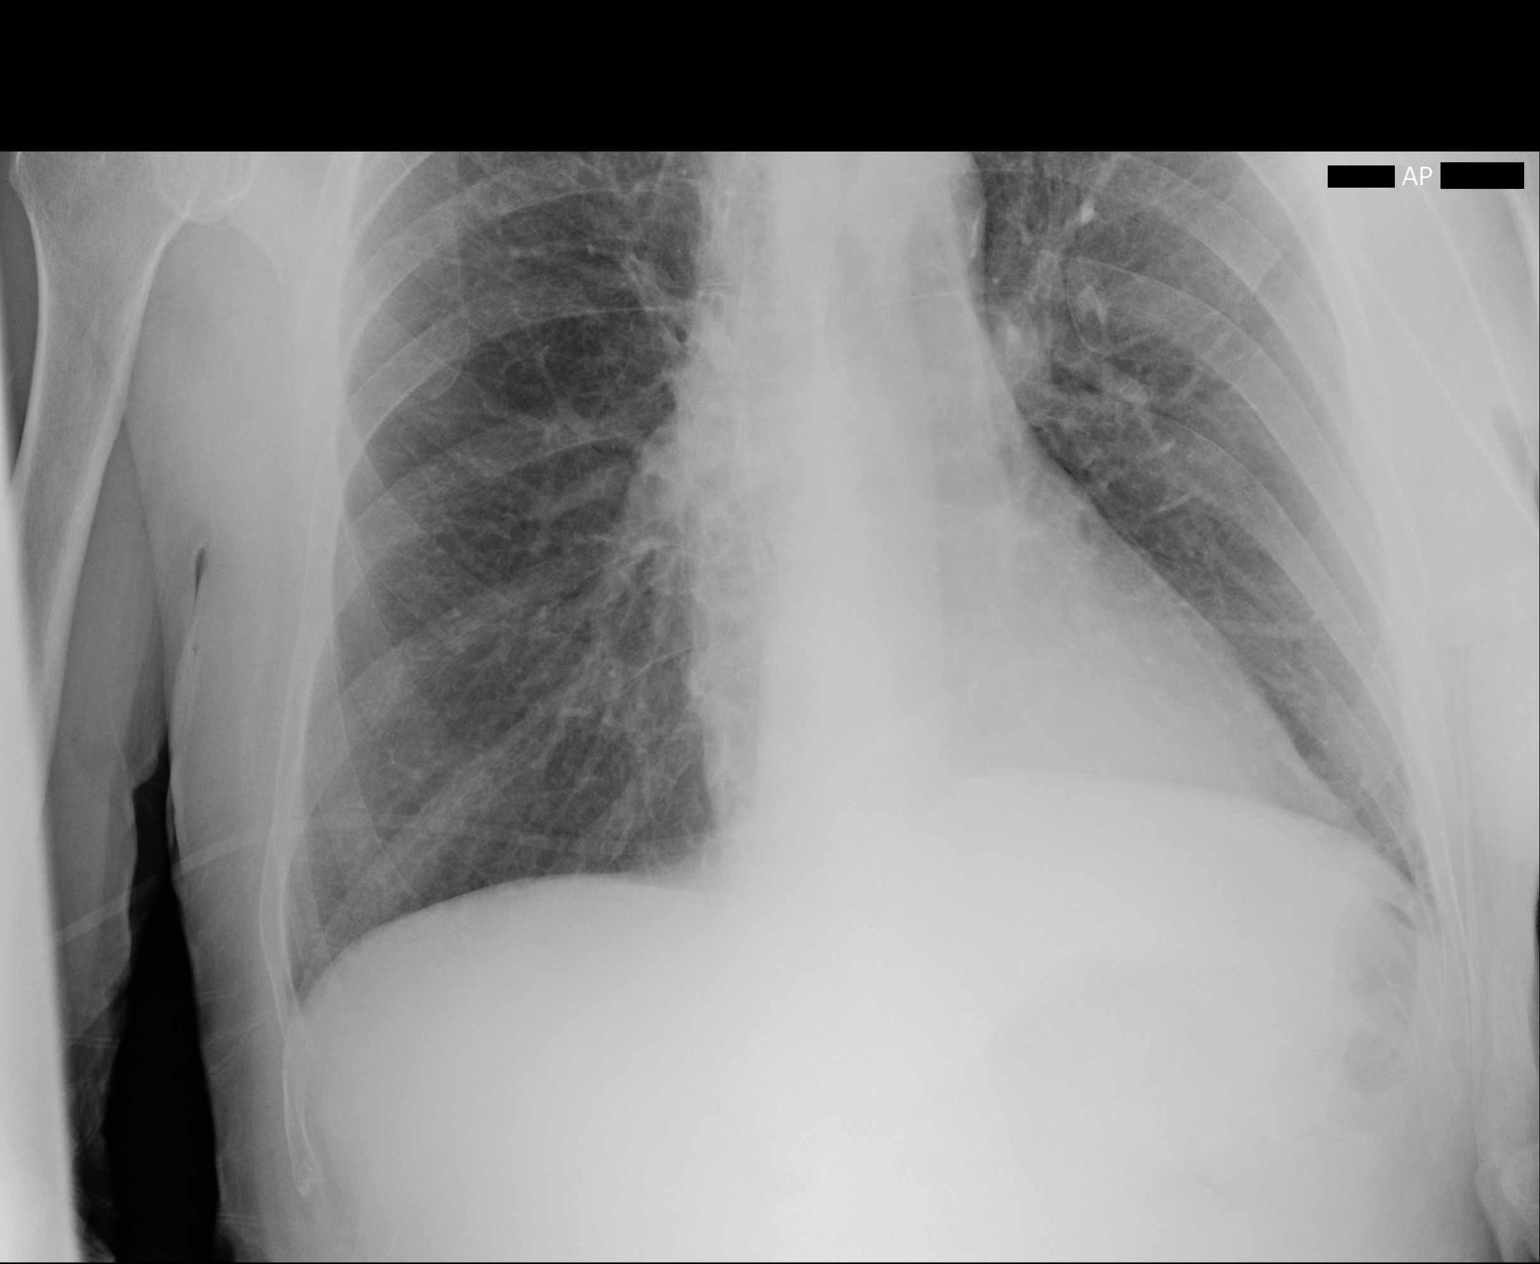
[im 2/2]
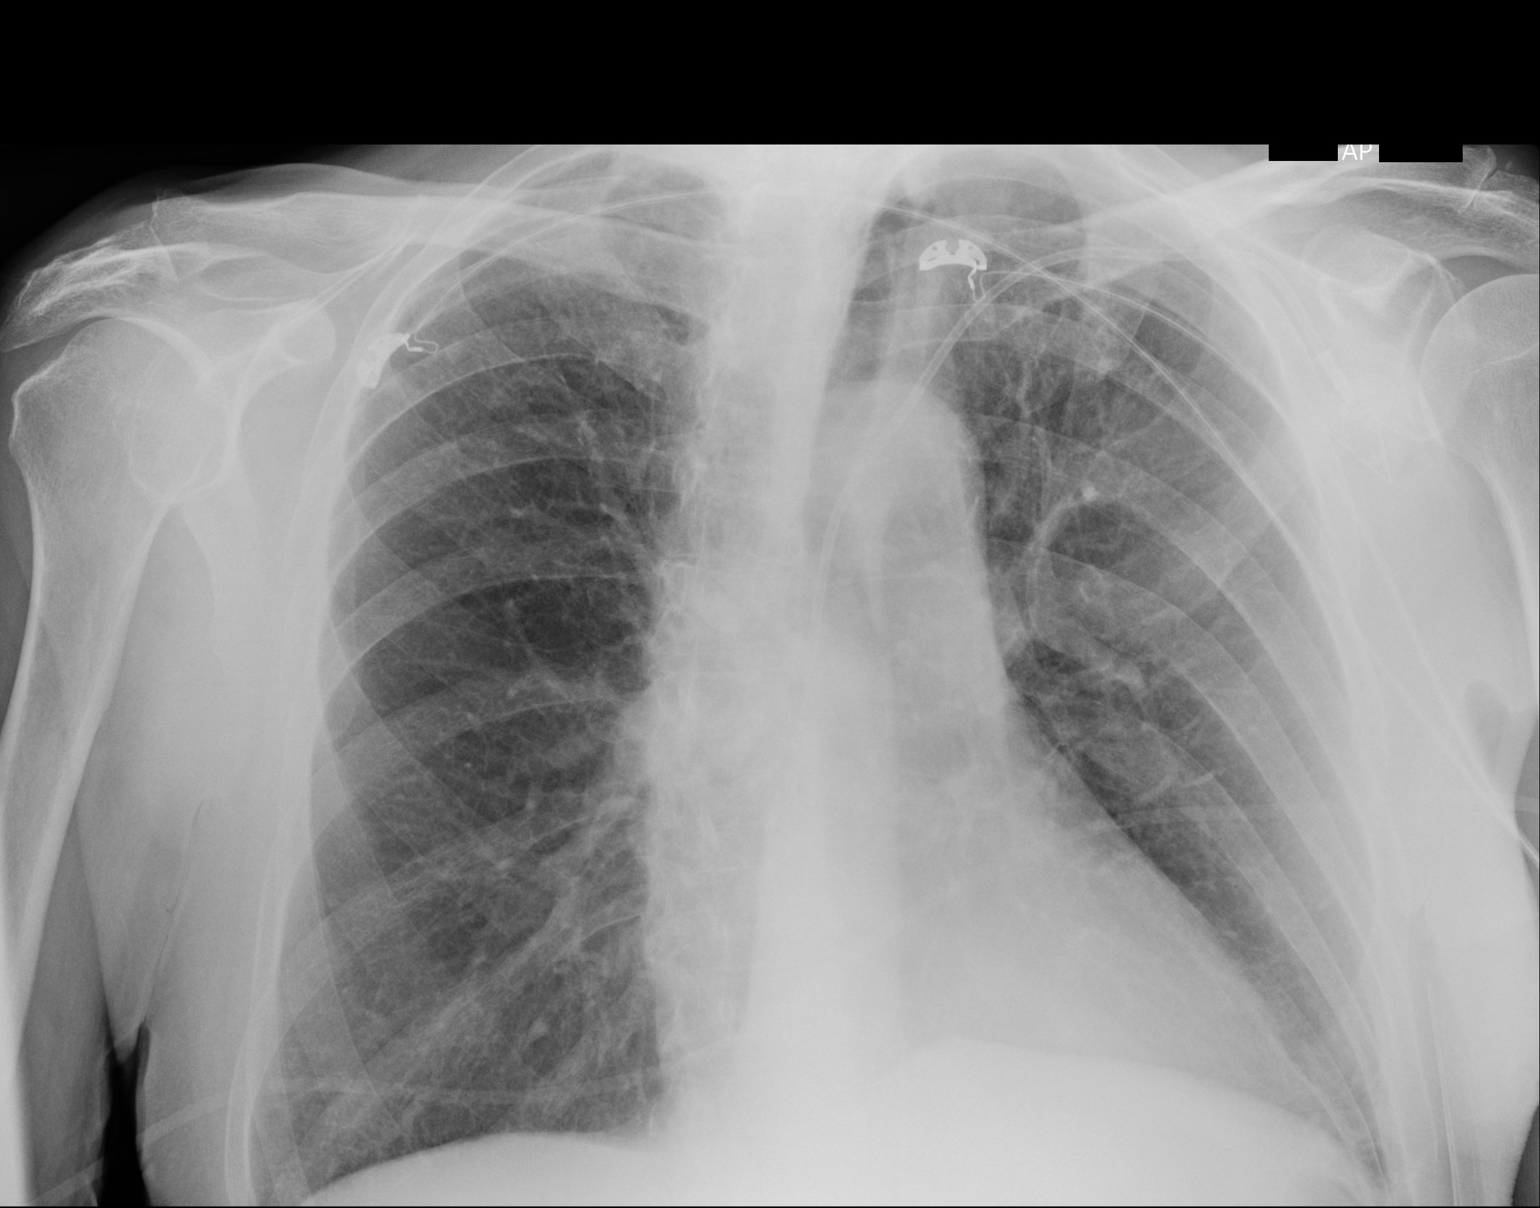

[2 of 2 positions shown; findings below may reference images not displayed]

FINDINGS: The patient is rotated to the left, significantly limiting
evaluation. Interval placement of a left chest wall port catheter
with the tip presumably at the cavoatrial junction. The heart size
and mediastinal contours are within normal limits. Normal pulmonary
vascularity. The lungs remain hyperinflated with emphysematous
changes. No focal consolidation, pleural effusion, or pneumothorax.
No acute osseous abnormality.
IMPRESSION: 1. Left chest wall port catheter with the tip presumably at the
cavoatrial junction, although evaluation is limited due to patient
rotation. Recommend repeat frontal chest x-ray.
2. No pneumothorax.
3. COPD.

## 2019-08-29 DIAGNOSIS — N183 Chronic kidney disease, stage 3 unspecified: Secondary | ICD-10-CM | POA: Diagnosis not present

## 2019-08-29 DIAGNOSIS — E039 Hypothyroidism, unspecified: Secondary | ICD-10-CM | POA: Diagnosis not present

## 2019-08-29 DIAGNOSIS — E7849 Other hyperlipidemia: Secondary | ICD-10-CM | POA: Diagnosis not present

## 2019-08-29 DIAGNOSIS — E1165 Type 2 diabetes mellitus with hyperglycemia: Secondary | ICD-10-CM | POA: Diagnosis not present

## 2019-09-06 ENCOUNTER — Other Ambulatory Visit (HOSPITAL_COMMUNITY): Payer: Self-pay | Admitting: *Deleted

## 2019-09-06 DIAGNOSIS — C3490 Malignant neoplasm of unspecified part of unspecified bronchus or lung: Secondary | ICD-10-CM

## 2019-09-06 MED ORDER — LORAZEPAM 1 MG PO TABS: 1.0000 mg | ORAL_TABLET | Freq: Two times a day (BID) | ORAL | 0 refills | Status: DC

## 2019-09-28 DIAGNOSIS — E039 Hypothyroidism, unspecified: Secondary | ICD-10-CM | POA: Diagnosis not present

## 2019-09-28 DIAGNOSIS — E1165 Type 2 diabetes mellitus with hyperglycemia: Secondary | ICD-10-CM | POA: Diagnosis not present

## 2019-09-28 DIAGNOSIS — E7849 Other hyperlipidemia: Secondary | ICD-10-CM | POA: Diagnosis not present

## 2019-09-28 DIAGNOSIS — N183 Chronic kidney disease, stage 3 unspecified: Secondary | ICD-10-CM | POA: Diagnosis not present

## 2019-10-05 ENCOUNTER — Other Ambulatory Visit (HOSPITAL_COMMUNITY): Payer: Self-pay

## 2019-10-05 DIAGNOSIS — C3490 Malignant neoplasm of unspecified part of unspecified bronchus or lung: Secondary | ICD-10-CM

## 2019-10-05 MED ORDER — LORAZEPAM 1 MG PO TABS: 1.0000 mg | ORAL_TABLET | Freq: Two times a day (BID) | ORAL | 0 refills | Status: DC

## 2019-10-16 ENCOUNTER — Inpatient Hospital Stay (HOSPITAL_COMMUNITY): Payer: Medicare Other | Attending: Hematology

## 2019-10-16 ENCOUNTER — Ambulatory Visit (HOSPITAL_COMMUNITY)
Admission: RE | Admit: 2019-10-16 | Discharge: 2019-10-16 | Disposition: A | Discharge status: Home / Self Care | Payer: Medicare Other | Source: Ambulatory Visit | Attending: Hematology | Admitting: Hematology

## 2019-10-16 ENCOUNTER — Other Ambulatory Visit: Payer: Self-pay

## 2019-10-16 DIAGNOSIS — E279 Disorder of adrenal gland, unspecified: Secondary | ICD-10-CM | POA: Diagnosis not present

## 2019-10-16 DIAGNOSIS — C3490 Malignant neoplasm of unspecified part of unspecified bronchus or lung: Secondary | ICD-10-CM | POA: Diagnosis not present

## 2019-10-16 DIAGNOSIS — Z9221 Personal history of antineoplastic chemotherapy: Secondary | ICD-10-CM | POA: Insufficient documentation

## 2019-10-16 DIAGNOSIS — I2510 Atherosclerotic heart disease of native coronary artery without angina pectoris: Secondary | ICD-10-CM | POA: Diagnosis not present

## 2019-10-16 DIAGNOSIS — I700 Atherosclerosis of aorta: Secondary | ICD-10-CM | POA: Diagnosis not present

## 2019-10-16 DIAGNOSIS — F17210 Nicotine dependence, cigarettes, uncomplicated: Secondary | ICD-10-CM | POA: Insufficient documentation

## 2019-10-16 DIAGNOSIS — J432 Centrilobular emphysema: Secondary | ICD-10-CM | POA: Diagnosis not present

## 2019-10-16 DIAGNOSIS — D696 Thrombocytopenia, unspecified: Secondary | ICD-10-CM | POA: Insufficient documentation

## 2019-10-16 DIAGNOSIS — I6782 Cerebral ischemia: Secondary | ICD-10-CM | POA: Insufficient documentation

## 2019-10-16 DIAGNOSIS — G319 Degenerative disease of nervous system, unspecified: Secondary | ICD-10-CM | POA: Diagnosis not present

## 2019-10-16 DIAGNOSIS — I7090 Unspecified atherosclerosis: Secondary | ICD-10-CM | POA: Diagnosis not present

## 2019-10-16 LAB — CBC WITH DIFFERENTIAL/PLATELET
Abs Immature Granulocytes: 0.04 10*3/uL (ref 0.00–0.07)
Basophils Absolute: 0.1 10*3/uL (ref 0.0–0.1)
Basophils Relative: 1 %
Eosinophils Absolute: 0.2 10*3/uL (ref 0.0–0.5)
Eosinophils Relative: 3 %
HCT: 46.3 % (ref 39.0–52.0)
Hemoglobin: 15.1 g/dL (ref 13.0–17.0)
Immature Granulocytes: 1 %
Lymphocytes Relative: 19 %
Lymphs Abs: 1.3 10*3/uL (ref 0.7–4.0)
MCH: 31.3 pg (ref 26.0–34.0)
MCHC: 32.6 g/dL (ref 30.0–36.0)
MCV: 95.9 fL (ref 80.0–100.0)
Monocytes Absolute: 0.7 10*3/uL (ref 0.1–1.0)
Monocytes Relative: 10 %
Neutro Abs: 4.6 10*3/uL (ref 1.7–7.7)
Neutrophils Relative %: 66 %
Platelets: 132 10*3/uL — ABNORMAL LOW (ref 150–400)
RBC: 4.83 MIL/uL (ref 4.22–5.81)
RDW: 13.3 % (ref 11.5–15.5)
WBC: 6.9 10*3/uL (ref 4.0–10.5)
nRBC: 0 % (ref 0.0–0.2)

## 2019-10-16 LAB — COMPREHENSIVE METABOLIC PANEL
ALT: 11 U/L (ref 0–44)
AST: 14 U/L — ABNORMAL LOW (ref 15–41)
Albumin: 3.8 g/dL (ref 3.5–5.0)
Alkaline Phosphatase: 69 U/L (ref 38–126)
Anion gap: 9 (ref 5–15)
BUN: 16 mg/dL (ref 8–23)
CO2: 28 mmol/L (ref 22–32)
Calcium: 8.6 mg/dL — ABNORMAL LOW (ref 8.9–10.3)
Chloride: 101 mmol/L (ref 98–111)
Creatinine, Ser: 1.16 mg/dL (ref 0.61–1.24)
GFR calc Af Amer: >60 mL/min (ref >60)
GFR calc non Af Amer: >60 mL/min (ref >60)
Glucose, Bld: 120 mg/dL — ABNORMAL HIGH (ref 70–99)
Potassium: 4.3 mmol/L (ref 3.5–5.1)
Sodium: 138 mmol/L (ref 135–145)
Total Bilirubin: 0.5 mg/dL (ref 0.3–1.2)
Total Protein: 7.4 g/dL (ref 6.5–8.1)

## 2019-10-16 MED ORDER — IOHEXOL 300 MG/ML  SOLN
150.0000 mL | Freq: Once | INTRAMUSCULAR | Status: AC | PRN
  Administered 2019-10-16: 125 mL via INTRAVENOUS

## 2019-10-17 ENCOUNTER — Ambulatory Visit (HOSPITAL_COMMUNITY): Payer: Medicare Other

## 2019-10-23 ENCOUNTER — Other Ambulatory Visit: Payer: Self-pay

## 2019-10-23 ENCOUNTER — Inpatient Hospital Stay (HOSPITAL_BASED_OUTPATIENT_CLINIC_OR_DEPARTMENT_OTHER): Payer: Medicare Other | Admitting: Hematology

## 2019-10-23 ENCOUNTER — Encounter (HOSPITAL_COMMUNITY): Payer: Self-pay | Admitting: Hematology

## 2019-10-23 VITALS — BP 121/78 | HR 91 | Temp 97.3°F | Resp 20 | Wt 177.7 lb

## 2019-10-23 DIAGNOSIS — D696 Thrombocytopenia, unspecified: Secondary | ICD-10-CM | POA: Diagnosis not present

## 2019-10-23 DIAGNOSIS — Z9221 Personal history of antineoplastic chemotherapy: Secondary | ICD-10-CM | POA: Diagnosis not present

## 2019-10-23 DIAGNOSIS — E279 Disorder of adrenal gland, unspecified: Secondary | ICD-10-CM | POA: Diagnosis not present

## 2019-10-23 DIAGNOSIS — F17210 Nicotine dependence, cigarettes, uncomplicated: Secondary | ICD-10-CM | POA: Diagnosis not present

## 2019-10-23 DIAGNOSIS — C3490 Malignant neoplasm of unspecified part of unspecified bronchus or lung: Secondary | ICD-10-CM

## 2019-10-23 NOTE — Progress Notes (Signed)
Terry Boyd, Terry Boyd [id number]   CLINIC:  Medical Oncology/Hematology  PCP:  Rory Percy, MD [address] [place] [address] [phone]   REASON FOR VISIT:  Follow-up for limited stage small cell lung cancer  PRIOR THERAPY: Carboplatin and etoposide x 4 cycles from 01/18/2018 to 03/29/2018  NGS Results: Not done  CURRENT THERAPY: Observation  BRIEF ONCOLOGIC HISTORY:  Oncology History  Small cell lung cancer ([place])  12/30/2017 Initial Diagnosis   Small cell lung cancer ([place])   01/18/2018 - 03/29/2018 Chemotherapy   The patient had palonosetron (ALOXI) injection 0.25 mg, 0.25 mg, Intravenous,  Once, 4 of 4 cycles Administration: 0.25 mg (01/18/2018), 0.25 mg (02/08/2018), 0.25 mg (03/06/2018), 0.25 mg (03/27/2018) CARBOplatin (PARAPLATIN) 300 mg in sodium chloride 0.9 % 250 mL chemo infusion, 300 mg (100 % of original dose 300 mg), Intravenous,  Once, 4 of 4 cycles Dose modification: 300 mg (original dose 300 mg, Cycle 1), 409.5 mg (original dose 409.5 mg, Cycle 2),   (original dose 409.5 mg, Cycle 3), 409.5 mg (original dose 409.5 mg, Cycle 4) Administration: 300 mg (01/18/2018), 410 mg (02/08/2018), 410 mg (03/06/2018), 410 mg (03/27/2018) etoposide (VEPESID) 160 mg in sodium chloride 0.9 % 500 mL chemo infusion, 90 mg/m2 = 160 mg (90 % of original dose 100 mg/m2), Intravenous,  Once, 4 of 4 cycles Dose modification: 90 mg/m2 (90 % of original dose 100 mg/m2, Cycle 1, Reason: Patient Age) Administration: 160 mg (01/18/2018), 170 mg (01/19/2018), 170 mg (02/10/2018), 170 mg (02/08/2018), 170 mg (02/09/2018), 170 mg (01/20/2018), 170 mg (03/07/2018), 170 mg (03/06/2018), 170 mg (03/08/2018), 170 mg (03/27/2018), 170 mg (03/28/2018), 170 mg (03/29/2018) fosaprepitant (EMEND) 150 mg, dexamethasone (DECADRON) 12 mg in sodium chloride 0.9 % 145 mL IVPB, , Intravenous,  Once, 4 of 4 cycles Administration:  (01/18/2018),  (02/08/2018),  (03/06/2018),  (03/27/2018)   for chemotherapy treatment.      CANCER STAGING: Cancer Staging No matching staging information was found for the patient.  INTERVAL HISTORY:  Terry Boyd, a 79 y.o. male, returns for routine follow-up of his limited stage small cell lung cancer. Terry Boyd was last seen on 07/17/2019.  Today he reports feeling well. He denies any recent falls, headaches, dizziness, and his appetite is good.  He continues smoking 1/2 PPD.   REVIEW OF SYSTEMS:  Review of Systems  Constitutional: Positive for appetite change (mildly decreased) and fatigue (moderate).  Neurological: Negative for dizziness and headaches.  All other systems reviewed and are negative.   PAST MEDICAL/SURGICAL HISTORY:  Past Medical History:  Diagnosis Date  . AAA (abdominal aortic aneurysm) ([place])   . Alternating constipation and diarrhea   . Arthritis   . Chronic back pain   . COPD (chronic obstructive pulmonary disease) ([place])   . Depression   . GERD (gastroesophageal reflux disease)   . History of hiatal hernia   . Hypertension    not on medications  . Hypothyroidism   . Lung cancer ([place]) 2019  . Peripheral vascular disease ([place])   . Smoker   . Thyroid disease    Past Surgical History:  Procedure Laterality Date  . ABDOMINAL AORTAGRAM N/A 03/14/2012   Procedure: ABDOMINAL Maxcine Ham;  Surgeon: Serafina Mitchell, MD;  Location: [facility] CATH LAB;  Service: Cardiovascular;  Laterality: N/A;  . ABDOMINAL AORTIC ANEURYSM REPAIR  04-22-2009   Stent graft repair of AAA  . APPENDECTOMY    . BRONCHIAL BRUSHINGS Right 11/30/2017  Procedure: BRONCHIAL BRUSHINGS;  Surgeon: Sinda Du, MD;  [facility] ENDO SU[facility];  Service: Cardiopulmonary;  Laterality: Right;  . BRONCHIAL WASHINGS Right 11/30/2017   Procedure: BRONCHIAL WASHINGS;  Surgeon: Sinda Du, MD;  [facility];  Service: Cardiopulmonary;  Laterality: Right;  . BYPASS GRAFT POPLITEAL TO POPLITEAL  03/20/2012   Procedure: BYPASS GRAFT  POPLITEAL TO POPLITEAL;  Surgeon: Elam Dutch, MD;  Location: [facility];  Service: Vascular;  Laterality: Left;  ABOVE THE KNEE POPLITEAL ARTERY TO BELOW THE KNEE POPLITEAL ARTERY BYPASS GRAFT USING REVERSE LEFT GREATER SAPHENOUS VEIN   . ENDARTERECTOMY POPLITEAL  03/20/2012   Procedure: ENDARTERECTOMY POPLITEAL;  Surgeon: Elam Dutch, MD;  Location: [facility] OR;  Service: Vascular;  Laterality: Left;   LIGATION OF LEFT LEG POPLITEAL ANEURYSM  . ESOPHAGOGASTRODUODENOSCOPY  09/28/2017   Dr. Lurlean Nanny: Medium sized hiatal hernia, residual food.  Suspect gastroparesis.  Gastric atony.  . ESOPHAGOGASTRODUODENOSCOPY  01/05/2018   [place].  Large amount of solid food upon entry to the stomach.  Pylorus appeared patent.  Normal duodenal mucosa without evidence of obstruction to the second portion of duodenum.  Balloon dilation performed of the pylorus.  Marland Kitchen FLEXIBLE BRONCHOSCOPY N/A 11/30/2017   Procedure: FLEXIBLE BRONCHOSCOPY;  Surgeon: Sinda Du, MD;  [facility];  Service: Cardiopulmonary;  Laterality: N/A;  . INTRAOPERATIVE ARTERIOGRAM  03/20/2012   Procedure: INTRA OPERATIVE ARTERIOGRAM;  Surgeon: Elam Dutch, MD;  Location: [facility];  Service: Vascular;  Laterality: Left;  TIMES ONE  . PORTACATH PLACEMENT Left 01/06/2018   Procedure: INSERTION PORT-A-CATH;  Surgeon: Aviva Signs, MD;  Location: [facility] ORS;  Service: General;  Laterality: Left;  [place] Kitchen VASECTOMY  1982  . VIDEO BRONCHOSCOPY WITH ENDOBRONCHIAL ULTRASOUND N/A 12/23/2017   Procedure: VIDEO BRONCHOSCOPY WITH ENDOBRONCHIAL ULTRASOUND;  Surgeon: Melrose Nakayama, MD;  Location: [facility];  Service: Thoracic;  Laterality: N/A;    SOCIAL HISTORY:  Social History   Socioeconomic History  . Marital status: Divorced    Spouse name: Not on file  . Number of children: 2  . Years of education: Not on file  . Highest education level: Not on file  Occupational History  . Occupation: [place]     Comment: Agent Orange  exposure  . Occupation: Sales executive    Comment: [place] exsposure  Tobacco Use  . Smoking status: Current Every Day Smoker    Years: 62.00    Types: Cigars  . Smokeless tobacco: Never Used  . Tobacco comment: pt states he smokes about 15 cigars per day  Vaping Use  . Vaping Use: Never used  Substance and Sexual Activity  . Alcohol use: No  . Drug use: No  . Sexual activity: Yes    Birth control/protection: None  Other Topics Concern  . Not on file  Social History Narrative   Sedentary   Social Determinants of Health   Financial Resource Strain:   . Difficulty of Paying Living Expenses: Not on file  Food Insecurity:   . Worried About Charity fundraiser in the Last Year: Not on file  . Ran Out of Food in the Last Year: Not on file  Transportation Needs:   . Lack of Transportation (Medical): Not on file  . Lack of Transportation (Non-Medical): Not on file  Physical Activity:   . Days of Exercise per Week: Not on file  . Minutes of Exercise per Session: Not on file  Stress:   . Feeling of Stress : Not  on file  Social Connections:   . Frequency of Communication with Friends and Family: Not on file  . Frequency of Social Gatherings with Friends and Family: Not on file  . Attends Religious Services: Not on file  . Active Member of Clubs or Organizations: Not on file  . Attends Archivist Meetings: Not on file  . Marital Status: Not on file  Intimate Partner Violence:   . Fear of Current or Ex-Partner: Not on file  . Emotionally Abused: Not on file  . Physically Abused: Not on file  . Sexually Abused: Not on file    FAMILY HISTORY:  Family History  Problem Relation Age of Onset  . Diabetes Mother   . Stroke Mother   . Heart disease Father   . Diabetes Father   . Heart disease Sister        CABG x 4  . Cancer Brother        throat cancer  . Diabetes Sister   . Heart disease Sister   . Cancer Brother        pancreatic  . Prostate cancer  Brother   . Kidney disease Son   . Colon cancer Neg Hx     CURRENT MEDICATIONS:  Current Outpatient Medications  Medication Sig Dispense Refill  . Ipratropium-Albuterol (COMBIVENT RESPIMAT) 20-100 MCG/ACT AERS respimat Inhale 1 puff into the lungs every 6 (six) hours as needed for wheezing.    Marland Kitchen levothyroxine (SYNTHROID) 150 MCG tablet Take 150 mcg by mouth every morning.    Marland Kitchen LORazepam (ATIVAN) 1 MG tablet Take 1 tablet (1 mg total) by mouth 2 (two) times daily. 60 tablet 0  . sertraline (ZOLOFT) 50 MG tablet Take 50 mg by mouth daily.    . simvastatin (ZOCOR) 10 MG tablet Take 10 mg by mouth at bedtime.    . vitamin B-12 (CYANOCOBALAMIN) 250 MCG tablet Take 250 mcg by mouth daily.     No current facility-administered medications for this visit.    ALLERGIES:  Allergies  Allergen Reactions  . Morphine And Related Shortness Of Breath and Other (See Comments)    Hallucinations pt has taken hydromorphone before    PHYSICAL EXAM:  Performance status (ECOG): 1 - Symptomatic but completely ambulatory  Vitals:   10/23/19 0812  BP: 121/78  Pulse: 91  Resp: 20  Temp: (!) 97.3 F (36.3 C)  SpO2: 96%   Wt Readings from Last 3 Encounters:  10/23/19 177 lb 11.2 oz (80.6 kg)  07/17/19 175 lb 1.6 oz (79.4 kg)  04/10/19 172 lb 4.8 oz (78.2 kg)   Physical Exam Vitals reviewed.  Constitutional:      Appearance: Normal appearance.  Cardiovascular:     Rate and Rhythm: Normal rate and regular rhythm.     Pulses: Normal pulses.     Heart sounds: Normal heart sounds.  Pulmonary:     Effort: Pulmonary effort is normal.     Breath sounds: Normal breath sounds.  Chest:     Comments: Port-a-Cath in L chest Abdominal:     Palpations: Abdomen is soft. There is no mass.     Tenderness: There is no abdominal tenderness.  Lymphadenopathy:     Cervical: No cervical adenopathy.     Upper Body:     Right upper body: No supraclavicular or axillary adenopathy.     Left upper body: No  supraclavicular or axillary adenopathy.  Neurological:     General: No focal deficit present.  Mental Status: He is alert and oriented to person, place, and time.  Psychiatric:        Mood and Affect: Mood normal.        Behavior: Behavior normal.      LABORATORY DATA:  I have reviewed the labs as listed.  CBC Latest Ref Rng & Units 10/16/2019 07/12/2019 04/06/2019  WBC 4.0 - 10.5 K/uL 6.9 6.0 7.1  Hemoglobin 13.0 - 17.0 g/dL 15.1 14.8 15.2  Hematocrit 39 - 52 % 46.3 45.6 46.9  Platelets 150 - 400 K/uL 132(L) 149(L) 153   CMP Latest Ref Rng & Units 10/16/2019 07/12/2019 04/06/2019  Glucose 70 - 99 mg/dL 120(H) 127(H) 95  BUN 8 - 23 mg/dL _0 Creatinine 0.61 - 1.24 mg/dL 1.16 1.34(H) 1.13  Sodium 135 - 145 mmol/L 138 138 138  Potassium 3.5 - 5.1 mmol/L 4.3 4.7 4.5  Chloride 98 - 111 mmol/L 101 103 101  CO2 22 - 32 mmol/L _1 Calcium 8.9 - 10.3 mg/dL 8.6(L) 8.5(L) 9.0  Total Protein 6.5 - 8.1 g/dL 7.4 7.1 7.8  Total Bilirubin 0.3 - 1.2 mg/dL 0.5 0.4 0.5  Alkaline Phos 38 - 126 U/L 69 73 77  AST 15 - 41 U/L 14(L) 15 14(L)  ALT 0 - 44 U/L _2 Lab Results  Component Value Date   LDH 123 04/06/2019    DIAGNOSTIC IMAGING:  I have independently reviewed the scans and discussed with the patient. CT Head W Wo Contrast  Result Date: 10/16/2019 CLINICAL DATA:  Small-cell lung cancer. Additional provided by scanning technologist: History of small cell lung cancer diagnosed November 2019, status post chemotherapy EXAM: CT HEAD WITHOUT AND WITH CONTRAST TECHNIQUE: Contiguous axial images were obtained from the base of the skull through the vertex without and with intravenous contrast CONTRAST:  139m OMNIPAQUE IOHEXOL 300 MG/ML  SOLN COMPARISON:  Prior head CT 05/12/2019 FINDINGS: Brain: Stable, mild generalized parenchymal atrophy. Stable, mild ill-defined hypoattenuation within the cerebral white matter which is nonspecific, but consistent with chronic small vessel ischemic  disease. No abnormal enhancement is identified to suggest intracranial metastatic disease. There is no acute intracranial hemorrhage. No demarcated cortical infarct. No extra-axial fluid collection. No evidence of intracranial mass. No midline shift. Vascular: No hyperdense vessel on precontrast imaging. Atherosclerotic calcifications. Skull: Normal. Negative for fracture or focal lesion. Sinuses/Orbits: Visualized orbits show no acute finding. No significant paranasal sinus disease or mastoid effusion at the imaged levels. IMPRESSION: No CT evidence of intracranial metastatic disease. Stable mild generalized parenchymal atrophy and chronic small vessel ischemic disease. Electronically Signed   By: KKellie SimmeringDO   On: 10/16/2019 11:41   CT Chest W Contrast  Result Date: 10/16/2019 CLINICAL DATA:  Restaging small cell lung cancer. EXAM: CT CHEST WITH CONTRAST TECHNIQUE: Multidetector CT imaging of the chest was performed during intravenous contrast administration. CONTRAST:  1241mOMNIPAQUE IOHEXOL 300 MG/ML  SOLN COMPARISON:  07/12/2019 FINDINGS: Cardiovascular: The heart size is within normal limits. There is no pericardial effusion identified. Aortic atherosclerosis. Three vessel coronary artery atherosclerotic calcifications. Mediastinum/Nodes: Thyroid gland appears atrophic. The trachea appears patent and is midline. Unchanged appearance of mild wall thickening involving the right mainstem bronchus. Somewhat patulous and dilated proximal and mid esophagus appears stable from previous exam. No mediastinal mass or adenopathy identified. No enlarged supraclavicular or axillary adenopathy. Lungs/Pleura: Bilateral peripheral predominant interstitial reticulation is again noted. Centrilobular and paraseptal emphysema. No pleural effusion, airspace consolidation or atelectasis.  No findings of recurrent pulmonary nodule or mass. Upper Abdomen: No acute abnormality. Calcified granulomas identified within the  spleen. Right adrenal gland nodule measures 1.2 cm and appears stable from previous exam. Musculoskeletal: No acute or suspicious osseous findings. IMPRESSION: 1. Stable CT of the chest. No specific findings identified to suggest residual or recurrent tumor or metastatic disease. 2. Three vessel coronary artery atherosclerotic calcifications. 3. Stable right adrenal gland nodule. 4. Aortic atherosclerosis. Aortic Atherosclerosis (ICD10-I70.0). Electronically Signed   By: Kerby Moors M.D.   On: 10/16/2019 13:57     ASSESSMENT:  1.  Limited stage small cell lung cancer: -Chemoradiation therapy with carboplatin and etoposide 4 cycles from 01/18/2018 through 03/27/2018. -PCI was declined. -CT chest with contrast on 10/16/2019 did not show any residual or recurrent tumor or metastatic disease.  Stable right adrenal gland nodule. -CT head with and without contrast on 10/16/2019 was negative for brain meta stasis.   PLAN:  1.  Limited stage small cell lung cancer: -He does not have any clinical signs or symptoms of recurrence at this time. -I have reviewed his labs from 10/16/2019 which showed normal LFTs and CBC. -Mild thrombocytopenia with platelet count of 132 stable. -Recommended follow-up in 4 months with repeat CT chest and brain imaging.    Orders placed this encounter:  Orders Placed This Encounter  Procedures  . CT Chest W Contrast  . MR Brain W Wo Contrast     Derek Jack, MD [place] [phone]   I, Milinda Antis, am acting as a scribe for Dr. Sanda Linger.  I, Derek Jack MD, have reviewed the above documentation for accuracy and completeness, and I agree with the above.

## 2019-10-23 NOTE — Patient Instructions (Signed)
[place] at [facility] Discharge Instructions  You were seen today by Dr. Delton Coombes. He went over your recent results and scans. You will be scheduled for a CT scan of your chest and an MRI of your brain before your next visit. Dr. Delton Coombes will see you back in 4 months for labs and follow up.   Thank you for choosing [place] at [facility] to provide your oncology and hematology care.  To afford each patient quality time with our provider, please arrive at least 15 minutes before your scheduled appointment time.   If you have a lab appointment with the [facility] please come in thru the Main Entrance and check in at the main information desk  You need to re-schedule your appointment should you arrive 10 or more minutes late.  We strive to give you quality time with our providers, and arriving late affects you and other patients whose appointments are after yours.  Also, if you no show three or more times for appointments you may be dismissed from the clinic at the providers discretion.     Again, thank you for choosing [facility].  Our hope is that these requests will decrease the amount of time that you wait before being seen by our physicians.       _____________________________________________________________  Should you have questions after your visit to [facility], please contact our office at [phone] between the hours of 8:00 a.m. and 4:30 p.m.  Voicemails left after 4:00 p.m. will not be returned until the following business day.  For prescription refill requests, have your pharmacy contact our office and allow 72 hours.    Cancer Center Support Programs:   > Cancer Support Group  2nd Tuesday of the month 1pm-2pm, Journey Room

## 2019-10-30 DIAGNOSIS — E1165 Type 2 diabetes mellitus with hyperglycemia: Secondary | ICD-10-CM | POA: Diagnosis not present

## 2019-10-30 DIAGNOSIS — N183 Chronic kidney disease, stage 3 unspecified: Secondary | ICD-10-CM | POA: Diagnosis not present

## 2019-10-30 DIAGNOSIS — E7849 Other hyperlipidemia: Secondary | ICD-10-CM | POA: Diagnosis not present

## 2019-10-30 DIAGNOSIS — Z720 Tobacco use: Secondary | ICD-10-CM | POA: Diagnosis not present

## 2019-10-30 DIAGNOSIS — E039 Hypothyroidism, unspecified: Secondary | ICD-10-CM | POA: Diagnosis not present

## 2019-11-06 ENCOUNTER — Other Ambulatory Visit (HOSPITAL_COMMUNITY): Payer: Self-pay

## 2019-11-06 DIAGNOSIS — C3490 Malignant neoplasm of unspecified part of unspecified bronchus or lung: Secondary | ICD-10-CM

## 2019-11-06 MED ORDER — LORAZEPAM 1 MG PO TABS: 1.0000 mg | ORAL_TABLET | Freq: Two times a day (BID) | ORAL | 0 refills | Status: DC

## 2019-11-08 DIAGNOSIS — Z885 Allergy status to narcotic agent status: Secondary | ICD-10-CM | POA: Diagnosis not present

## 2019-11-08 DIAGNOSIS — K219 Gastro-esophageal reflux disease without esophagitis: Secondary | ICD-10-CM | POA: Diagnosis not present

## 2019-11-08 DIAGNOSIS — E0590 Thyrotoxicosis, unspecified without thyrotoxic crisis or storm: Secondary | ICD-10-CM | POA: Diagnosis not present

## 2019-11-08 DIAGNOSIS — Z79899 Other long term (current) drug therapy: Secondary | ICD-10-CM | POA: Diagnosis not present

## 2019-11-23 IMAGING — CT NM PET TUM IMG RESTAG (PS) SKULL BASE T - THIGH
1 of 9 series · 1 of 25 positions shown · non-contrast
Comparison: Multiple exams, including 11/28/2017 and CT chest from
01/12/2018

CLINICAL DATA: Subsequent treatment strategy for small cell lung
cancer.

EXAM:
NUCLEAR MEDICINE PET SKULL BASE TO THIGH
TECHNIQUE: 6.8 mCi F-18 FDG was injected intravenously. Full-ring PET imaging
was performed from the skull base to thigh after the radiotracer. CT
data was obtained and used for attenuation correction and anatomic
localization.
Fasting blood glucose: 107 mg/dl

[Series 4: ct sk_thigh 5.0 b31f · axial · 5.0mm · 0.98mm/px · 1 of 225 slices shown]
[im 225/225  brain]
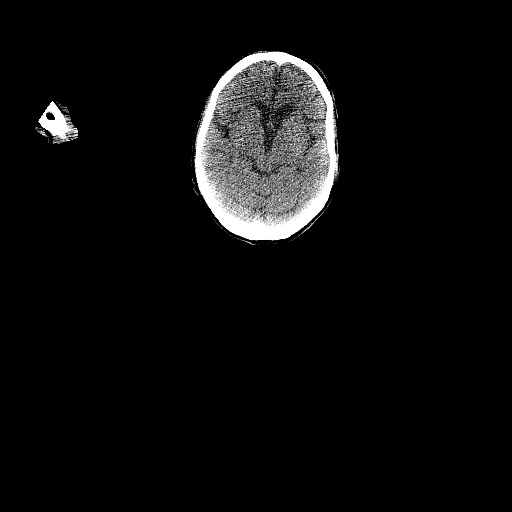

[1 of 25 positions shown; findings below may reference images not displayed]

FINDINGS: Mediastinal blood pool activity: SUV max

NECK: No significant abnormal hypermetabolic activity in this
region.

Incidental CT findings: Bilateral common carotid atherosclerotic
calcification.

CHEST: The overall pattern of adenopathy in the chest is improved
with in number of the previously hypermetabolic lymph nodes having
resolved or improved. For example, the previous right paratracheal
lymph node that had a maximum SUV of 8.0 has resolved.

There are still several notable lymph nodes including a 1.1 cm right
lower paratracheal lymph node which currently has a maximum SUV of
4.7 (formerly 3.1). In addition, there is plugging of the bronchus
intermedius with peribronchovascular thickening and density in the
right hilar and infrahilar region with associated branching
accentuated activity and maximum SUV along the right hilum of 5.2.
Previously there were more discrete lymph nodes at the right hilum
with maximum SUV of up to 7.8. Airway plugging in thickening extends
into both the right lower lobe and right middle lobe with faint
activity in the right lower lobe corresponding to interstitial
accentuation and mild ground-glass opacity.

There is likewise mildly accentuated metabolic activity associated
with considerable airway thickening and mild airway plugging in the
left lower lobe, maximum peribronchovascular SUV about 3.8 in this
vicinity. Indistinct ground-glass density nodule in the left upper
lobe on image 45/4, about 4 mm in diameter, maximum SUV 1.1.

Previous high uptake in the distal esophagus has resolved.

Incidental CT findings: Coronary, aortic arch, and branch vessel
atherosclerotic vascular disease. Left Port-A-Cath tip: SVC.

ABDOMEN/PELVIS: No significant abnormal hypermetabolic activity in
this region.

Incidental CT findings: Aortoiliac atherosclerotic vascular disease.
Aorta bi-iliac stent graft. Sigmoid colon diverticulosis. Mild
prostatomegaly. Small right adrenal adenoma, stable.

SKELETON: There is new hypermetabolic activity in the L1 vertebral
body, maximum SUV 6.3, previously 2.5. No appreciable collapse or
definite underlying CT lesion.

Incidental CT findings: Thoracic kyphosis. Grade 1 anterolisthesis
at L5-S1.
IMPRESSION: 1. New accentuated activity in the L1 vertebral body without
appreciable CT lesion. Strictly speaking, early metastatic disease
can not be excluded, although this would be an unusual pattern for
spread particularly in light of the general improvement in the
thoracic adenopathy. This could be investigated further with lumbar
MRI with and without contrast, if clinically warranted.
2. Adenopathy in the chest is improved. However, there is
considerable airway thickening and peribronchovascular density along
the bronchus intermedius, both lower lobe bronchi, and the right
middle lobe bronchus, with accentuated metabolic activity. This is
most likely from atypical infectious process, with entity such as
sarcoidosis or an unusual pattern of malignant spread a less likely
differential diagnostic considerations.
3. No findings of metastatic spread to the neck or abdomen/pelvis.
4. Other imaging findings of potential clinical significance: Aortic
Atherosclerosis ([id number]). Coronary atherosclerosis. Sigmoid
colon diverticulosis. Small right adrenal adenoma. Thoracic
kyphosis.

## 2019-12-04 ENCOUNTER — Other Ambulatory Visit (HOSPITAL_COMMUNITY): Payer: Self-pay

## 2019-12-04 DIAGNOSIS — C3490 Malignant neoplasm of unspecified part of unspecified bronchus or lung: Secondary | ICD-10-CM

## 2019-12-04 MED ORDER — LORAZEPAM 1 MG PO TABS: 1.0000 mg | ORAL_TABLET | Freq: Two times a day (BID) | ORAL | 3 refills | Status: DC

## 2019-12-29 DIAGNOSIS — E7849 Other hyperlipidemia: Secondary | ICD-10-CM | POA: Diagnosis not present

## 2019-12-29 DIAGNOSIS — N183 Chronic kidney disease, stage 3 unspecified: Secondary | ICD-10-CM | POA: Diagnosis not present

## 2019-12-29 DIAGNOSIS — E039 Hypothyroidism, unspecified: Secondary | ICD-10-CM | POA: Diagnosis not present

## 2019-12-29 DIAGNOSIS — E1165 Type 2 diabetes mellitus with hyperglycemia: Secondary | ICD-10-CM | POA: Diagnosis not present

## 2020-01-29 DIAGNOSIS — E7849 Other hyperlipidemia: Secondary | ICD-10-CM | POA: Diagnosis not present

## 2020-01-29 DIAGNOSIS — N183 Chronic kidney disease, stage 3 unspecified: Secondary | ICD-10-CM | POA: Diagnosis not present

## 2020-01-29 DIAGNOSIS — E1165 Type 2 diabetes mellitus with hyperglycemia: Secondary | ICD-10-CM | POA: Diagnosis not present

## 2020-02-28 ENCOUNTER — Inpatient Hospital Stay (HOSPITAL_COMMUNITY): Payer: Medicare Other | Attending: Hematology

## 2020-02-28 ENCOUNTER — Other Ambulatory Visit: Payer: Self-pay

## 2020-02-28 ENCOUNTER — Ambulatory Visit: Admission: RE | Admit: 2020-02-28 | Payer: Medicare Other | Source: Ambulatory Visit

## 2020-02-28 ENCOUNTER — Ambulatory Visit: Payer: Medicare Other

## 2020-02-28 DIAGNOSIS — C3490 Malignant neoplasm of unspecified part of unspecified bronchus or lung: Secondary | ICD-10-CM | POA: Insufficient documentation

## 2020-02-28 DIAGNOSIS — D696 Thrombocytopenia, unspecified: Secondary | ICD-10-CM | POA: Insufficient documentation

## 2020-02-28 DIAGNOSIS — F17290 Nicotine dependence, other tobacco product, uncomplicated: Secondary | ICD-10-CM | POA: Insufficient documentation

## 2020-02-28 DIAGNOSIS — E279 Disorder of adrenal gland, unspecified: Secondary | ICD-10-CM | POA: Diagnosis not present

## 2020-02-28 LAB — COMPREHENSIVE METABOLIC PANEL
ALT: 13 U/L (ref 0–44)
AST: 17 U/L (ref 15–41)
Albumin: 4.2 g/dL (ref 3.5–5.0)
Alkaline Phosphatase: 83 U/L (ref 38–126)
Anion gap: 10 (ref 5–15)
BUN: 23 mg/dL (ref 8–23)
CO2: 26 mmol/L (ref 22–32)
Calcium: 8.7 mg/dL — ABNORMAL LOW (ref 8.9–10.3)
Chloride: 99 mmol/L (ref 98–111)
Creatinine, Ser: 1.27 mg/dL — ABNORMAL HIGH (ref 0.61–1.24)
GFR, Estimated: 58 mL/min — ABNORMAL LOW (ref >60)
Glucose, Bld: 131 mg/dL — ABNORMAL HIGH (ref 70–99)
Potassium: 4.6 mmol/L (ref 3.5–5.1)
Sodium: 135 mmol/L (ref 135–145)
Total Bilirubin: 0.7 mg/dL (ref 0.3–1.2)
Total Protein: 8.2 g/dL — ABNORMAL HIGH (ref 6.5–8.1)

## 2020-02-28 LAB — CBC WITH DIFFERENTIAL/PLATELET
Abs Immature Granulocytes: 0.04 10*3/uL (ref 0.00–0.07)
Basophils Absolute: 0.1 10*3/uL (ref 0.0–0.1)
Basophils Relative: 1 %
Eosinophils Absolute: 0.2 10*3/uL (ref 0.0–0.5)
Eosinophils Relative: 2 %
HCT: 50.1 % (ref 39.0–52.0)
Hemoglobin: 16.5 g/dL (ref 13.0–17.0)
Immature Granulocytes: 1 %
Lymphocytes Relative: 19 %
Lymphs Abs: 1.5 10*3/uL (ref 0.7–4.0)
MCH: 31 pg (ref 26.0–34.0)
MCHC: 32.9 g/dL (ref 30.0–36.0)
MCV: 94 fL (ref 80.0–100.0)
Monocytes Absolute: 0.8 10*3/uL (ref 0.1–1.0)
Monocytes Relative: 10 %
Neutro Abs: 5.4 10*3/uL (ref 1.7–7.7)
Neutrophils Relative %: 67 %
Platelets: 156 10*3/uL (ref 150–400)
RBC: 5.33 MIL/uL (ref 4.22–5.81)
RDW: 13.6 % (ref 11.5–15.5)
WBC: 8.1 10*3/uL (ref 4.0–10.5)
nRBC: 0 % (ref 0.0–0.2)

## 2020-02-29 DIAGNOSIS — E7849 Other hyperlipidemia: Secondary | ICD-10-CM | POA: Diagnosis not present

## 2020-02-29 DIAGNOSIS — N183 Chronic kidney disease, stage 3 unspecified: Secondary | ICD-10-CM | POA: Diagnosis not present

## 2020-02-29 DIAGNOSIS — E1165 Type 2 diabetes mellitus with hyperglycemia: Secondary | ICD-10-CM | POA: Diagnosis not present

## 2020-03-06 ENCOUNTER — Ambulatory Visit (HOSPITAL_COMMUNITY): Payer: Medicare Other | Admitting: Hematology

## 2020-03-11 ENCOUNTER — Ambulatory Visit (HOSPITAL_COMMUNITY)
Admission: RE | Admit: 2020-03-11 | Discharge: 2020-03-11 | Disposition: A | Discharge status: Home / Self Care | Payer: Medicare Other | Source: Ambulatory Visit | Attending: Hematology | Admitting: Hematology

## 2020-03-11 ENCOUNTER — Other Ambulatory Visit: Payer: Self-pay

## 2020-03-11 DIAGNOSIS — C3490 Malignant neoplasm of unspecified part of unspecified bronchus or lung: Secondary | ICD-10-CM | POA: Diagnosis not present

## 2020-03-11 DIAGNOSIS — I6782 Cerebral ischemia: Secondary | ICD-10-CM | POA: Diagnosis not present

## 2020-03-11 MED ORDER — GADOBUTROL 1 MMOL/ML IV SOLN
7.0000 mL | Freq: Once | INTRAVENOUS | Status: AC | PRN
  Administered 2020-03-11: 7 mL via INTRAVENOUS

## 2020-03-18 ENCOUNTER — Other Ambulatory Visit: Payer: Self-pay

## 2020-03-18 ENCOUNTER — Ambulatory Visit (HOSPITAL_COMMUNITY)
Admission: RE | Admit: 2020-03-18 | Discharge: 2020-03-18 | Disposition: A | Discharge status: Home / Self Care | Payer: Medicare Other | Source: Ambulatory Visit | Attending: Hematology | Admitting: Hematology

## 2020-03-18 DIAGNOSIS — J432 Centrilobular emphysema: Secondary | ICD-10-CM | POA: Diagnosis not present

## 2020-03-18 DIAGNOSIS — C3490 Malignant neoplasm of unspecified part of unspecified bronchus or lung: Secondary | ICD-10-CM | POA: Diagnosis not present

## 2020-03-18 DIAGNOSIS — I2510 Atherosclerotic heart disease of native coronary artery without angina pectoris: Secondary | ICD-10-CM | POA: Diagnosis not present

## 2020-03-18 DIAGNOSIS — J984 Other disorders of lung: Secondary | ICD-10-CM | POA: Diagnosis not present

## 2020-03-18 MED ORDER — IOHEXOL 300 MG/ML  SOLN
75.0000 mL | Freq: Once | INTRAMUSCULAR | Status: AC | PRN
  Administered 2020-03-18: 75 mL via INTRAVENOUS

## 2020-03-24 ENCOUNTER — Encounter (HOSPITAL_COMMUNITY): Payer: Self-pay | Admitting: Hematology

## 2020-03-24 ENCOUNTER — Other Ambulatory Visit: Payer: Self-pay

## 2020-03-24 ENCOUNTER — Inpatient Hospital Stay (HOSPITAL_COMMUNITY): Payer: Medicare Other | Attending: Hematology | Admitting: Hematology

## 2020-03-24 VITALS — BP 149/80 | HR 84 | Temp 97.5°F | Resp 18 | Wt 178.1 lb

## 2020-03-24 DIAGNOSIS — F17210 Nicotine dependence, cigarettes, uncomplicated: Secondary | ICD-10-CM | POA: Insufficient documentation

## 2020-03-24 DIAGNOSIS — C3490 Malignant neoplasm of unspecified part of unspecified bronchus or lung: Secondary | ICD-10-CM | POA: Insufficient documentation

## 2020-03-24 DIAGNOSIS — E279 Disorder of adrenal gland, unspecified: Secondary | ICD-10-CM | POA: Insufficient documentation

## 2020-03-24 NOTE — Patient Instructions (Signed)
[place] at [facility] Discharge Instructions  You were seen today by Dr. Delton Coombes. He went over your recent results and scans. You will be scheduled for a CT scan of your chest before your next visit. Dr. Delton Coombes will see you back in 6 months for labs and follow up.   Thank you for choosing [place] at [facility] to provide your oncology and hematology care.  To afford each patient quality time with our provider, please arrive at least 15 minutes before your scheduled appointment time.   If you have a lab appointment with the [facility] please come in thru the Main Entrance and check in at the main information desk  You need to re-schedule your appointment should you arrive 10 or more minutes late.  We strive to give you quality time with our providers, and arriving late affects you and other patients whose appointments are after yours.  Also, if you no show three or more times for appointments you may be dismissed from the clinic at the providers discretion.     Again, thank you for choosing [facility].  Our hope is that these requests will decrease the amount of time that you wait before being seen by our physicians.       _____________________________________________________________  Should you have questions after your visit to [facility], please contact our office at [phone] between the hours of 8:00 a.m. and 4:30 p.m.  Voicemails left after 4:00 p.m. will not be returned until the following business day.  For prescription refill requests, have your pharmacy contact our office and allow 72 hours.    Cancer Center Support Programs:   > Cancer Support Group  2nd Tuesday of the month 1pm-2pm, Journey Room

## 2020-03-24 NOTE — Progress Notes (Signed)
[place], [place] [address]   CLINIC:  Medical Oncology/Hematology  PCP:  Rory Percy, MD [address] [place] [address] [phone]   REASON FOR VISIT:  Follow-up for limited stage small cell lung cancer  PRIOR THERAPY: Carboplatin and etoposide x 4 cycles from 01/18/2018 to 03/29/2018  NGS Results: Not done  CURRENT THERAPY: Observation  BRIEF ONCOLOGIC HISTORY:  Oncology History  Small cell lung cancer (Terry Boyd)  12/30/2017 Initial Diagnosis   Small cell lung cancer (Terry Boyd)   01/18/2018 - 03/29/2018 Chemotherapy   The patient had palonosetron (ALOXI) injection 0.25 mg, 0.25 mg, Intravenous,  Once, 4 of 4 cycles Administration: 0.25 mg (01/18/2018), 0.25 mg (02/08/2018), 0.25 mg (03/06/2018), 0.25 mg (03/27/2018) CARBOplatin (PARAPLATIN) 300 mg in sodium chloride 0.9 % 250 mL chemo infusion, 300 mg (100 % of original dose 300 mg), Intravenous,  Once, 4 of 4 cycles Dose modification: 300 mg (original dose 300 mg, Cycle 1), 409.5 mg (original dose 409.5 mg, Cycle 2),   (original dose 409.5 mg, Cycle 3), 409.5 mg (original dose 409.5 mg, Cycle 4) Administration: 300 mg (01/18/2018), 410 mg (02/08/2018), 410 mg (03/06/2018), 410 mg (03/27/2018) etoposide (VEPESID) 160 mg in sodium chloride 0.9 % 500 mL chemo infusion, 90 mg/m2 = 160 mg (90 % of original dose 100 mg/m2), Intravenous,  Once, 4 of 4 cycles Dose modification: 90 mg/m2 (90 % of original dose 100 mg/m2, Cycle 1, Reason: Patient Age) Administration: 160 mg (01/18/2018), 170 mg (01/19/2018), 170 mg (02/10/2018), 170 mg (02/08/2018), 170 mg (02/09/2018), 170 mg (01/20/2018), 170 mg (03/07/2018), 170 mg (03/06/2018), 170 mg (03/08/2018), 170 mg (03/27/2018), 170 mg (03/28/2018), 170 mg (03/29/2018) fosaprepitant (EMEND) 150 mg, dexamethasone (DECADRON) 12 mg in sodium chloride 0.9 % 145 mL IVPB, , Intravenous,  Once, 4 of 4 cycles Administration:  (01/18/2018),  (02/08/2018),  (03/06/2018),  (03/27/2018)   for chemotherapy treatment.      CANCER STAGING: Cancer Staging No matching staging information was found for the patient.  INTERVAL HISTORY:  Terry Boyd, a 80 y.o. male, returns for routine follow-up of his limited stage small cell lung cancer. Terry Boyd was last seen on 10/23/2019.   Today he reports feeling okay. His appetite is low, which is his baseline, and he usually eats 1 meal daily. He denies having any headaches, blurry vision or changes, leg swelling or gait issues. His chronic cough is constant.  He continues smoking less than 1 PPD.   REVIEW OF SYSTEMS:  Review of Systems  Constitutional: Positive for appetite change (25%) and fatigue (depleted).  Eyes: Negative for eye problems.  Respiratory: Positive for cough (chronic; stable) and shortness of breath.   Cardiovascular: Negative for leg swelling.  Musculoskeletal: Negative for gait problem.  Neurological: Negative for gait problem and headaches.  All other systems reviewed and are negative.   PAST MEDICAL/SURGICAL HISTORY:  Past Medical History:  Diagnosis Date  . AAA (abdominal aortic aneurysm) ([place])   . Alternating constipation and diarrhea   . Arthritis   . Chronic back pain   . COPD (chronic obstructive pulmonary disease) ([place])   . Depression   . GERD (gastroesophageal reflux disease)   . History of hiatal hernia   . Hypertension    not on medications  . Hypothyroidism   . Lung cancer ([facility]) 2019  . Peripheral vascular disease ([place])   . Smoker   . Thyroid disease    Past Surgical History:  Procedure Laterality Date  .  ABDOMINAL AORTAGRAM N/A 03/14/2012   Procedure: ABDOMINAL Maxcine Ham;  Surgeon: Serafina Mitchell, MD;  Location: [facility] CATH LAB;  Service: Cardiovascular;  Laterality: N/A;  . ABDOMINAL AORTIC ANEURYSM REPAIR  04-22-2009   Stent graft repair of AAA  . APPENDECTOMY    . BRONCHIAL BRUSHINGS Right 11/30/2017   Procedure: BRONCHIAL BRUSHINGS;  Surgeon: Sinda Du, MD;  [facility]  END[facility];  Service: Cardiopulmonary;  Laterality: Right;  . BRONCHIAL WASHINGS Right 11/30/2017   Procedure: BRONCHIAL WASHINGS;  Surgeon: Sinda Du, MD;  [facility];  Service: Cardiopulmonary;  Laterality: Right;  . BYPASS GRAFT POPLITEAL TO POPLITEAL  03/20/2012   Procedure: BYPASS GRAFT POPLITEAL TO POPLITEAL;  Surgeon: Elam Dutch, MD;  Location: [place];  Service: Vascular;  Laterality: Left;  ABOVE THE KNEE POPLITEAL ARTERY TO BELOW THE KNEE POPLITEAL ARTERY BYPASS GRAFT USING REVERSE LEFT GREATER SAPHENOUS VEIN   . ENDARTERECTOMY POPLITEAL  03/20/2012   Procedure: ENDARTERECTOMY POPLITEAL;  Surgeon: Elam Dutch, MD;  Location: [facility] OR;  Service: Vascular;  Laterality: Left;   LIGATION OF LEFT LEG POPLITEAL ANEURYSM  . ESOPHAGOGASTRODUODENOSCOPY  09/28/2017   Dr. Lurlean Nanny: Medium sized hiatal hernia, residual food.  Suspect gastroparesis.  Gastric atony.  . ESOPHAGOGASTRODUODENOSCOPY  01/05/2018   [place].  Large amount of solid food upon entry to the stomach.  Pylorus appeared patent.  Normal duodenal mucosa without evidence of obstruction to the second portion of duodenum.  Balloon dilation performed of the pylorus.  Marland Kitchen FLEXIBLE BRONCHOSCOPY N/A 11/30/2017   Procedure: FLEXIBLE BRONCHOSCOPY;  Surgeon: Sinda Du, MD;  [facility];  Service: Cardiopulmonary;  Laterality: N/A;  . INTRAOPERATIVE ARTERIOGRAM  03/20/2012   Procedure: INTRA OPERATIVE ARTERIOGRAM;  Surgeon: Elam Dutch, MD;  Location: [place];  Service: Vascular;  Laterality: Left;  TIMES ONE  . PORTACATH PLACEMENT Left 01/06/2018   Procedure: INSERTION PORT-A-CATH;  Surgeon: Aviva Signs, MD;  Location: [facility] ORS;  Service: General;  Laterality: Left;  Marland Kitchen VASECTOMY  1982  . VIDEO BRONCHOSCOPY WITH ENDOBRONCHIAL ULTRASOUND N/A 12/23/2017   Procedure: VIDEO BRONCHOSCOPY WITH ENDOBRONCHIAL ULTRASOUND;  Surgeon: Melrose Nakayama, MD;  Location: [place];  Service: Thoracic;   Laterality: N/A;    SOCIAL HISTORY:  Social History   Socioeconomic History  . Marital status: Divorced    Spouse name: Not on file  . Number of children: 2  . Years of education: Not on file  . Highest education level: Not on file  Occupational History  . Occupation: [place]     Comment: Agent Orange exposure  . Occupation: Sales executive    Comment: [place] exsposure  Tobacco Use  . Smoking status: Current Every Day Smoker    Years: 62.00    Types: Cigars  . Smokeless tobacco: Never Used  . Tobacco comment: pt states he smokes about 15 cigars per day  Vaping Use  . Vaping Use: Never used  Substance and Sexual Activity  . Alcohol use: No  . Drug use: No  . Sexual activity: Yes    Birth control/protection: None  Other Topics Concern  . Not on file  Social History Narrative   Sedentary   Social Determinants of Health   Financial Resource Strain: Low Risk   . Difficulty of Paying Living Expenses: Not very hard  Food Insecurity: No Food Insecurity  . Worried About Charity fundraiser in the Last Year: Never true  . Ran Out of Food in the Last Year: Never true  Transportation Needs: No Transportation Needs  . Lack of Transportation (Medical): No  . Lack of Transportation (Non-Medical): No  Physical Activity: Inactive  . Days of Exercise per Week: 0 days  . Minutes of Exercise per Session: 0 min  Stress: No Stress Concern Present  . Feeling of Stress : Not at all  Social Connections: Moderately Isolated  . Frequency of Communication with Friends and Family: Three times a week  . Frequency of Social Gatherings with Friends and Family: Twice a week  . Attends Religious Services: 1 to 4 times per year  . Active Member of Clubs or Organizations: No  . Attends Archivist Meetings: Never  . Marital Status: Divorced  Human resources officer Violence: Not At Risk  . Fear of Current or Ex-Partner: No  . Emotionally Abused: No  . Physically Abused: No  . Sexually  Abused: No    FAMILY HISTORY:  Family History  Problem Relation Age of Onset  . Diabetes Mother   . Stroke Mother   . Heart disease Father   . Diabetes Father   . Heart disease Sister        CABG x 4  . Cancer Brother        throat cancer  . Diabetes Sister   . Heart disease Sister   . Cancer Brother        pancreatic  . Prostate cancer Brother   . Kidney disease Son   . Colon cancer Neg Hx     CURRENT MEDICATIONS:  Current Outpatient Medications  Medication Sig Dispense Refill  . Ipratropium-Albuterol (COMBIVENT) 20-100 MCG/ACT AERS respimat Inhale 1 puff into the lungs every 6 (six) hours as needed for wheezing.    Marland Kitchen levothyroxine (SYNTHROID) 150 MCG tablet Take 150 mcg by mouth every morning.    Marland Kitchen LORazepam (ATIVAN) 1 MG tablet Take 1 tablet (1 mg total) by mouth 2 (two) times daily. 60 tablet 3  . sertraline (ZOLOFT) 50 MG tablet Take 50 mg by mouth daily.    . simvastatin (ZOCOR) 10 MG tablet Take 10 mg by mouth at bedtime.    . vitamin B-12 (CYANOCOBALAMIN) 250 MCG tablet Take 250 mcg by mouth daily.     No current facility-administered medications for this visit.    ALLERGIES:  Allergies  Allergen Reactions  . Morphine And Related Shortness Of Breath and Other (See Comments)    Hallucinations pt has taken hydromorphone before    PHYSICAL EXAM:  Performance status (ECOG): 1 - Symptomatic but completely ambulatory  Vitals:   03/24/20 1147  BP: (!) 149/80  Pulse: 84  Resp: 18  Temp: (!) 97.5 F (36.4 C)  SpO2: 93%   Wt Readings from Last 3 Encounters:  03/24/20 178 lb 1.6 oz (80.8 kg)  10/23/19 177 lb 11.2 oz (80.6 kg)  07/17/19 175 lb 1.6 oz (79.4 kg)   Physical Exam Vitals reviewed.  Constitutional:      Appearance: Normal appearance.  Cardiovascular:     Rate and Rhythm: Normal rate and regular rhythm.     Pulses: Normal pulses.     Heart sounds: Normal heart sounds.  Pulmonary:     Effort: Pulmonary effort is normal.     Breath sounds:  Normal breath sounds.  Musculoskeletal:     Right lower leg: No edema.     Left lower leg: No edema.  Neurological:     General: No focal deficit present.     Mental Status: He is alert and oriented  to person, place, and time.  Psychiatric:        Mood and Affect: Mood normal.        Behavior: Behavior normal.      LABORATORY DATA:  I have reviewed the labs as listed.  CBC Latest Ref Rng & Units 02/28/2020 10/16/2019 07/12/2019  WBC 4.0 - 10.5 K/uL 8.1 6.9 6.0  Hemoglobin 13.0 - 17.0 g/dL 16.5 15.1 14.8  Hematocrit 39.0 - 52.0 % 50.1 46.3 45.6  Platelets 150 - 400 K/uL 156 132(L) 149(L)   CMP Latest Ref Rng & Units 02/28/2020 10/16/2019 07/12/2019  Glucose 70 - 99 mg/dL 131(H) 120(H) 127(H)  BUN 8 - 23 mg/dL _0 Creatinine 0.61 - 1.24 mg/dL 1.27(H) 1.16 1.34(H)  Sodium 135 - 145 mmol/L 135 138 138  Potassium 3.5 - 5.1 mmol/L 4.6 4.3 4.7  Chloride 98 - 111 mmol/L 99 101 103  CO2 22 - 32 mmol/L _1 Calcium 8.9 - 10.3 mg/dL 8.7(L) 8.6(L) 8.5(L)  Total Protein 6.5 - 8.1 g/dL 8.2(H) 7.4 7.1  Total Bilirubin 0.3 - 1.2 mg/dL 0.7 0.5 0.4  Alkaline Phos 38 - 126 U/L 83 69 73  AST 15 - 41 U/L 17 14(L) 15  ALT 0 - 44 U/L _2 DIAGNOSTIC IMAGING:  I have independently reviewed the scans and discussed with the patient. CT Chest W Contrast  Result Date: 03/18/2020 CLINICAL DATA:  Small-cell lung cancer.  Restaging. EXAM: CT CHEST WITH CONTRAST TECHNIQUE: Multidetector CT imaging of the chest was performed during intravenous contrast administration. CONTRAST:  23m OMNIPAQUE IOHEXOL 300 MG/ML  SOLN COMPARISON:  10/16/2019 FINDINGS: Cardiovascular: The heart size is normal. No substantial pericardial effusion. Coronary artery calcification is evident. Atherosclerotic calcification is noted in the wall of the thoracic aorta. Mediastinum/Nodes: No mediastinal lymphadenopathy. 9 mm short axis right hilar node is stable. No left hilar lymphadenopathy. There is no axillary  lymphadenopathy. Lungs/Pleura: Centrilobular and paraseptal emphysema evident. Subpleural reticulation noted in the lungs bilaterally. Subpleural scarring along the minor fissure is stable. No new suspicious pulmonary nodule or mass. Stable scarring in the inferior lingula. No pleural effusion. Upper Abdomen: Stable 12 mm right adrenal nodule. Musculoskeletal: No worrisome lytic or sclerotic osseous abnormality. IMPRESSION: 1. Stable exam. No new or progressive findings to suggest recurrent or metastatic disease. 2. Stable 12 mm right adrenal nodule, likely adenoma but continued attention on follow-up recommended. 3. Aortic Atherosclerosis (ICD10-I70.0) and Emphysema (ICD10-J43.9). Electronically Signed   By: EMisty StanleyM.D.   On: 03/18/2020 12:19   MR Brain W Wo Contrast  Result Date: 03/11/2020 CLINICAL DATA:  Small cell lung cancer. EXAM: MRI HEAD WITHOUT AND WITH CONTRAST TECHNIQUE: Multiplanar, multiecho pulse sequences of the brain and surrounding structures were obtained without and with intravenous contrast. CONTRAST:  756mGADAVIST GADOBUTROL 1 MMOL/ML IV SOLN COMPARISON:  Head CT October 16, 2019. FINDINGS: Brain: No acute infarction, hemorrhage, hydrocephalus, extra-axial collection or mass lesion. Remote lacunar infarct in the left corona radiata. Scattered and confluent foci of T2 hyperintensity are seen within the white matter of the cerebral hemispheres and within the pons, non specific, most likely related to chronic small vessel ischemia. No focus of abnormal contrast enhancement. Vascular: Normal flow voids. Skull and upper cervical spine: Normal marrow signal. Sinuses/Orbits: Negative. IMPRESSION: 1. No evidence of intracranial metastatic disease. 2. Remote lacunar infarct in the left corona radiata. 3. Moderate chronic small vessel ischemia. Electronically Signed   By: KaJeanella Craze  Debbrah Alar M.D.   On: 03/11/2020 16:50     ASSESSMENT:  1. Limited stage small cell lung  cancer: -Chemoradiation therapy with carboplatin and etoposide 4 cycles from 01/18/2018 through 03/27/2018. -PCI was declined. -CT chest with contrast on 10/16/2019 did not show any residual or recurrent tumor or metastatic disease.  Stable right adrenal gland nodule. -CT head with and without contrast on 10/16/2019 was negative for brain meta stasis.   PLAN:  1. Limited stage small cell lung cancer: -He is continuing to smoke slightly less than 1 pack of cigarettes per day. -Denies any new onset chest pains. Chronic cough stable. -Reviewed CT chest from 03/18/2020 which did not show any evidence of recurrence. -Reviewed MRI brain from 03/11/2020 did not show any evidence of metastasis. -RTC 6 months with CT chest with contrast and labs.   Orders placed this encounter:  No orders of the defined types were placed in this encounter.    Derek Jack, MD [place] [phone]   I, Milinda Antis, am acting as a scribe for Dr. Sanda Linger.  I, Derek Jack MD, have reviewed the above documentation for accuracy and completeness, and I agree with the above.

## 2020-04-09 ENCOUNTER — Other Ambulatory Visit (HOSPITAL_COMMUNITY): Payer: Self-pay

## 2020-04-09 DIAGNOSIS — C3490 Malignant neoplasm of unspecified part of unspecified bronchus or lung: Secondary | ICD-10-CM

## 2020-04-09 MED ORDER — LORAZEPAM 1 MG PO TABS: 1.0000 mg | ORAL_TABLET | Freq: Two times a day (BID) | ORAL | 3 refills | Status: DC

## 2020-06-24 ENCOUNTER — Other Ambulatory Visit: Payer: Self-pay | Admitting: Cardiology

## 2020-06-24 ENCOUNTER — Ambulatory Visit (INDEPENDENT_AMBULATORY_CARE_PROVIDER_SITE_OTHER): Payer: Medicare Other

## 2020-06-24 ENCOUNTER — Ambulatory Visit: Payer: Medicare Other | Admitting: Cardiology

## 2020-06-24 ENCOUNTER — Other Ambulatory Visit: Payer: Self-pay

## 2020-06-24 ENCOUNTER — Encounter: Payer: Self-pay | Admitting: Cardiology

## 2020-06-24 VITALS — BP 132/86 | HR 80 | Ht 69.5 in | Wt 175.0 lb

## 2020-06-24 DIAGNOSIS — R55 Syncope and collapse: Secondary | ICD-10-CM

## 2020-06-24 DIAGNOSIS — Z87898 Personal history of other specified conditions: Secondary | ICD-10-CM

## 2020-06-24 DIAGNOSIS — E782 Mixed hyperlipidemia: Secondary | ICD-10-CM | POA: Diagnosis not present

## 2020-06-24 DIAGNOSIS — I739 Peripheral vascular disease, unspecified: Secondary | ICD-10-CM

## 2020-06-24 NOTE — Progress Notes (Signed)
Cardiology Office Note  Date: 06/24/2020   ID: TRACER GUTRIDGE, DOB 10-09-1940, MRN [id number]  PCP:  [facility], MD  Cardiologist:  Rozann Lesches, MD Electrophysiologist:  None   Chief Complaint  Patient presents with  . History of syncope    History of Present Illness: Terry Boyd is a 80 y.o. male referred for cardiology consultation by Dr. Jimmye Norman with [facility] for evaluation of syncope. He describes two separate events today.  The first event occurred when he was lying on his left side in his bed, turned to put his TV remote control on the bedside table, and apparently rolled out of bed and landed on the floor.  He remembers hitting the floor, had difficulty getting up after that and skinned his arms.  But it is not entirely clear that he actually passed out based on his recollection of the events.  More recently a second event happened in similar fashion, again while lying down and turning over, but also coughing at the time.  He does not report any sudden sense of dizziness or syncope while standing, no palpitations or chest pain.  He does report difficulty with his gait, sometimes feels unsteady when he is walking.  He has history of PAD including AAA status post EVAR in 2011 by Dr. Oneida Alar. He also had an above-knee to below-knee popliteal bypass in 2014 for popliteal artery aneurysm. This occluded in October 2016.  He did undergo remote cardiac testing in 2013 at which point he had mild LV dysfunction with LVEF 45 to 50%, and Myoview indicating a region of inferior scar without active ischemia.  Orthostatic measurements were made today.  Supine blood pressure 138/80 with heart rate 81, seated blood pressure 138/76 with heart rate 80, and standing blood pressure 130/78 with heart rate 92.  I personally reviewed his ECG which shows sinus rhythm with PAC, possible old inferior infarct pattern.   Past Medical History:  Diagnosis Date  . AAA (abdominal aortic  aneurysm) ([place])    EVAR in 2011 by Dr. Oneida Alar  . Arthritis   . Chronic back pain   . COPD (chronic obstructive pulmonary disease) ([place])   . Depression   . Essential hypertension   . GERD (gastroesophageal reflux disease)   . History of hiatal hernia   . Hypothyroidism   . Peripheral vascular disease ([place])   . Small cell lung cancer ([facility])    Carboplatin and etoposide x 4 cycles from 01/18/2018 to 03/29/2018    Past Surgical History:  Procedure Laterality Date  . ABDOMINAL AORTAGRAM N/A 03/14/2012   Procedure: ABDOMINAL Terry Boyd;  Surgeon: Serafina Mitchell, MD;  Location[facility] CATH LAB;  Service: Cardiovascular;  Laterality: N/A;  . ABDOMINAL AORTIC ANEURYSM REPAIR  04-22-2009   Stent graft repair of AAA  . APPENDECTOMY    . BRONCHIAL BRUSHINGS Right 11/30/2017   Procedure: BRONCHIAL BRUSHINGS;  Surgeon: Sinda Du, MD;  [facility];  Service: Cardiopulmonary;  Laterality: Right;  . BRONCHIAL WASHINGS Right 11/30/2017   Procedure: BRONCHIAL WASHINGS;  Surgeon: Sinda Du, MD;  [facility];  Service: Cardiopulmonary;  Laterality: Right;  . BYPASS GRAFT POPLITEAL TO POPLITEAL  03/20/2012   Procedure: BYPASS GRAFT POPLITEAL TO POPLITEAL;  Surgeon: Elam Dutch, MD;  Location: [place];  Service: Vascular;  Laterality: Left;  ABOVE THE KNEE POPLITEAL ARTERY TO BELOW THE KNEE POPLITEAL ARTERY BYPASS GRAFT USING REVERSE LEFT GREATER SAPHENOUS VEIN   . ENDARTERECTOMY POPLITEAL  03/20/2012  Procedure: ENDARTERECTOMY POPLITEAL;  Surgeon: Elam Dutch, MD;  Location: [facility] OR;  Service: Vascular;  Laterality: Left;   LIGATION OF LEFT LEG POPLITEAL ANEURYSM  . ESOPHAGOGASTRODUODENOSCOPY  09/28/2017   Dr. Lurlean Nanny: Medium sized hiatal hernia, residual food.  Suspect gastroparesis.  Gastric atony.  . ESOPHAGOGASTRODUODENOSCOPY  01/05/2018   [place].  Large amount of solid food upon entry to the stomach.  Pylorus appeared patent.  Normal duodenal mucosa without  evidence of obstruction to the second portion of duodenum.  Balloon dilation performed of the pylorus.  Marland Kitchen FLEXIBLE BRONCHOSCOPY N/A 11/30/2017   Procedure: FLEXIBLE BRONCHOSCOPY;  Surgeon: Sinda Du, MD;  [facility];  Service: Cardiopulmonary;  Laterality: N/A;  . INTRAOPERATIVE ARTERIOGRAM  03/20/2012   Procedure: INTRA OPERATIVE ARTERIOGRAM;  Surgeon: Elam Dutch, MD;  Location: [place];  Service: Vascular;  Laterality: Left;  TIMES ONE  . PORTACATH PLACEMENT Left 01/06/2018   Procedure: INSERTION PORT-A-CATH;  Surgeon: Aviva Signs, MD;  [facility];  Service: General;  Laterality: Left;  Marland Kitchen VASECTOMY  1982  . VIDEO BRONCHOSCOPY WITH ENDOBRONCHIAL ULTRASOUND N/A 12/23/2017   Procedure: VIDEO BRONCHOSCOPY WITH ENDOBRONCHIAL ULTRASOUND;  Surgeon: Melrose Nakayama, MD;  Location: [facility] OR;  Service: Thoracic;  Laterality: N/A;    Current Outpatient Medications  Medication Sig Dispense Refill  . Ipratropium-Albuterol (COMBIVENT) 20-100 MCG/ACT AERS respimat Inhale 1 puff into the lungs every 6 (six) hours as needed for wheezing.    Marland Kitchen levothyroxine (SYNTHROID) 137 MCG tablet Take 137 mcg by mouth every other day. Alternates with 150 mcg    . LORazepam (ATIVAN) 1 MG tablet Take 1 tablet (1 mg total) by mouth 2 (two) times daily. 60 tablet 3  . sertraline (ZOLOFT) 50 MG tablet Take 50 mg by mouth daily.    . simvastatin (ZOCOR) 10 MG tablet Take 10 mg by mouth at bedtime.    . vitamin B-12 (CYANOCOBALAMIN) 250 MCG tablet Take 250 mcg by mouth daily.     No current facility-administered medications for this visit.   Allergies:  Morphine and related   Social History: The patient  reports that he has been smoking cigars. He has smoked for the past 62.00 years. He has never used smokeless tobacco. He reports that he does not drink alcohol and does not use drugs.   Family History: The patient's family history includes Cancer in his brother and brother; Diabetes in his  father, mother, and sister; Heart disease in his father, sister, and sister; Kidney disease in his son; Prostate cancer in his brother; Stroke in his mother.   ROS: No palpitations or chest pain.  Physical Exam: VS:  BP 132/86   Pulse 80   Ht 5' 9.5" (1.765 m)   Wt 175 lb (79.4 kg)   SpO2 93%   BMI 25.47 kg/m , BMI Body mass index is 25.47 kg/m.  Wt Readings from Last 3 Encounters:  06/24/20 175 lb (79.4 kg)  03/24/20 178 lb 1.6 oz (80.8 kg)  10/23/19 177 lb 11.2 oz (80.6 kg)    General: Elderly male, appears comfortable at rest. HEENT: Conjunctiva and lids normal, wearing a mask. Neck: Supple, no elevated JVP or carotid bruits, no thyromegaly. Lungs: Clear to auscultation, nonlabored breathing at rest. Cardiac: Regular rate and rhythm, no S3, 2/6 systolic murmur, no pericardial rub. Abdomen: Soft, nontender, bowel sounds present. Extremities: No pitting edema, distal pulses 2+. Skin: Warm and dry. Musculoskeletal: No kyphosis. Neuropsychiatric: Alert and oriented x3, affect grossly appropriate.  ECG:  An ECG dated 11/29/2017 was personally reviewed today and demonstrated:  Normal sinus rhythm.  Recent Labwork: 02/28/2020: ALT 13; AST 17; BUN 23; Creatinine, Ser 1.27; Hemoglobin 16.5; Platelets 156; Potassium 4.6; Sodium 135  March 2022: TSH 0.303, hemoglobin 16.3, platelets 208, BUN 17, creatinine 1.19, potassium 4.8, AST 16, ALT 10  Other Studies Reviewed Today:  Echocardiogram 02/02/2012: - Left ventricle: Diffuse hypokinesis worse in the septum  and apex The cavity size was normal. Wall thickness was  increased in a pattern of mild LVH. Systolic function was  mildly reduced. The estimated ejection fraction was in the  range of 45% to 50%. Diffuse hypokinesis.  - Atrial septum: No defect or patent foramen ovale was  identified.   Scalp Level 02/11/2012: IMPRESSION:  Abnormal pharmacologic stress nuclear myocardial study revealing  inferior,  inferoseptal and inferoapical infarction without evidence  for ischemia. Mildly impaired left ventricular systolic function.  Other findings as noted. This constitutes a low risk preoperative  study.   Assessment and Plan:  1.  Possible syncope although not entirely clear that patient actually lost consciousness based on discussion today.  Two separate events as noted above, both occurred while supine and turning over in bed, the last one associated with a coughing spell.  He is not orthostatic today, does not describe any chest pain or palpitations, no syncope while standing.  He does report some gait instability.  Plan to obtain an echocardiogram to reevaluate cardiac structure and function, also a 72-hour Zio patch.  2.  PAD and history of AAA as discussed above.  He has not had recent follow-up with [facility].  3.  Mixed hyperlipidemia, on Zocor.  Medication Adjustments/Labs and Tests Ordered: Current medicines are reviewed at length with the patient today.  Concerns regarding medicines are outlined above.   Tests Ordered: Orders Placed This Encounter  Procedures  . EKG 12-Lead  . ECHOCARDIOGRAM COMPLETE    Medication Changes: No orders of the defined types were placed in this encounter.   Disposition:  Follow up test results.  Signed, Satira Sark, MD, [facility] 06/24/2020 11:14 AM    [facility] [facility] [address] [place], [place] [address] Phone: [phone]; Fax: [phone]

## 2020-06-24 NOTE — Patient Instructions (Signed)
Medication Instructions:  Your physician recommends that you continue on your current medications as directed. Please refer to the Current Medication list given to you today.  *If you need a refill on your cardiac medications before your next appointment, please call your pharmacy*   Lab Work: None today   If you have labs (blood work) drawn today and your tests are completely normal, you will receive your results only by: Marland Kitchen MyChart Message (if you have MyChart) OR . A paper copy in the mail If you have any lab test that is abnormal or we need to change your treatment, we will call you to review the results.   Testing/Procedures: Your physician has requested that you have an echocardiogram. Echocardiography is a painless test that uses sound waves to create images of your heart. It provides your doctor with information about the size and shape of your heart and how well your heart's chambers and valves are working. This procedure takes approximately one hour. There are no restrictions for this procedure.   ZIO XT- Long Term Monitor Instructions   Your physician has requested you wear your ZIO patch monitor____3___days.   This is a single patch monitor.  [employer] supplies one patch monitor per enrollment.  Additional stickers are not available.   Please do not apply patch if you will be having a Nuclear Stress Test, Echocardiogram, Cardiac CT, MRI, or Chest Xray during the time frame you would be wearing the monitor. The patch cannot be worn during these tests.  You cannot remove and re-apply the ZIO XT patch monitor.    Do not shower for the first 24 hours.  You may shower after the first 24 hours.   Press button if you feel a symptom. You will hear a small click.  Record Date, Time and Symptom in the Patient Log Book.   When you are ready to remove patch, follow instructions on last 2 pages of Patient Log Book.  Stick patch monitor onto last page of Patient Log Book.   Place  Patient Log Book in Platinum box.  Use locking tab on box and tape box closed securely.  The [employer] and [employer] has [employer] on it.  Please place in mailbox as soon as possible.  Your physician should have your test results approximately 7 days after the monitor has been mailed back to [facility].   Call [place] at [phone] if you have questions regarding your ZIO XT patch monitor.  Call them immediately if you see an orange light blinking on your monitor.   If your monitor falls off in less than 4 days contact our Monitor department at [phone].  If your monitor becomes loose or falls off after 4 days call [employer] at [phone] for suggestions on securing your monitor.     Follow-Up: We will call you after the echo and Zio monitor are back. We will then determine your follow up.

## 2020-06-27 DIAGNOSIS — R55 Syncope and collapse: Secondary | ICD-10-CM | POA: Diagnosis not present

## 2020-07-02 ENCOUNTER — Telehealth: Payer: Self-pay

## 2020-07-02 NOTE — Telephone Encounter (Signed)
Pt notified and voiced understanding. Pt did not have any questions or concerns regarding monitor report. Pt does not have appt scheduled for after Echo. Will send a message to schedulers to get appointment made

## 2020-07-02 NOTE — Telephone Encounter (Signed)
-----   Message from Satira Sark, MD sent at 07/02/2020  9:48 AM EDT ----- Results reviewed.  Cardiac monitor did not elucidate any clear causes of syncope, occasional PACs and rare PVCs were seen.  NSVT also noted but would not be expected to necessarily be symptomatic.  He also had some atrial fibrillation but this was less than 1% total rhythm burden.  This is something that should be discussed in terms of stroke prophylaxis and whether he would be a candidate for DOAC.  Echocardiogram is pending, please make sure that he has office follow-up after his echocardiogram.

## 2020-07-24 ENCOUNTER — Ambulatory Visit (HOSPITAL_COMMUNITY)
Admission: RE | Admit: 2020-07-24 | Discharge: 2020-07-24 | Disposition: A | Discharge status: Home / Self Care | Payer: Medicare Other | Source: Ambulatory Visit | Attending: Internal Medicine | Admitting: Internal Medicine

## 2020-07-24 ENCOUNTER — Other Ambulatory Visit: Payer: Self-pay

## 2020-07-24 DIAGNOSIS — R55 Syncope and collapse: Secondary | ICD-10-CM | POA: Diagnosis not present

## 2020-07-24 DIAGNOSIS — F17200 Nicotine dependence, unspecified, uncomplicated: Secondary | ICD-10-CM | POA: Diagnosis not present

## 2020-07-24 DIAGNOSIS — J449 Chronic obstructive pulmonary disease, unspecified: Secondary | ICD-10-CM | POA: Diagnosis not present

## 2020-07-24 DIAGNOSIS — Z87898 Personal history of other specified conditions: Secondary | ICD-10-CM

## 2020-07-24 DIAGNOSIS — C3490 Malignant neoplasm of unspecified part of unspecified bronchus or lung: Secondary | ICD-10-CM | POA: Insufficient documentation

## 2020-07-24 DIAGNOSIS — I714 Abdominal aortic aneurysm, without rupture: Secondary | ICD-10-CM | POA: Diagnosis not present

## 2020-07-24 DIAGNOSIS — I517 Cardiomegaly: Secondary | ICD-10-CM | POA: Insufficient documentation

## 2020-07-24 LAB — ECHOCARDIOGRAM COMPLETE
Area-P 1/2: 3.99 cm2
S' Lateral: 3.07 cm

## 2020-07-24 NOTE — Progress Notes (Signed)
*  PRELIMINARY RESULTS* Echocardiogram 2D Echocardiogram has been performed.  Leavy Cella 07/24/2020, 10:10 AM

## 2020-08-07 ENCOUNTER — Other Ambulatory Visit (HOSPITAL_COMMUNITY): Payer: Self-pay

## 2020-08-07 DIAGNOSIS — C3490 Malignant neoplasm of unspecified part of unspecified bronchus or lung: Secondary | ICD-10-CM

## 2020-08-07 MED ORDER — LORAZEPAM 1 MG PO TABS: 1.0000 mg | ORAL_TABLET | Freq: Two times a day (BID) | ORAL | 3 refills | Status: DC

## 2020-09-18 ENCOUNTER — Inpatient Hospital Stay (HOSPITAL_COMMUNITY): Payer: Medicare Other | Attending: Hematology

## 2020-09-18 ENCOUNTER — Ambulatory Visit (HOSPITAL_COMMUNITY)
Admission: RE | Admit: 2020-09-18 | Discharge: 2020-09-18 | Disposition: A | Discharge status: Home / Self Care | Payer: Medicare Other | Source: Ambulatory Visit | Attending: Hematology | Admitting: Hematology

## 2020-09-18 ENCOUNTER — Other Ambulatory Visit: Payer: Self-pay

## 2020-09-18 ENCOUNTER — Other Ambulatory Visit (HOSPITAL_COMMUNITY): Payer: Medicare Other

## 2020-09-18 DIAGNOSIS — F17210 Nicotine dependence, cigarettes, uncomplicated: Secondary | ICD-10-CM | POA: Diagnosis not present

## 2020-09-18 DIAGNOSIS — C3490 Malignant neoplasm of unspecified part of unspecified bronchus or lung: Secondary | ICD-10-CM | POA: Diagnosis not present

## 2020-09-18 LAB — CBC WITH DIFFERENTIAL/PLATELET
Abs Immature Granulocytes: 0.04 10*3/uL (ref 0.00–0.07)
Basophils Absolute: 0.1 10*3/uL (ref 0.0–0.1)
Basophils Relative: 1 %
Eosinophils Absolute: 0.4 10*3/uL (ref 0.0–0.5)
Eosinophils Relative: 6 %
HCT: 47.7 % (ref 39.0–52.0)
Hemoglobin: 15.2 g/dL (ref 13.0–17.0)
Immature Granulocytes: 1 %
Lymphocytes Relative: 21 %
Lymphs Abs: 1.5 10*3/uL (ref 0.7–4.0)
MCH: 30.6 pg (ref 26.0–34.0)
MCHC: 31.9 g/dL (ref 30.0–36.0)
MCV: 96 fL (ref 80.0–100.0)
Monocytes Absolute: 0.6 10*3/uL (ref 0.1–1.0)
Monocytes Relative: 9 %
Neutro Abs: 4.4 10*3/uL (ref 1.7–7.7)
Neutrophils Relative %: 62 %
Platelets: 141 10*3/uL — ABNORMAL LOW (ref 150–400)
RBC: 4.97 MIL/uL (ref 4.22–5.81)
RDW: 13.9 % (ref 11.5–15.5)
WBC: 7 10*3/uL (ref 4.0–10.5)
nRBC: 0 % (ref 0.0–0.2)

## 2020-09-18 LAB — COMPREHENSIVE METABOLIC PANEL
ALT: 11 U/L (ref 0–44)
AST: 15 U/L (ref 15–41)
Albumin: 3.9 g/dL (ref 3.5–5.0)
Alkaline Phosphatase: 96 U/L (ref 38–126)
Anion gap: 8 (ref 5–15)
BUN: 19 mg/dL (ref 8–23)
CO2: 28 mmol/L (ref 22–32)
Calcium: 9.2 mg/dL (ref 8.9–10.3)
Chloride: 100 mmol/L (ref 98–111)
Creatinine, Ser: 1.11 mg/dL (ref 0.61–1.24)
GFR, Estimated: >60 mL/min (ref >60)
Glucose, Bld: 132 mg/dL — ABNORMAL HIGH (ref 70–99)
Potassium: 4.6 mmol/L (ref 3.5–5.1)
Sodium: 136 mmol/L (ref 135–145)
Total Bilirubin: 0.5 mg/dL (ref 0.3–1.2)
Total Protein: 7.6 g/dL (ref 6.5–8.1)

## 2020-09-18 MED ORDER — IOHEXOL 300 MG/ML  SOLN
75.0000 mL | Freq: Once | INTRAMUSCULAR | Status: AC | PRN
  Administered 2020-09-18: 75 mL via INTRAVENOUS

## 2020-09-24 NOTE — Progress Notes (Signed)
Terry Boyd, [place] [address]   CLINIC:  Medical Oncology/Hematology  PCP:  Elizabethton Nation, MD [address] / [place] [address] [phone]   REASON FOR VISIT:  Follow-up for limited stage small cell lung cancer  PRIOR THERAPY: Carboplatin and etoposide x 4 cycles from 01/18/2018 to 03/29/2018  NGS Results: not done  CURRENT THERAPY: surveillance  BRIEF ONCOLOGIC HISTORY:  Oncology History  Small cell lung cancer ([place])  12/30/2017 Initial Diagnosis   Small cell lung cancer ([place])    01/18/2018 - 03/29/2018 Chemotherapy   The patient had palonosetron (ALOXI) injection 0.25 mg, 0.25 mg, Intravenous,  Once, 4 of 4 cycles Administration: 0.25 mg (01/18/2018), 0.25 mg (02/08/2018), 0.25 mg (03/06/2018), 0.25 mg (03/27/2018) CARBOplatin (PARAPLATIN) 300 mg in sodium chloride 0.9 % 250 mL chemo infusion, 300 mg (100 % of original dose 300 mg), Intravenous,  Once, 4 of 4 cycles Dose modification: 300 mg (original dose 300 mg, Cycle 1), 409.5 mg (original dose 409.5 mg, Cycle 2),   (original dose 409.5 mg, Cycle 3), 409.5 mg (original dose 409.5 mg, Cycle 4) Administration: 300 mg (01/18/2018), 410 mg (02/08/2018), 410 mg (03/06/2018), 410 mg (03/27/2018) etoposide (VEPESID) 160 mg in sodium chloride 0.9 % 500 mL chemo infusion, 90 mg/m2 = 160 mg (90 % of original dose 100 mg/m2), Intravenous,  Once, 4 of 4 cycles Dose modification: 90 mg/m2 (90 % of original dose 100 mg/m2, Cycle 1, Reason: Patient Age) Administration: 160 mg (01/18/2018), 170 mg (01/19/2018), 170 mg (02/10/2018), 170 mg (02/08/2018), 170 mg (02/09/2018), 170 mg (01/20/2018), 170 mg (03/07/2018), 170 mg (03/06/2018), 170 mg (03/08/2018), 170 mg (03/27/2018), 170 mg (03/28/2018), 170 mg (03/29/2018) fosaprepitant (EMEND) 150 mg, dexamethasone (DECADRON) 12 mg in sodium chloride 0.9 % 145 mL IVPB, , Intravenous,  Once, 4 of 4 cycles Administration:  (01/18/2018),  (02/08/2018),  (03/06/2018),   (03/27/2018)   for chemotherapy treatment.       CANCER STAGING: Cancer Staging No matching staging information was found for the patient.  INTERVAL HISTORY:  [name], a 80 y.o. male, returns for routine follow-up of his limited stage small cell lung cancer. Terry Boyd was last seen on 03/24/20.   Today he reports feeling fair. He reports low energy levels and fair appetite.   REVIEW OF SYSTEMS:  Review of Systems  Constitutional:  Positive for appetite change (50%) and fatigue (25%).  Respiratory:  Positive for cough and shortness of breath.   Genitourinary:  Positive for difficulty urinating (slow flow).   Neurological:  Positive for dizziness (occasional).  All other systems reviewed and are negative.  PAST MEDICAL/SURGICAL HISTORY:  Past Medical History:  Diagnosis Date   AAA (abdominal aortic aneurysm) ([place])    EVAR in 2011 by Dr. Oneida Alar   Arthritis    Chronic back pain    COPD (chronic obstructive pulmonary disease) ([place])    Depression    Essential hypertension    GERD (gastroesophageal reflux disease)    History of hiatal hernia    Hypothyroidism    Peripheral vascular disease ([facility])    Small cell lung cancer ([facility])    Carboplatin and etoposide x 4 cycles from 01/18/2018 to 03/29/2018   Past Surgical History:  Procedure Laterality Date   ABDOMINAL AORTAGRAM N/A 03/14/2012   Procedure: ABDOMINAL Maxcine Ham;  Surgeon: Serafina Mitchell, MD;  Location: [facility] CATH LAB;  Service: Cardiovascular;  Laterality: N/A;   ABDOMINAL AORTIC ANEURYSM REPAIR  04-22-2009  Stent graft repair of AAA   APPENDECTOMY     BRONCHIAL BRUSHINGS Right 11/30/2017   Procedure: BRONCHIAL BRUSHINGS;  Surgeon: Sinda Du, MD;  Location: [facility] ENDO SUITE;  Service: Cardiopulmonary;  Laterality: Right;   BRONCHIAL WASHINGS Right 11/30/2017   Procedure: BRONCHIAL WASHINGS;  Surgeon: Sinda Du, MD;  [facility];  Service: Cardiopulmonary;  Laterality: Right;   BYPASS GRAFT  POPLITEAL TO POPLITEAL  03/20/2012   Procedure: BYPASS GRAFT POPLITEAL TO POPLITEAL;  Surgeon: Elam Dutch, MD;  Location: [facility];  Service: Vascular;  Laterality: Left;  ABOVE THE KNEE POPLITEAL ARTERY TO BELOW THE KNEE POPLITEAL ARTERY BYPASS GRAFT USING REVERSE LEFT GREATER SAPHENOUS VEIN    ENDARTERECTOMY POPLITEAL  03/20/2012   Procedure: ENDARTERECTOMY POPLITEAL;  Surgeon: Elam Dutch, MD;  Location[facility];  Service: Vascular;  Laterality: Left;   LIGATION OF LEFT LEG POPLITEAL ANEURYSM   ESOPHAGOGASTRODUODENOSCOPY  09/28/2017   Dr. Lurlean Nanny: Medium sized hiatal hernia, residual food.  Suspect gastroparesis.  Gastric atony.   ESOPHAGOGASTRODUODENOSCOPY  01/05/2018   [place].  Large amount of solid food upon entry to the stomach.  Pylorus appeared patent.  Normal duodenal mucosa without evidence of obstruction to the second portion of duodenum.  Balloon dilation performed of the pylorus.   FLEXIBLE BRONCHOSCOPY N/A 11/30/2017   Procedure: FLEXIBLE BRONCHOSCOPY;  Surgeon: Sinda Du, MD;  [facility];  Service: Cardiopulmonary;  Laterality: N/A;   INTRAOPERATIVE ARTERIOGRAM  03/20/2012   Procedure: INTRA OPERATIVE ARTERIOGRAM;  Surgeon: Elam Dutch, MD;  Location: [facility];  Service: Vascular;  Laterality: Left;  TIMES ONE   PORTACATH PLACEMENT Left 01/06/2018   Procedure: INSERTION PORT-A-CATH;  Surgeon: Aviva Signs, MD;  Location: [facility] ORS;  Service: General;  Laterality: Left;   [place] N/A 12/23/2017   Procedure: VIDEO BRONCHOSCOPY WITH ENDOBRONCHIAL ULTRASOUND;  Surgeon: Melrose Nakayama, MD;  Location: [facility];  Service: Thoracic;  Laterality: N/A;    SOCIAL HISTORY:  Social History   Socioeconomic History   Marital status: Divorced    Spouse name: Not on file   Number of children: 2   Years of education: Not on file   Highest education level: Not on file  Occupational History    Occupation: [place]     Comment: [employer] exposure   Occupation: Sales executive    Comment: [employer] exsposure  Tobacco Use   Smoking status: Every Day    Types: Cigars   Smokeless tobacco: Never  Vaping Use   Vaping Use: Never used  Substance and Sexual Activity   Alcohol use: No   Drug use: No   Sexual activity: Not on file  Other Topics Concern   Not on file  Social History Narrative   Sedentary   Social Determinants of Health   Financial Resource Strain: Low Risk    Difficulty of Paying Living Expenses: Not very hard  Food Insecurity: No Food Insecurity   Worried About Charity fundraiser in the Last Year: Never true   Ran Out of Food in the Last Year: Never true  Transportation Needs: No Transportation Needs   Lack of Transportation (Medical): No   Lack of Transportation (Non-Medical): No  Physical Activity: Inactive   Days of Exercise per Week: 0 days   Minutes of Exercise per Session: 0 min  Stress: No Stress Concern Present   Feeling of Stress : Not at all  Social Connections: Moderately  Isolated   Frequency of Communication with Friends and Family: Three times a week   Frequency of Social Gatherings with Friends and Family: Twice a week   Attends Religious Services: 1 to 4 times per year   Active Member of [employer] or Organizations: No   Attends Music therapist: Never   Marital Status: Divorced  Human resources officer Violence: Not At Risk   Fear of Current or Ex-Partner: No   Emotionally Abused: No   Physically Abused: No   Sexually Abused: No    FAMILY HISTORY:  Family History  Problem Relation Age of Onset   Diabetes Mother    Stroke Mother    Heart disease Father    Diabetes Father    Heart disease Sister        CABG x 4   Cancer Brother        throat cancer   Diabetes Sister    Heart disease Sister    Cancer Brother        pancreatic   Prostate cancer Brother    Kidney disease Son    Colon cancer Neg Hx     CURRENT  MEDICATIONS:  Current Outpatient Medications  Medication Sig Dispense Refill   levothyroxine (SYNTHROID) 137 MCG tablet Take 137 mcg by mouth every other day. Alternates with 150 mcg     levothyroxine (SYNTHROID) 150 MCG tablet Take 150 mcg by mouth every other day.     LORazepam (ATIVAN) 1 MG tablet Take 1 tablet (1 mg total) by mouth 2 (two) times daily. 60 tablet 3   sertraline (ZOLOFT) 50 MG tablet Take 50 mg by mouth daily.     simvastatin (ZOCOR) 10 MG tablet Take 10 mg by mouth at bedtime.     Ipratropium-Albuterol (COMBIVENT) 20-100 MCG/ACT AERS respimat Inhale 1 puff into the lungs every 6 (six) hours as needed for wheezing. (Patient not taking: Reported on 09/25/2020)     No current facility-administered medications for this visit.    ALLERGIES:  Allergies  Allergen Reactions   Morphine And Related Shortness Of Breath and Other (See Comments)    Hallucinations pt has taken hydromorphone before    PHYSICAL EXAM:  Performance status (ECOG): 1 - Symptomatic but completely ambulatory  Vitals:   09/25/20 1437  BP: 132/76  Pulse: 84  Resp: 18  Temp: (!) 97 F (36.1 C)  SpO2: 91%   Wt Readings from Last 3 Encounters:  09/25/20 167 lb 8 oz (76 kg)  06/24/20 175 lb (79.4 kg)  03/24/20 178 lb 1.6 oz (80.8 kg)   Physical Exam Vitals reviewed.  Constitutional:      Appearance: Normal appearance.  Cardiovascular:     Rate and Rhythm: Normal rate and regular rhythm.     Pulses: Normal pulses.     Heart sounds: Normal heart sounds.  Pulmonary:     Effort: Pulmonary effort is normal.     Breath sounds: Normal breath sounds.  Neurological:     General: No focal deficit present.     Mental Status: He is alert and oriented to person, place, and time.  Psychiatric:        Mood and Affect: Mood normal.        Behavior: Behavior normal.     LABORATORY DATA:  I have reviewed the labs as listed.  CBC Latest Ref Rng & Units 09/18/2020 02/28/2020 10/16/2019  WBC 4.0 - 10.5  K/uL 7.0 8.1 6.9  Hemoglobin 13.0 - 17.0 g/dL 15.2 16.5 15.1  Hematocrit 39.0 - 52.0 % 47.7 50.1 46.3  Platelets 150 - 400 K/uL 141(L) 156 132(L)   CMP Latest Ref Rng & Units 09/18/2020 02/28/2020 10/16/2019  Glucose 70 - 99 mg/dL 132(H) 131(H) 120(H)  BUN 8 - 23 mg/dL _0 Creatinine 0.61 - 1.24 mg/dL 1.11 1.27(H) 1.16  Sodium 135 - 145 mmol/L 136 135 138  Potassium 3.5 - 5.1 mmol/L 4.6 4.6 4.3  Chloride 98 - 111 mmol/L 100 99 101  CO2 22 - 32 mmol/L _1 Calcium 8.9 - 10.3 mg/dL 9.2 8.7(L) 8.6(L)  Total Protein 6.5 - 8.1 g/dL 7.6 8.2(H) 7.4  Total Bilirubin 0.3 - 1.2 mg/dL 0.5 0.7 0.5  Alkaline Phos 38 - 126 U/L 96 83 69  AST 15 - 41 U/L 15 17 14(L)  ALT 0 - 44 U/L _2 DIAGNOSTIC IMAGING:  I have independently reviewed the scans and discussed with the patient. CT Chest W Contrast  Result Date: 09/19/2020 CLINICAL DATA:  Small cell lung cancer, assess treatment response. EXAM: CT CHEST WITH CONTRAST TECHNIQUE: Multidetector CT imaging of the chest was performed during intravenous contrast administration. CONTRAST:  65m OMNIPAQUE IOHEXOL 300 MG/ML  SOLN COMPARISON:  Chest CT 03/18/2020 and 10/16/2019. FINDINGS: Cardiovascular: Fairly extensive atherosclerosis of the aorta, great vessels and coronary arteries again noted. No acute vascular findings are identified. Left subclavian Port-A-Cath extends to the upper SVC. A small amount of anterior pericardial fluid or thickening is stable. The heart size is normal. Mediastinum/Nodes: There are no enlarged mediastinal, hilar or axillary lymph nodes.Stable soft tissue thickening around the central bronchi bilaterally. Stable mild esophageal distension with a mid esophageal air-fluid level. The thyroid gland and trachea appear unremarkable. Lungs/Pleura: No pleural effusion or pneumothorax. Stable mild centrilobular and paraseptal emphysema with diffuse central airway thickening and chronic radiation changes in the right perihilar  region. No suspicious pulmonary nodularity. Upper abdomen: Stable 12 mm right adrenal nodule. The visualized upper abdomen otherwise appears unremarkable. Musculoskeletal/Chest wall: There is no chest wall mass or suspicious osseous finding. Stable thoracic kyphosis and mild degenerative changes. IMPRESSION: 1. Stable chest CT without evidence of local recurrence or metastatic disease. 2. Stable radiation changes in the right perihilar region. 3. Stable small right adrenal nodule since at least 2019, consistent with an incidental adenoma. 4. Aortic Atherosclerosis (ICD10-I70.0) and Emphysema (ICD10-J43.9). Electronically Signed   By: WRichardean SaleM.D.   On: 09/19/2020 16:58     ASSESSMENT:  1.  Limited stage small cell lung cancer: -Chemoradiation therapy with carboplatin and etoposide 4 cycles from 01/18/2018 through 03/27/2018. -PCI was declined. -CT chest with contrast on 10/16/2019 did not show any residual or recurrent tumor or metastatic disease.  Stable right adrenal gland nodule. -CT head with and without contrast on 10/16/2019 was negative for brain meta stasis.   PLAN:  1.  Limited stage small cell lung cancer: - He is continuing to smoke 1 pack of cigarettes daily. - He reports some dyspnea on exertion.  No weight loss. - Reviewed his labs from 09/18/2020.  LFTs and CBC were within normal limits.  Reviewed his CT scan of the chest which showed stable examination with radiation changes in the right perihilar region also stable. - RTC 6 months with repeat CT scan of the chest.  We will also plan to repeat MRI of the brain with and without contrast prior to next visit.   Orders placed this encounter:  No orders of the defined types  were placed in this encounter.    Derek Jack, MD [place] [phone]   I, Thana Ates, am acting as a scribe for Dr. Derek Jack.  I, Derek Jack MD, have reviewed the above documentation for accuracy and  completeness, and I agree with the above.

## 2020-09-25 ENCOUNTER — Ambulatory Visit (HOSPITAL_COMMUNITY): Payer: Medicare Other | Admitting: Hematology

## 2020-09-25 ENCOUNTER — Inpatient Hospital Stay (HOSPITAL_COMMUNITY): Payer: Medicare Other | Admitting: Hematology

## 2020-09-25 ENCOUNTER — Other Ambulatory Visit: Payer: Self-pay

## 2020-09-25 VITALS — BP 132/76 | HR 84 | Temp 97.0°F | Resp 18 | Wt 167.5 lb

## 2020-09-25 DIAGNOSIS — C3490 Malignant neoplasm of unspecified part of unspecified bronchus or lung: Secondary | ICD-10-CM | POA: Diagnosis not present

## 2020-09-25 DIAGNOSIS — F17210 Nicotine dependence, cigarettes, uncomplicated: Secondary | ICD-10-CM

## 2020-09-25 NOTE — Patient Instructions (Addendum)
[place] at [facility] Discharge Instructions  You were seen today by Dr. Delton Coombes. He went over your recent results and scans. You will be scheduled for an MRI scan of your brain prior to your next appointment. Dr. Delton Coombes will see you back in 6 months for labs and follow up.   Thank you for choosing [place] at [facility] to provide your oncology and hematology care.  To afford each patient quality time with our provider, please arrive at least 15 minutes before your scheduled appointment time.   If you have a lab appointment with the [facility] please come in thru the Main Entrance and check in at the main information desk  You need to re-schedule your appointment should you arrive 10 or more minutes late.  We strive to give you quality time with our providers, and arriving late affects you and other patients whose appointments are after yours.  Also, if you no show three or more times for appointments you may be dismissed from the clinic at the providers discretion.     Again, thank you for choosing [facility].  Our hope is that these requests will decrease the amount of time that you wait before being seen by our physicians.       _____________________________________________________________  Should you have questions after your visit to [facility], please contact our office at [phone] between the hours of 8:00 a.m. and 4:30 p.m.  Voicemails left after 4:00 p.m. will not be returned until the following business day.  For prescription refill requests, have your pharmacy contact our office and allow 72 hours.    Cancer Center Support Programs:   > Cancer Support Group  2nd Tuesday of the month 1pm-2pm, Journey Room

## 2020-12-08 ENCOUNTER — Other Ambulatory Visit (HOSPITAL_COMMUNITY): Payer: Self-pay

## 2020-12-08 DIAGNOSIS — C3490 Malignant neoplasm of unspecified part of unspecified bronchus or lung: Secondary | ICD-10-CM

## 2020-12-08 MED ORDER — LORAZEPAM 1 MG PO TABS: 1.0000 mg | ORAL_TABLET | Freq: Two times a day (BID) | ORAL | 3 refills | Status: DC

## 2021-03-02 DIAGNOSIS — E039 Hypothyroidism, unspecified: Secondary | ICD-10-CM | POA: Diagnosis not present

## 2021-03-16 ENCOUNTER — Inpatient Hospital Stay (HOSPITAL_COMMUNITY): Payer: Medicare Other

## 2021-03-23 ENCOUNTER — Other Ambulatory Visit: Payer: Self-pay

## 2021-03-23 ENCOUNTER — Ambulatory Visit (HOSPITAL_COMMUNITY)
Admission: RE | Admit: 2021-03-23 | Discharge: 2021-03-23 | Disposition: A | Discharge status: Home / Self Care | Payer: Medicare Other | Source: Ambulatory Visit | Attending: Hematology | Admitting: Hematology

## 2021-03-23 ENCOUNTER — Inpatient Hospital Stay (HOSPITAL_COMMUNITY): Payer: Medicare Other | Attending: Hematology

## 2021-03-23 DIAGNOSIS — F17210 Nicotine dependence, cigarettes, uncomplicated: Secondary | ICD-10-CM | POA: Insufficient documentation

## 2021-03-23 DIAGNOSIS — R531 Weakness: Secondary | ICD-10-CM | POA: Diagnosis not present

## 2021-03-23 DIAGNOSIS — C3490 Malignant neoplasm of unspecified part of unspecified bronchus or lung: Secondary | ICD-10-CM

## 2021-03-23 DIAGNOSIS — Z9221 Personal history of antineoplastic chemotherapy: Secondary | ICD-10-CM | POA: Insufficient documentation

## 2021-03-23 DIAGNOSIS — E279 Disorder of adrenal gland, unspecified: Secondary | ICD-10-CM | POA: Insufficient documentation

## 2021-03-23 LAB — CBC WITH DIFFERENTIAL/PLATELET
Abs Immature Granulocytes: 0.03 10*3/uL (ref 0.00–0.07)
Basophils Absolute: 0.1 10*3/uL (ref 0.0–0.1)
Basophils Relative: 1 %
Eosinophils Absolute: 0.3 10*3/uL (ref 0.0–0.5)
Eosinophils Relative: 3 %
HCT: 46.1 % (ref 39.0–52.0)
Hemoglobin: 14.9 g/dL (ref 13.0–17.0)
Immature Granulocytes: 0 %
Lymphocytes Relative: 19 %
Lymphs Abs: 1.5 10*3/uL (ref 0.7–4.0)
MCH: 30.2 pg (ref 26.0–34.0)
MCHC: 32.3 g/dL (ref 30.0–36.0)
MCV: 93.5 fL (ref 80.0–100.0)
Monocytes Absolute: 0.5 10*3/uL (ref 0.1–1.0)
Monocytes Relative: 6 %
Neutro Abs: 5.5 10*3/uL (ref 1.7–7.7)
Neutrophils Relative %: 71 %
Platelets: 143 10*3/uL — ABNORMAL LOW (ref 150–400)
RBC: 4.93 MIL/uL (ref 4.22–5.81)
RDW: 13.3 % (ref 11.5–15.5)
WBC: 7.8 10*3/uL (ref 4.0–10.5)
nRBC: 0 % (ref 0.0–0.2)

## 2021-03-23 LAB — COMPREHENSIVE METABOLIC PANEL
ALT: 11 U/L (ref 0–44)
AST: 14 U/L — ABNORMAL LOW (ref 15–41)
Albumin: 4.1 g/dL (ref 3.5–5.0)
Alkaline Phosphatase: 76 U/L (ref 38–126)
Anion gap: 8 (ref 5–15)
BUN: 16 mg/dL (ref 8–23)
CO2: 28 mmol/L (ref 22–32)
Calcium: 8.5 mg/dL — ABNORMAL LOW (ref 8.9–10.3)
Chloride: 99 mmol/L (ref 98–111)
Creatinine, Ser: 1.05 mg/dL (ref 0.61–1.24)
GFR, Estimated: >60 mL/min (ref >60)
Glucose, Bld: 151 mg/dL — ABNORMAL HIGH (ref 70–99)
Potassium: 4 mmol/L (ref 3.5–5.1)
Sodium: 135 mmol/L (ref 135–145)
Total Bilirubin: 0.7 mg/dL (ref 0.3–1.2)
Total Protein: 7.4 g/dL (ref 6.5–8.1)

## 2021-03-23 MED ORDER — IOHEXOL 300 MG/ML  SOLN
100.0000 mL | Freq: Once | INTRAMUSCULAR | Status: AC | PRN
  Administered 2021-03-23: 75 mL via INTRAVENOUS

## 2021-03-23 MED ORDER — GADOBUTROL 1 MMOL/ML IV SOLN
7.0000 mL | Freq: Once | INTRAVENOUS | Status: AC | PRN
  Administered 2021-03-23: 7 mL via INTRAVENOUS

## 2021-03-26 ENCOUNTER — Other Ambulatory Visit: Payer: Self-pay

## 2021-03-26 ENCOUNTER — Inpatient Hospital Stay (HOSPITAL_BASED_OUTPATIENT_CLINIC_OR_DEPARTMENT_OTHER): Payer: Medicare Other | Admitting: Hematology

## 2021-03-26 VITALS — BP 144/74 | HR 50 | Temp 97.8°F | Resp 20 | Ht 67.72 in | Wt 165.2 lb

## 2021-03-26 DIAGNOSIS — F17210 Nicotine dependence, cigarettes, uncomplicated: Secondary | ICD-10-CM | POA: Diagnosis not present

## 2021-03-26 DIAGNOSIS — Z9221 Personal history of antineoplastic chemotherapy: Secondary | ICD-10-CM | POA: Diagnosis not present

## 2021-03-26 DIAGNOSIS — C3490 Malignant neoplasm of unspecified part of unspecified bronchus or lung: Secondary | ICD-10-CM | POA: Diagnosis not present

## 2021-03-26 DIAGNOSIS — E279 Disorder of adrenal gland, unspecified: Secondary | ICD-10-CM | POA: Diagnosis not present

## 2021-03-26 NOTE — Progress Notes (Signed)
Terry Boyd, Terry Boyd [id number]   CLINIC:  Medical Oncology/Hematology  PCP:  Philadelphia Nation, MD [address] / [place] [address] [phone]   REASON FOR VISIT:  Follow-up for limited stage small cell lung cancer  PRIOR THERAPY: Carboplatin and etoposide x 4 cycles from 01/18/2018 to 03/29/2018  NGS Results: not done  CURRENT THERAPY: surveillance  BRIEF ONCOLOGIC HISTORY:  Oncology History  Small cell lung cancer ([place])  12/30/2017 Initial Diagnosis   Small cell lung cancer ([place])   01/18/2018 - 03/29/2018 Chemotherapy   The patient had palonosetron (ALOXI) injection 0.25 mg, 0.25 mg, Intravenous,  Once, 4 of 4 cycles Administration: 0.25 mg (01/18/2018), 0.25 mg (02/08/2018), 0.25 mg (03/06/2018), 0.25 mg (03/27/2018) CARBOplatin (PARAPLATIN) 300 mg in sodium chloride 0.9 % 250 mL chemo infusion, 300 mg (100 % of original dose 300 mg), Intravenous,  Once, 4 of 4 cycles Dose modification: 300 mg (original dose 300 mg, Cycle 1), 409.5 mg (original dose 409.5 mg, Cycle 2),   (original dose 409.5 mg, Cycle 3), 409.5 mg (original dose 409.5 mg, Cycle 4) Administration: 300 mg (01/18/2018), 410 mg (02/08/2018), 410 mg (03/06/2018), 410 mg (03/27/2018) etoposide (VEPESID) 160 mg in sodium chloride 0.9 % 500 mL chemo infusion, 90 mg/m2 = 160 mg (90 % of original dose 100 mg/m2), Intravenous,  Once, 4 of 4 cycles Dose modification: 90 mg/m2 (90 % of original dose 100 mg/m2, Cycle 1, Reason: Patient Age) Administration: 160 mg (01/18/2018), 170 mg (01/19/2018), 170 mg (02/10/2018), 170 mg (02/08/2018), 170 mg (02/09/2018), 170 mg (01/20/2018), 170 mg (03/07/2018), 170 mg (03/06/2018), 170 mg (03/08/2018), 170 mg (03/27/2018), 170 mg (03/28/2018), 170 mg (03/29/2018) fosaprepitant (EMEND) 150 mg, dexamethasone (DECADRON) 12 mg in sodium chloride 0.9 % 145 mL IVPB, , Intravenous,  Once, 4 of 4 cycles Administration:  (01/18/2018),  (02/08/2018),  (03/06/2018),   (03/27/2018)   for chemotherapy treatment.       CANCER STAGING: Cancer Staging  No matching staging information was found for the patient.  INTERVAL HISTORY:  [name], a 81 y.o. male, returns for routine follow-up of his limited stage small cell lung cancer. Terry Boyd was last seen on 09/25/2020.   Today he reports feeling good. He denies recent infections and SOB. He continues to smoke 1 ppd. He has lost 2 lbs since 09/25/20. His appetite is good, and he denies ankle swellings.   REVIEW OF SYSTEMS:  Review of Systems  Constitutional:  Positive for fatigue. Negative for appetite change and unexpected weight change (-2 lbs).  Respiratory:  Positive for cough. Negative for shortness of breath.   Cardiovascular:  Negative for leg swelling.  Psychiatric/Behavioral:  Positive for sleep disturbance.   All other systems reviewed and are negative.  PAST MEDICAL/SURGICAL HISTORY:  Past Medical History:  Diagnosis Date   AAA (abdominal aortic aneurysm) ([place])    EVAR in 2011 by Dr. Oneida Alar   Arthritis    Chronic back pain    COPD (chronic obstructive pulmonary disease) ([facility])    Depression    Essential hypertension    GERD (gastroesophageal reflux disease)    History of hiatal hernia    Hypothyroidism    Peripheral vascular disease ([facility])    Small cell lung cancer ([facility])    Carboplatin and etoposide x 4 cycles from 01/18/2018 to 03/29/2018   Past Surgical History:  Procedure Laterality Date   ABDOMINAL AORTAGRAM N/A 03/14/2012   Procedure: ABDOMINAL Maxcine Ham;  Surgeon: Durene Fruits  Pierre Bali, MD;  Location: [facility];  Service: Cardiovascular;  Laterality: N/A;   ABDOMINAL AORTIC ANEURYSM REPAIR  04-22-2009   Stent graft repair of AAA   APPENDECTOMY     BRONCHIAL BRUSHINGS Right 11/30/2017   Procedure: BRONCHIAL BRUSHINGS;  Surgeon: Sinda Du, MD;  [facility];  Service: Cardiopulmonary;  Laterality: Right;   BRONCHIAL WASHINGS Right 11/30/2017   Procedure:  BRONCHIAL WASHINGS;  Surgeon: Sinda Du, MD;  [facility];  Service: Cardiopulmonary;  Laterality: Right;   BYPASS GRAFT POPLITEAL TO POPLITEAL  03/20/2012   Procedure: BYPASS GRAFT POPLITEAL TO POPLITEAL;  Surgeon: Elam Dutch, MD;  Location: [facility];  Service: Vascular;  Laterality: Left;  ABOVE THE KNEE POPLITEAL ARTERY TO BELOW THE KNEE POPLITEAL ARTERY BYPASS GRAFT USING REVERSE LEFT GREATER SAPHENOUS VEIN    ENDARTERECTOMY POPLITEAL  03/20/2012   Procedure: ENDARTERECTOMY POPLITEAL;  Surgeon: Elam Dutch, MD;  Location: [facility] OR;  Service: Vascular;  Laterality: Left;   LIGATION OF LEFT LEG POPLITEAL ANEURYSM   ESOPHAGOGASTRODUODENOSCOPY  09/28/2017   Dr. Lurlean Nanny: Medium sized hiatal hernia, residual food.  Suspect gastroparesis.  Gastric atony.   ESOPHAGOGASTRODUODENOSCOPY  01/05/2018   [facility].  Large amount of solid food upon entry to the stomach.  Pylorus appeared patent.  Normal duodenal mucosa without evidence of obstruction to the second portion of duodenum.  Balloon dilation performed of the pylorus.   FLEXIBLE BRONCHOSCOPY N/A 11/30/2017   Procedure: FLEXIBLE BRONCHOSCOPY;  Surgeon: Sinda Du, MD;  [facility];  Service: Cardiopulmonary;  Laterality: N/A;   INTRAOPERATIVE ARTERIOGRAM  03/20/2012   Procedure: INTRA OPERATIVE ARTERIOGRAM;  Surgeon: Elam Dutch, MD;  Location: [facility] OR;  Service: Vascular;  Laterality: Left;  TIMES ONE   PORTACATH PLACEMENT Left 01/06/2018   Procedure: INSERTION PORT-A-CATH;  Surgeon: Aviva Signs, MD;  Location: [facility];  Service: General;  Laterality: Left;   [place] N/A 12/23/2017   Procedure: VIDEO BRONCHOSCOPY WITH ENDOBRONCHIAL ULTRASOUND;  Surgeon: Melrose Nakayama, MD;  Location: [facility];  Service: Thoracic;  Laterality: N/A;    SOCIAL HISTORY:  Social History   Socioeconomic History   Marital status: Divorced    Spouse name: Not  on file   Number of children: 2   Years of education: Not on file   Highest education level: Not on file  Occupational History   Occupation: [place]     Comment: [employer] [place] exposure   Occupation: Sales executive    Comment: [employer] exsposure  Tobacco Use   Smoking status: Every Day    Types: Cigars   Smokeless tobacco: Never  Vaping Use   Vaping Use: Never used  Substance and Sexual Activity   Alcohol use: No   Drug use: No   Sexual activity: Not on file  Other Topics Concern   Not on file  Social History Narrative   Sedentary   Social Determinants of Health   Financial Resource Strain: Not on file  Food Insecurity: Not on file  Transportation Needs: Not on file  Physical Activity: Not on file  Stress: Not on file  Social Connections: Not on file  Intimate Partner Violence: Not on file    FAMILY HISTORY:  Family History  Problem Relation Age of Onset   Diabetes Mother    Stroke Mother    Heart disease Father    Diabetes Father    Heart disease Sister  CABG x 4   Cancer Brother        throat cancer   Diabetes Sister    Heart disease Sister    Cancer Brother        pancreatic   Prostate cancer Brother    Kidney disease Son    Colon cancer Neg Hx     CURRENT MEDICATIONS:  Current Outpatient Medications  Medication Sig Dispense Refill   Ipratropium-Albuterol (COMBIVENT) 20-100 MCG/ACT AERS respimat Inhale 1 puff into the lungs every 6 (six) hours as needed for wheezing.     levothyroxine (SYNTHROID) 137 MCG tablet Take 137 mcg by mouth every other day. Alternates with 150 mcg     LORazepam (ATIVAN) 1 MG tablet Take 1 tablet (1 mg total) by mouth 2 (two) times daily. 60 tablet 3   sertraline (ZOLOFT) 50 MG tablet Take 50 mg by mouth daily.     simvastatin (ZOCOR) 10 MG tablet Take 10 mg by mouth at bedtime.     No current facility-administered medications for this visit.    ALLERGIES:  Allergies  Allergen Reactions   Morphine And Related  Shortness Of Breath and Other (See Comments)    Hallucinations pt has taken hydromorphone before    PHYSICAL EXAM:  Performance status (ECOG): 1 - Symptomatic but completely ambulatory  Vitals:   03/26/21 1445  BP: (!) 144/74  Pulse: (!) 50  Resp: 20  Temp: 97.8 F (36.6 C)  SpO2: 93%   Wt Readings from Last 3 Encounters:  03/26/21 165 lb 3.2 oz (74.9 kg)  09/25/20 167 lb 8 oz (76 kg)  06/24/20 175 lb (79.4 kg)   Physical Exam Vitals reviewed.  Constitutional:      Appearance: Normal appearance.  Cardiovascular:     Rate and Rhythm: Normal rate and regular rhythm.     Pulses: Normal pulses.     Heart sounds: Normal heart sounds.  Pulmonary:     Effort: Pulmonary effort is normal.     Breath sounds: Normal breath sounds.  Neurological:     General: No focal deficit present.     Mental Status: He is alert and oriented to person, place, and time.  Psychiatric:        Mood and Affect: Mood normal.        Behavior: Behavior normal.     LABORATORY DATA:  I have reviewed the labs as listed.  CBC Latest Ref Rng & Units 03/23/2021 09/18/2020 02/28/2020  WBC 4.0 - 10.5 K/uL 7.8 7.0 8.1  Hemoglobin 13.0 - 17.0 g/dL 14.9 15.2 16.5  Hematocrit 39.0 - 52.0 % 46.1 47.7 50.1  Platelets 150 - 400 K/uL 143(L) 141(L) 156   CMP Latest Ref Rng & Units 03/23/2021 09/18/2020 02/28/2020  Glucose 70 - 99 mg/dL 151(H) 132(H) 131(H)  BUN 8 - 23 mg/dL _0 Creatinine 0.61 - 1.24 mg/dL 1.05 1.11 1.27(H)  Sodium 135 - 145 mmol/L 135 136 135  Potassium 3.5 - 5.1 mmol/L 4.0 4.6 4.6  Chloride 98 - 111 mmol/L 99 100 99  CO2 22 - 32 mmol/L _1 Calcium 8.9 - 10.3 mg/dL 8.5(L) 9.2 8.7(L)  Total Protein 6.5 - 8.1 g/dL 7.4 7.6 8.2(H)  Total Bilirubin 0.3 - 1.2 mg/dL 0.7 0.5 0.7  Alkaline Phos 38 - 126 U/L 76 96 83  AST 15 - 41 U/L 14(L) 15 17  ALT 0 - 44 U/L _2 DIAGNOSTIC IMAGING:  I have independently reviewed the  scans and discussed with the patient. CT Chest W  Contrast  Result Date: 03/24/2021 CLINICAL DATA:  81 year old male with history of small cell lung cancer status post chemotherapy. EXAM: CT CHEST WITH CONTRAST TECHNIQUE: Multidetector CT imaging of the chest was performed during intravenous contrast administration. RADIATION DOSE REDUCTION: This exam was performed according to the departmental dose-optimization program which includes automated exposure control, adjustment of the mA and/or kV according to patient size and/or use of iterative reconstruction technique. CONTRAST:  70m OMNIPAQUE IOHEXOL 300 MG/ML  SOLN COMPARISON:  Chest CT 09/18/2020. FINDINGS: Cardiovascular: Heart size is normal. There is no significant pericardial fluid, thickening or pericardial calcification. There is aortic atherosclerosis, as well as atherosclerosis of the great vessels of the mediastinum and the coronary arteries, including calcified atherosclerotic plaque in the left main, left anterior descending, left circumflex and right coronary arteries. Left-sided subclavian single-lumen porta cath with tip terminating in the mid superior vena cava. Mediastinum/Nodes: Prominent nodal tissue in the right hilar region measuring 1.1 cm in short axis, grossly similar to numerous prior examinations dating back to at least 04/06/2019, presumably benign. No pathologically enlarged mediastinal or hilar lymph nodes. Esophagus is unremarkable in appearance. No axillary lymphadenopathy. Lungs/Pleura: Chronic architectural distortion in the right perihilar region, similar to prior examinations, compatible with chronic postradiation changes. No suspicious appearing pulmonary nodules or masses are noted. No acute consolidative airspace disease. No pleural effusions. Diffuse bronchial wall thickening with mild to moderate centrilobular and paraseptal emphysema. Upper Abdomen: 1.3 x 0.9 cm right adrenal nodule, unchanged compared to prior examinations, favored to represent a benign lesion such as a  small adenoma. Incompletely imaged stent graft in the abdominal aorta. Aortic atherosclerosis. Musculoskeletal: There are no aggressive appearing lytic or blastic lesions noted in the visualized portions of the skeleton. IMPRESSION: 1. Stable examination without definitive evidence to suggest locally recurrent or metastatic disease in the chest. 2. Mild diffuse bronchial wall thickening with mild to moderate centrilobular and paraseptal emphysema; imaging findings suggestive of underlying COPD. 3. Aortic atherosclerosis, in addition to left main and three-vessel coronary artery disease. Assessment for potential risk factor modification, dietary therapy or pharmacologic therapy may be warranted, if clinically indicated. 4. Stable small right adrenal nodule, likely a small adenoma. Aortic Atherosclerosis (ICD10-I70.0) and Emphysema (ICD10-J43.9). Electronically Signed   By: DVinnie LangtonM.D.   On: 03/24/2021 08:33   MR Brain W Wo Contrast  Result Date: 03/23/2021 CLINICAL DATA:  History of lung cancer, weakness EXAM: MRI HEAD WITHOUT AND WITH CONTRAST TECHNIQUE: Multiplanar, multiecho pulse sequences of the brain and surrounding structures were obtained without and with intravenous contrast. CONTRAST:  781mGADAVIST GADOBUTROL 1 MMOL/ML IV SOLN COMPARISON:  MRI head 03/11/2020 FINDINGS: Study is somewhat limited due to motion. Brain: No acute infarction, hemorrhage, hydrocephalus, extra-axial collection or mass lesion. Hyperintense T2/FLAIR signal intensities throughout the periventricular and subcortical white matter, likely secondary to chronic microvascular ischemic changes. Small old infarct in the left frontal corona radiata. Vascular: Normal flow voids. Skull and upper cervical spine: Normal marrow signal. Sinuses/Orbits: Unremarkable. Other: None. IMPRESSION: 1. No evidence of intracranial metastatic disease. 2. Chronic microvascular ischemic changes and old small infarct in the left frontal corona  radiata. Electronically Signed   By: DeOfilia Neas.D.   On: 03/23/2021 14:59     ASSESSMENT:  1.  Limited stage small cell lung cancer: -Chemoradiation therapy with carboplatin and etoposide 4 cycles from 01/18/2018 through 03/27/2018. -PCI was declined. -CT chest with contrast on 10/16/2019 did not  show any residual or recurrent tumor or metastatic disease.  Stable right adrenal gland nodule. -CT head with and without contrast on 10/16/2019 was negative for brain meta stasis.   PLAN:  1.  Limited stage small cell lung cancer: - He is continuing to smoke 1 pack of cigarettes per day. - He reports some baseline dyspnea on exertion which is stable.  No weight loss. - No change in baseline cough or hemoptysis. - Reviewed CT chest with contrast from 03/24/2021 which showed no evidence of recurrence or metastatic disease.  Stable small right adrenal nodule likely small adenoma. - Labs from 03/23/2021 shows LFTs are normal.  CBC was grossly normal with mild thrombocytopenia stable. - Counseled to quit smoking.  Recommend follow-up in 6 months with repeat scan.   Orders placed this encounter:  No orders of the defined types were placed in this encounter.    Derek Jack, MD [facility] [phone]   I, Thana Ates, am acting as a scribe for Dr. Derek Jack.  I, Derek Jack MD, have reviewed the above documentation for accuracy and completeness, and I agree with the above.

## 2021-03-26 NOTE — Patient Instructions (Signed)
[place] at [facility] Discharge Instructions  You were seen and examined today by Dr. Delton Coombes. He reviewed your most recent labs and scan and everything looks stable. Please keep follow up appointment as scheduled in 6 months.   Thank you for choosing [place] at [facility] to provide your oncology and hematology care.  To afford each patient quality time with our provider, please arrive at least 15 minutes before your scheduled appointment time.   If you have a lab appointment with the [facility] please come in thru the Main Entrance and check in at the main information desk.  You need to re-schedule your appointment should you arrive 10 or more minutes late.  We strive to give you quality time with our providers, and arriving late affects you and other patients whose appointments are after yours.  Also, if you no show three or more times for appointments you may be dismissed from the clinic at the providers discretion.     Again, thank you for choosing [facility].  Our hope is that these requests will decrease the amount of time that you wait before being seen by our physicians.       _____________________________________________________________  Should you have questions after your visit to [facility], please contact our office at [phone] and follow the prompts.  Our office hours are 8:00 a.m. and 4:30 p.m. Monday - Friday.  Please note that voicemails left after 4:00 p.m. may not be returned until the following business day.  We are closed weekends and major holidays.  You do have access to a nurse 24-7, just call the main number to the clinic [phone] and do not press any options, hold on the line and a nurse will answer the phone.    For prescription refill requests, have your pharmacy contact our office and allow 72 hours.    Due to Covid, you will need to wear a mask upon entering the hospital.  If you do not have a mask, a mask will be given to you at the Main Entrance upon arrival. For doctor visits, patients may have 1 support person age 76 or older with them. For treatment visits, patients can not have anyone with them due to social distancing guidelines and our immunocompromised population.

## 2021-04-09 ENCOUNTER — Other Ambulatory Visit (HOSPITAL_COMMUNITY): Payer: Self-pay

## 2021-04-09 DIAGNOSIS — C3490 Malignant neoplasm of unspecified part of unspecified bronchus or lung: Secondary | ICD-10-CM

## 2021-04-10 MED ORDER — LORAZEPAM 1 MG PO TABS: 1.0000 mg | ORAL_TABLET | Freq: Two times a day (BID) | ORAL | 3 refills | Status: DC

## 2021-08-05 ENCOUNTER — Other Ambulatory Visit (HOSPITAL_COMMUNITY): Payer: Self-pay

## 2021-08-05 DIAGNOSIS — C3490 Malignant neoplasm of unspecified part of unspecified bronchus or lung: Secondary | ICD-10-CM

## 2021-08-05 MED ORDER — LORAZEPAM 1 MG PO TABS: 1.0000 mg | ORAL_TABLET | Freq: Two times a day (BID) | ORAL | 3 refills | Status: DC

## 2021-09-23 ENCOUNTER — Inpatient Hospital Stay (HOSPITAL_COMMUNITY): Payer: Medicare Other | Attending: Hematology

## 2021-09-23 ENCOUNTER — Ambulatory Visit (HOSPITAL_COMMUNITY)
Admission: RE | Admit: 2021-09-23 | Discharge: 2021-09-23 | Disposition: A | Discharge status: Home / Self Care | Payer: Medicare Other | Source: Ambulatory Visit | Attending: Hematology | Admitting: Hematology

## 2021-09-23 DIAGNOSIS — C3490 Malignant neoplasm of unspecified part of unspecified bronchus or lung: Secondary | ICD-10-CM | POA: Insufficient documentation

## 2021-09-23 LAB — CBC WITH DIFFERENTIAL/PLATELET
Abs Immature Granulocytes: 0.04 10*3/uL (ref 0.00–0.07)
Basophils Absolute: 0.1 10*3/uL (ref 0.0–0.1)
Basophils Relative: 1 %
Eosinophils Absolute: 0.3 10*3/uL (ref 0.0–0.5)
Eosinophils Relative: 4 %
HCT: 46.2 % (ref 39.0–52.0)
Hemoglobin: 14.6 g/dL (ref 13.0–17.0)
Immature Granulocytes: 1 %
Lymphocytes Relative: 19 %
Lymphs Abs: 1.5 10*3/uL (ref 0.7–4.0)
MCH: 30.1 pg (ref 26.0–34.0)
MCHC: 31.6 g/dL (ref 30.0–36.0)
MCV: 95.3 fL (ref 80.0–100.0)
Monocytes Absolute: 0.5 10*3/uL (ref 0.1–1.0)
Monocytes Relative: 6 %
Neutro Abs: 5.3 10*3/uL (ref 1.7–7.7)
Neutrophils Relative %: 69 %
Platelets: 149 10*3/uL — ABNORMAL LOW (ref 150–400)
RBC: 4.85 MIL/uL (ref 4.22–5.81)
RDW: 14 % (ref 11.5–15.5)
WBC: 7.7 10*3/uL (ref 4.0–10.5)
nRBC: 0 % (ref 0.0–0.2)

## 2021-09-23 LAB — COMPREHENSIVE METABOLIC PANEL
ALT: 13 U/L (ref 0–44)
AST: 17 U/L (ref 15–41)
Albumin: 4.1 g/dL (ref 3.5–5.0)
Alkaline Phosphatase: 66 U/L (ref 38–126)
Anion gap: 9 (ref 5–15)
BUN: 12 mg/dL (ref 8–23)
CO2: 29 mmol/L (ref 22–32)
Calcium: 8.9 mg/dL (ref 8.9–10.3)
Chloride: 100 mmol/L (ref 98–111)
Creatinine, Ser: 1.13 mg/dL (ref 0.61–1.24)
GFR, Estimated: >60 mL/min (ref >60)
Glucose, Bld: 141 mg/dL — ABNORMAL HIGH (ref 70–99)
Potassium: 4.7 mmol/L (ref 3.5–5.1)
Sodium: 138 mmol/L (ref 135–145)
Total Bilirubin: 0.8 mg/dL (ref 0.3–1.2)
Total Protein: 7.7 g/dL (ref 6.5–8.1)

## 2021-09-23 MED ORDER — IOHEXOL 300 MG/ML  SOLN
100.0000 mL | Freq: Once | INTRAMUSCULAR | Status: AC | PRN
  Administered 2021-09-23: 75 mL via INTRAVENOUS

## 2021-09-30 ENCOUNTER — Other Ambulatory Visit: Payer: Self-pay | Admitting: *Deleted

## 2021-09-30 ENCOUNTER — Inpatient Hospital Stay: Payer: Medicare Other | Attending: Hematology | Admitting: Hematology

## 2021-09-30 VITALS — BP 159/83 | HR 72 | Temp 97.8°F | Resp 18 | Ht 67.72 in | Wt 159.2 lb

## 2021-09-30 DIAGNOSIS — C3490 Malignant neoplasm of unspecified part of unspecified bronchus or lung: Secondary | ICD-10-CM

## 2021-09-30 DIAGNOSIS — Z79899 Other long term (current) drug therapy: Secondary | ICD-10-CM | POA: Insufficient documentation

## 2021-09-30 DIAGNOSIS — F17210 Nicotine dependence, cigarettes, uncomplicated: Secondary | ICD-10-CM | POA: Diagnosis not present

## 2021-09-30 NOTE — Progress Notes (Signed)
[place], [place] [address]   CLINIC:  Medical Oncology/Hematology  PCP:  June[place] Nation, MD [address] / [place] [address] [phone]   REASON FOR VISIT:  Follow-up for limited stage small cell lung cancer  PRIOR THERAPY: Carboplatin and etoposide x 4 cycles from 01/18/2018 to 03/29/2018  NGS Results: not done  CURRENT THERAPY: surveillance  BRIEF ONCOLOGIC HISTORY:  Oncology History  Small cell lung cancer ([place])  12/30/2017 Initial Diagnosis   Small cell lung cancer ([place])   01/18/2018 - 03/29/2018 Chemotherapy   The patient had palonosetron (ALOXI) injection 0.25 mg, 0.25 mg, Intravenous,  Once, 4 of 4 cycles Administration: 0.25 mg (01/18/2018), 0.25 mg (02/08/2018), 0.25 mg (03/06/2018), 0.25 mg (03/27/2018) CARBOplatin (PARAPLATIN) 300 mg in sodium chloride 0.9 % 250 mL chemo infusion, 300 mg (100 % of original dose 300 mg), Intravenous,  Once, 4 of 4 cycles Dose modification: 300 mg (original dose 300 mg, Cycle 1), 409.5 mg (original dose 409.5 mg, Cycle 2),   (original dose 409.5 mg, Cycle 3), 409.5 mg (original dose 409.5 mg, Cycle 4) Administration: 300 mg (01/18/2018), 410 mg (02/08/2018), 410 mg (03/06/2018), 410 mg (03/27/2018) etoposide (VEPESID) 160 mg in sodium chloride 0.9 % 500 mL chemo infusion, 90 mg/m2 = 160 mg (90 % of original dose 100 mg/m2), Intravenous,  Once, 4 of 4 cycles Dose modification: 90 mg/m2 (90 % of original dose 100 mg/m2, Cycle 1, Reason: Patient Age) Administration: 160 mg (01/18/2018), 170 mg (01/19/2018), 170 mg (02/10/2018), 170 mg (02/08/2018), 170 mg (02/09/2018), 170 mg (01/20/2018), 170 mg (03/07/2018), 170 mg (03/06/2018), 170 mg (03/08/2018), 170 mg (03/27/2018), 170 mg (03/28/2018), 170 mg (03/29/2018) fosaprepitant (EMEND) 150 mg, dexamethasone (DECADRON) 12 mg in sodium chloride 0.9 % 145 mL IVPB, , Intravenous,  Once, 4 of 4 cycles Administration:  (01/18/2018),  (02/08/2018),  (03/06/2018),   (03/27/2018)  for chemotherapy treatment.      CANCER STAGING: Cancer Staging  No matching staging information was found for the patient.  INTERVAL HISTORY:  [name], a 81 y.o. male, returns for routine follow-up of his limited stage small cell lung cancer. [name] was last seen on 03/26/2021.   Today he reports feeling good. He is smoking 1/2 ppd. He has lost 6 lbs since his last visit. His cough is stable and he denies SOB.   REVIEW OF SYSTEMS:  Review of Systems  Constitutional:  Negative for appetite change and fatigue.  Respiratory:  Positive for cough (stable) and shortness of breath (stable).   All other systems reviewed and are negative.   PAST MEDICAL/SURGICAL HISTORY:  Past Medical History:  Diagnosis Date   AAA (abdominal aortic aneurysm) ([place])    EVAR in 2011 by Dr. Oneida Alar   Arthritis    Chronic back pain    COPD (chronic obstructive pulmonary disease) ([place])    Depression    Essential hypertension    GERD (gastroesophageal reflux disease)    History of hiatal hernia    Hypothyroidism    Peripheral vascular disease (HCC)    Small cell lung cancer (HCC)    Carboplatin and etoposide x 4 cycles from 01/18/2018 to 03/29/2018   Past Surgical History:  Procedure Laterality Date   ABDOMINAL AORTAGRAM N/A 03/14/2012   Procedure: ABDOMINAL Maxcine Ham;  Surgeon: Serafina Mitchell, MD;  Location: [facility] CATH LAB;  Service: Cardiovascular;  Laterality: N/A;   ABDOMINAL AORTIC ANEURYSM REPAIR  04-22-2009   Stent graft repair of  AAA   APPENDECTOMY     BRONCHIAL BRUSHINGS Right 11/30/2017   Procedure: BRONCHIAL BRUSHINGS;  Surgeon: Sinda Du, MD;  [facility];  Service: Cardiopulmonary;  Laterality: Right;   BRONCHIAL WASHINGS Right 11/30/2017   Procedure: BRONCHIAL WASHINGS;  Surgeon: Sinda Du, MD;  [facility];  Service: Cardiopulmonary;  Laterality: Right;   BYPASS GRAFT POPLITEAL TO POPLITEAL  03/20/2012   Procedure: BYPASS GRAFT  POPLITEAL TO POPLITEAL;  Surgeon: Elam Dutch, MD;  Location: [facility];  Service: Vascular;  Laterality: Left;  ABOVE THE KNEE POPLITEAL ARTERY TO BELOW THE KNEE POPLITEAL ARTERY BYPASS GRAFT USING REVERSE LEFT GREATER SAPHENOUS VEIN    ENDARTERECTOMY POPLITEAL  03/20/2012   Procedure: ENDARTERECTOMY POPLITEAL;  Surgeon: Elam Dutch, MD;  [facility] OR;  Service: Vascular;  Laterality: Left;   LIGATION OF LEFT LEG POPLITEAL ANEURYSM   ESOPHAGOGASTRODUODENOSCOPY  09/28/2017   Dr. Lurlean Nanny: Medium sized hiatal hernia, residual food.  Suspect gastroparesis.  Gastric atony.   ESOPHAGOGASTRODUODENOSCOPY  01/05/2018   [place].  Large amount of solid food upon entry to the stomach.  Pylorus appeared patent.  Normal duodenal mucosa without evidence of obstruction to the second portion of duodenum.  Balloon dilation performed of the pylorus.   FLEXIBLE BRONCHOSCOPY N/A 11/30/2017   Procedure: FLEXIBLE BRONCHOSCOPY;  Surgeon: Sinda Du, MD;  [facility];  Service: Cardiopulmonary;  Laterality: N/A;   INTRAOPERATIVE ARTERIOGRAM  03/20/2012   Procedure: INTRA OPERATIVE ARTERIOGRAM;  Surgeon: Elam Dutch, MD;  Location: [facility];  Service: Vascular;  Laterality: Left;  TIMES ONE   PORTACATH PLACEMENT Left 01/06/2018   Procedure: INSERTION PORT-A-CATH;  Surgeon: Aviva Signs, MD;  Location: [facility] ORS;  Service: General;  Laterality: Left;   [place] N/A 12/23/2017   Procedure: VIDEO BRONCHOSCOPY WITH ENDOBRONCHIAL ULTRASOUND;  Surgeon: Melrose Nakayama, MD;  Location: [facility];  Service: Thoracic;  Laterality: N/A;    SOCIAL HISTORY:  Social History   Socioeconomic History   Marital status: Divorced    Spouse name: Not on file   Number of children: 2   Years of education: Not on file   Highest education level: Not on file  Occupational History   Occupation: [place]     Comment: [place] [employer] exposure    Occupation: Sales executive    Comment: [place] exsposure  Tobacco Use   Smoking status: Every Day    Types: Cigars   Smokeless tobacco: Never  Vaping Use   Vaping Use: Never used  Substance and Sexual Activity   Alcohol use: No   Drug use: No   Sexual activity: Not on file  Other Topics Concern   Not on file  Social History Narrative   Sedentary   Social Determinants of Health   Financial Resource Strain: Low Risk  (03/24/2020)   Overall Financial Resource Strain (CARDIA)    Difficulty of Paying Living Expenses: Not very hard  Food Insecurity: No Food Insecurity (03/24/2020)   Hunger Vital Sign    Worried About Running Out of Food in the Last Year: Never true    Ran Out of Food in the Last Year: Never true  Transportation Needs: No Transportation Needs (03/24/2020)   PRAPARE - Hydrologist (Medical): No    Lack of Transportation (Non-Medical): No  Physical Activity: Inactive (03/24/2020)   Exercise Vital Sign    Days of Exercise per Week: 0 days  Minutes of Exercise per Session: 0 min  Stress: No Stress Concern Present (03/24/2020)   [place]    Feeling of Stress : Not at all  Social Connections: Moderately Isolated (03/24/2020)   Social Connection and Isolation Panel [NHANES]    Frequency of Communication with Friends and Family: Three times a week    Frequency of Social Gatherings with Friends and Family: Twice a week    Attends Religious Services: 1 to 4 times per year    Active Member of [employer] or Organizations: No    Attends Archivist Meetings: Never    Marital Status: Divorced  Human resources officer Violence: Not At Risk (03/24/2020)   Humiliation, Afraid, Rape, and Kick questionnaire    Fear of Current or Ex-Partner: No    Emotionally Abused: No    Physically Abused: No    Sexually Abused: No    FAMILY HISTORY:  Family History  Problem Relation Age of  Onset   Diabetes Mother    Stroke Mother    Heart disease Father    Diabetes Father    Heart disease Sister        CABG x 4   Cancer Brother        throat cancer   Diabetes Sister    Heart disease Sister    Cancer Brother        pancreatic   Prostate cancer Brother    Kidney disease Son    Colon cancer Neg Hx     CURRENT MEDICATIONS:  Current Outpatient Medications  Medication Sig Dispense Refill   Ipratropium-Albuterol (COMBIVENT) 20-100 MCG/ACT AERS respimat Inhale 1 puff into the lungs every 6 (six) hours as needed for wheezing.     levothyroxine (SYNTHROID) 137 MCG tablet Take 137 mcg by mouth every other day. Alternates with 150 mcg     LORazepam (ATIVAN) 1 MG tablet Take 1 tablet (1 mg total) by mouth 2 (two) times daily. 60 tablet 3   sertraline (ZOLOFT) 50 MG tablet Take 50 mg by mouth daily.     simvastatin (ZOCOR) 10 MG tablet Take 10 mg by mouth at bedtime.     No current facility-administered medications for this visit.    ALLERGIES:  Allergies  Allergen Reactions   Morphine And Related Shortness Of Breath and Other (See Comments)    Hallucinations pt has taken hydromorphone before    PHYSICAL EXAM:  Performance status (ECOG): 1 - Symptomatic but completely ambulatory  There were no vitals filed for this visit. Wt Readings from Last 3 Encounters:  03/26/21 165 lb 3.2 oz (74.9 kg)  09/25/20 167 lb 8 oz (76 kg)  06/24/20 175 lb (79.4 kg)   Physical Exam Vitals reviewed.  Constitutional:      Appearance: Normal appearance.  Cardiovascular:     Rate and Rhythm: Normal rate and regular rhythm.     Pulses: Normal pulses.     Heart sounds: Normal heart sounds.  Pulmonary:     Effort: Pulmonary effort is normal.     Breath sounds: Normal breath sounds.  Neurological:     General: No focal deficit present.     Mental Status: He is alert and oriented to person, place, and time.  Psychiatric:        Mood and Affect: Mood normal.        Behavior:  Behavior normal.      LABORATORY DATA:  I have reviewed the labs as listed.  Latest Ref Rng & Units 09/23/2021    9:14 AM 03/23/2021    9:04 AM 09/18/2020    9:47 AM  CBC  WBC 4.0 - 10.5 K/uL 7.7  7.8  7.0   Hemoglobin 13.0 - 17.0 g/dL 14.6  14.9  15.2   Hematocrit 39.0 - 52.0 % 46.2  46.1  47.7   Platelets 150 - 400 K/uL 149  143  141       Latest Ref Rng & Units 09/23/2021    9:14 AM 03/23/2021    9:04 AM 09/18/2020    9:47 AM  CMP  Glucose 70 - 99 mg/dL 141  151  132   BUN 8 - 23 mg/dL _0 Creatinine 0.61 - 1.24 mg/dL 1.13  1.05  1.11   Sodium 135 - 145 mmol/L 138  135  136   Potassium 3.5 - 5.1 mmol/L 4.7  4.0  4.6   Chloride 98 - 111 mmol/L 100  99  100   CO2 22 - 32 mmol/L _1 Calcium 8.9 - 10.3 mg/dL 8.9  8.5  9.2   Total Protein 6.5 - 8.1 g/dL 7.7  7.4  7.6   Total Bilirubin 0.3 - 1.2 mg/dL 0.8  0.7  0.5   Alkaline Phos 38 - 126 U/L 66  76  96   AST 15 - 41 U/L _2 ALT 0 - 44 U/L _3 DIAGNOSTIC IMAGING:  I have independently reviewed the scans and discussed with the patient. CT Chest W Contrast  Result Date: 09/23/2021 CLINICAL DATA:  Restaging small cell lung cancer post chemotherapy. * Tracking Code: BO * EXAM: CT CHEST WITH CONTRAST TECHNIQUE: Multidetector CT imaging of the chest was performed during intravenous contrast administration. RADIATION DOSE REDUCTION: This exam was performed according to the departmental dose-optimization program which includes automated exposure control, adjustment of the mA and/or kV according to patient size and/or use of iterative reconstruction technique. CONTRAST:  11m OMNIPAQUE IOHEXOL 300 MG/ML  SOLN COMPARISON:  Chest CT 03/23/2021 and 09/18/2020. FINDINGS: Cardiovascular: No acute vascular findings are evident. Pulmonary arterial opacification is limited. There is a left subclavian Port-A-Cath which extends into the upper SVC. There is diffuse atherosclerosis of the aorta, great vessels and  coronary arteries. The heart size is normal. There is no pericardial effusion. Mediastinum/Nodes: There are no enlarged mediastinal, hilar or axillary lymph nodes.Stable chronic perihilar soft tissue thickening including the posterior wall of the bronchus intermedius. Atrophied thyroid tissue. Stable mild chronic esophageal dilatation with an air-fluid level. Lungs/Pleura: No pleural effusion or pneumothorax. Stable mild centrilobular and paraseptal emphysema with diffuse central airway thickening and scattered subpleural reticulation. Scattered radiation changes in the right perihilar region. No suspicious pulmonary nodules. Upper abdomen: Stable 1.3 cm right adrenal nodule, favored to reflect an adenoma based on stability. Aortic and branch vessel atherosclerosis with a partially imaged stent graft in the proximal aorta. Musculoskeletal/Chest wall: There is no chest wall mass or suspicious osseous finding. Stable anterior wedging of several thoracic vertebral bodies and multilevel spondylosis. IMPRESSION: 1. Stable chest CT without evidence of local recurrence or metastatic disease. 2. Stable radiation changes in the right perihilar region with architectural distortion and scarring. 3. Stable small right adrenal nodule from multiple prior studies, most consistent with a small adenoma. 4. Coronary and aortic atherosclerosis (ICD10-I70.0). Emphysema (ICD10-J43.9). Electronically Signed   By: WRichardean Sale  M.D.   On: 09/23/2021 14:43     ASSESSMENT:  1.  Limited stage small cell lung cancer: -Chemoradiation therapy with carboplatin and etoposide 4 cycles from 01/18/2018 through 03/27/2018. -PCI was declined. -CT chest with contrast on 10/16/2019 did not show any residual or recurrent tumor or metastatic disease.  Stable right adrenal gland nodule. -CT head with and without contrast on 10/16/2019 was negative for brain meta stasis.   PLAN:  1.  Limited stage small cell lung cancer: - He does not report any  change in baseline dyspnea or cough. - He is smoking 10 cigarettes/day. - We have talked about smoking cessation. - Reviewed CT chest (09/23/2021): Stable findings with no evidence of recurrence.  Stable small right adrenal adenoma. - Reviewed labs from 09/23/2021 which showed normal LFTs and creatinine.  CBC was normal. - Recommend follow-up in 6 months with repeat labs and scan.   Orders placed this encounter:  No orders of the defined types were placed in this encounter.    Derek Jack, MD [place] [phone]   I, Thana Ates, am acting as a scribe for Dr. Derek Jack.  I, Derek Jack MD, have reviewed the above documentation for accuracy and completeness, and I agree with the above.

## 2021-11-08 DIAGNOSIS — E0590 Thyrotoxicosis, unspecified without thyrotoxic crisis or storm: Secondary | ICD-10-CM | POA: Diagnosis not present

## 2021-11-08 DIAGNOSIS — R339 Retention of urine, unspecified: Secondary | ICD-10-CM | POA: Diagnosis not present

## 2021-11-08 DIAGNOSIS — I714 Abdominal aortic aneurysm, without rupture, unspecified: Secondary | ICD-10-CM | POA: Diagnosis not present

## 2021-11-08 DIAGNOSIS — R39198 Other difficulties with micturition: Secondary | ICD-10-CM | POA: Diagnosis not present

## 2021-11-08 DIAGNOSIS — J439 Emphysema, unspecified: Secondary | ICD-10-CM | POA: Diagnosis not present

## 2021-11-08 DIAGNOSIS — N3000 Acute cystitis without hematuria: Secondary | ICD-10-CM | POA: Diagnosis not present

## 2021-11-08 DIAGNOSIS — Z9181 History of falling: Secondary | ICD-10-CM | POA: Diagnosis not present

## 2021-11-08 DIAGNOSIS — M160 Bilateral primary osteoarthritis of hip: Secondary | ICD-10-CM | POA: Diagnosis not present

## 2021-11-16 DIAGNOSIS — R5381 Other malaise: Secondary | ICD-10-CM | POA: Diagnosis not present

## 2021-11-16 DIAGNOSIS — W19XXXA Unspecified fall, initial encounter: Secondary | ICD-10-CM | POA: Diagnosis not present

## 2021-11-16 DIAGNOSIS — S0990XA Unspecified injury of head, initial encounter: Secondary | ICD-10-CM | POA: Diagnosis not present

## 2021-11-17 DIAGNOSIS — W1839XA Other fall on same level, initial encounter: Secondary | ICD-10-CM | POA: Diagnosis not present

## 2021-11-17 DIAGNOSIS — R030 Elevated blood-pressure reading, without diagnosis of hypertension: Secondary | ICD-10-CM | POA: Diagnosis not present

## 2021-11-17 DIAGNOSIS — R079 Chest pain, unspecified: Secondary | ICD-10-CM | POA: Diagnosis not present

## 2021-11-17 DIAGNOSIS — I700 Atherosclerosis of aorta: Secondary | ICD-10-CM | POA: Diagnosis not present

## 2021-11-17 DIAGNOSIS — F17200 Nicotine dependence, unspecified, uncomplicated: Secondary | ICD-10-CM | POA: Diagnosis not present

## 2021-11-17 DIAGNOSIS — Z6823 Body mass index (BMI) 23.0-23.9, adult: Secondary | ICD-10-CM | POA: Diagnosis not present

## 2021-11-17 DIAGNOSIS — N390 Urinary tract infection, site not specified: Secondary | ICD-10-CM | POA: Diagnosis not present

## 2021-11-17 DIAGNOSIS — S0990XA Unspecified injury of head, initial encounter: Secondary | ICD-10-CM | POA: Diagnosis not present

## 2021-11-17 DIAGNOSIS — Z85118 Personal history of other malignant neoplasm of bronchus and lung: Secondary | ICD-10-CM | POA: Diagnosis not present

## 2021-11-17 DIAGNOSIS — S0093XA Contusion of unspecified part of head, initial encounter: Secondary | ICD-10-CM | POA: Diagnosis not present

## 2021-11-17 DIAGNOSIS — E039 Hypothyroidism, unspecified: Secondary | ICD-10-CM | POA: Diagnosis not present

## 2021-11-17 DIAGNOSIS — M47812 Spondylosis without myelopathy or radiculopathy, cervical region: Secondary | ICD-10-CM | POA: Diagnosis not present

## 2021-11-17 DIAGNOSIS — Z87898 Personal history of other specified conditions: Secondary | ICD-10-CM | POA: Diagnosis not present

## 2021-11-25 ENCOUNTER — Inpatient Hospital Stay: Payer: Non-veteran care | Attending: Cardiology | Admitting: Cardiology

## 2021-11-25 ENCOUNTER — Encounter: Payer: Self-pay | Admitting: Cardiology

## 2021-11-25 VITALS — BP 134/78 | HR 78 | Ht 69.0 in | Wt 162.0 lb

## 2021-11-25 DIAGNOSIS — E782 Mixed hyperlipidemia: Secondary | ICD-10-CM | POA: Diagnosis not present

## 2021-11-25 DIAGNOSIS — R296 Repeated falls: Secondary | ICD-10-CM | POA: Diagnosis not present

## 2021-11-25 DIAGNOSIS — Z01810 Encounter for preprocedural cardiovascular examination: Secondary | ICD-10-CM

## 2021-11-25 NOTE — Progress Notes (Signed)
Cardiology Office Note  Date: 11/25/2021   ID: Terry Boyd, DOB 01/14/1941, MRN [id number]  PCP:  [place] Nation, MD  Cardiologist:  Rozann Lesches, MD Electrophysiologist:  None   Chief Complaint  Patient presents with   Cardiac follow-up    History of Present Illness: Terry Boyd is a 81 y.o. male that I met back in April 2022.  He presents to the office reportedly for preprocedure cardiac evaluation.  I was not provided with any information, but he states that he was to undergo cataract surgery through the [place] system in [place], procedure was canceled.  He tells me that he was coughing at the time and that they apparently wanted to sedate him more heavily, but became concerned about his medical history and wanted him evaluated first.  Cataract surgery in general is very low risk from a cardiac perspective.  He has a history of PAD with AAA and previous EVAR, possible old inferior infarct scar by remote Myoview but no active angina symptoms.  He does not report any palpitations or interval syncope.  I personally reviewed his ECG today which shows sinus rhythm with decreased R wave progression, borderline significant inferior Q waves.  Records indicate ER visit at [facility] on September 19 with increasing falls, had had a recent UTI as well.  No acute findings noted during work-up.  Head CT reported no fractures or acute intracranial/calvarial findings with chronic small vessel ischemic changes and mild atrophy.  ECG reported sinus rhythm.  He indicates having trouble with his balance, actually fell out of bed the evening that he was evaluated.  No frank syncope.  Past Medical History:  Diagnosis Date   AAA (abdominal aortic aneurysm) ([place])    EVAR in 2011 by Dr. Oneida Alar   Arthritis    Chronic back pain    COPD (chronic obstructive pulmonary disease) ([place])    Depression    Essential hypertension    GERD (gastroesophageal reflux disease)    History of hiatal  hernia    Hypothyroidism    Peripheral vascular disease (HCC)    Small cell lung cancer (HCC)    Carboplatin and etoposide x 4 cycles from 01/18/2018 to 03/29/2018    Past Surgical History:  Procedure Laterality Date   ABDOMINAL AORTAGRAM N/A 03/14/2012   Procedure: ABDOMINAL Maxcine Ham;  Surgeon: Serafina Mitchell, MD;  Location: [facility] CATH LAB;  Service: Cardiovascular;  Laterality: N/A;   ABDOMINAL AORTIC ANEURYSM REPAIR  04-22-2009   Stent graft repair of AAA   APPENDECTOMY     BRONCHIAL BRUSHINGS Right 11/30/2017   Procedure: BRONCHIAL BRUSHINGS;  Surgeon: Sinda Du, MD;  [facility];  Service: Cardiopulmonary;  Laterality: Right;   BRONCHIAL WASHINGS Right 11/30/2017   Procedure: BRONCHIAL WASHINGS;  Surgeon: Sinda Du, MD;  [facility];  Service: Cardiopulmonary;  Laterality: Right;   BYPASS GRAFT POPLITEAL TO POPLITEAL  03/20/2012   Procedure: BYPASS GRAFT POPLITEAL TO POPLITEAL;  Surgeon: Elam Dutch, MD;  Location: [facility];  Service: Vascular;  Laterality: Left;  ABOVE THE KNEE POPLITEAL ARTERY TO BELOW THE KNEE POPLITEAL ARTERY BYPASS GRAFT USING REVERSE LEFT GREATER SAPHENOUS VEIN    ENDARTERECTOMY POPLITEAL  03/20/2012   Procedure: ENDARTERECTOMY POPLITEAL;  Surgeon: Elam Dutch, MD;  Location[facility] OR;  Service: Vascular;  Laterality: Left;   LIGATION OF LEFT LEG POPLITEAL ANEURYSM   ESOPHAGOGASTRODUODENOSCOPY  09/28/2017   Dr. Lurlean Nanny: Medium sized hiatal hernia, residual food.  Suspect gastroparesis.  Gastric atony.   ESOPHAGOGASTRODUODENOSCOPY  01/05/2018   [place].  Large amount of solid food upon entry to the stomach.  Pylorus appeared patent.  Normal duodenal mucosa without evidence of obstruction to the second portion of duodenum.  Balloon dilation performed of the pylorus.   FLEXIBLE BRONCHOSCOPY N/A 11/30/2017   Procedure: FLEXIBLE BRONCHOSCOPY;  Surgeon: Sinda Du, MD;  [facility];  Service: Cardiopulmonary;   Laterality: N/A;   INTRAOPERATIVE ARTERIOGRAM  03/20/2012   Procedure: INTRA OPERATIVE ARTERIOGRAM;  Surgeon: Elam Dutch, MD;  Location: [facility];  Service: Vascular;  Laterality: Left;  TIMES ONE   PORTACATH PLACEMENT Left 01/06/2018   Procedure: INSERTION PORT-A-CATH;  Surgeon: Aviva Signs, MD;  [facility];  Service: General;  Laterality: Left;   [place] N/A 12/23/2017   Procedure: VIDEO BRONCHOSCOPY WITH ENDOBRONCHIAL ULTRASOUND;  Surgeon: Melrose Nakayama, MD;  Location: [facility] OR;  Service: Thoracic;  Laterality: N/A;    Current Outpatient Medications  Medication Sig Dispense Refill   Ipratropium-Albuterol (COMBIVENT) 20-100 MCG/ACT AERS respimat Inhale 1 puff into the lungs every 6 (six) hours as needed for wheezing.     levothyroxine (SYNTHROID) 137 MCG tablet Take 137 mcg by mouth every other day. Alternates with 150 mcg     LORazepam (ATIVAN) 1 MG tablet Take 1 tablet (1 mg total) by mouth 2 (two) times daily. 60 tablet 3   sertraline (ZOLOFT) 50 MG tablet Take 50 mg by mouth daily.     simvastatin (ZOCOR) 10 MG tablet Take 10 mg by mouth at bedtime. (Patient not taking: Reported on 11/25/2021)     No current facility-administered medications for this visit.   Allergies:  Morphine and related   ROS: No orthopnea or PND.  Physical Exam: VS:  BP 134/78   Pulse 78   Ht _0  (1.753 m)   Wt 162 lb (73.5 kg)   SpO2 95%   BMI 23.92 kg/m , BMI Body mass index is 23.92 kg/m.  Wt Readings from Last 3 Encounters:  11/25/21 162 lb (73.5 kg)  09/30/21 159 lb 3.2 oz (72.2 kg)  03/26/21 165 lb 3.2 oz (74.9 kg)    General: Patient appears comfortable at rest. HEENT: Conjunctiva and lids normal. Neck: Supple, no elevated JVP or carotid bruits, no thyromegaly. Lungs: Clear to auscultation, nonlabored breathing at rest. Cardiac: Regular rate and rhythm, no S3, 2/6 systolic murmur, no pericardial rub. Abdomen:  Soft, bowel sounds present. Extremities: No pitting edema.  ECG:  An ECG dated 06/24/2020 was personally reviewed today and demonstrated:  Sinus rhythm with PAC, possible old inferior infarct pattern.  Recent Labwork: 09/23/2021: ALT 13; AST 17; BUN 12; Creatinine, Ser 1.13; Hemoglobin 14.6; Platelets 149; Potassium 4.7; Sodium 138  September 2023: High-sensitivity troponin I 14, hemoglobin 13.9, platelets 337, BUN 21, creatinine 1.12, potassium 4.5  Other Studies Reviewed Today:  Cardiac monitor May 2022: ZIO XT reviewed.  3 days analyzed.  Predominant rhythm is sinus with heart rate ranging from 51 bpm up to 120 bpm and average heart rate 73 bpm.  Occasional PACs were noted representing 4.7% total beats, rare couplets and triplets otherwise noted.  There were also rare PVCs including couplets and triplets representing less than 1% total beats.  Three episodes have nonsustained ventricular tachycardia were noted, the longest of which was 7 beats.  There were also five runs of PSVT, brief with the longest lasting 9 beats.  Rare atrial fibrillation  was also noted representing less than 1% rhythm burden.  No pauses.  Echocardiogram 07/24/2020:  1. Left ventricular ejection fraction, by estimation, is 50 to 55%. The  left ventricle has low normal function. Left ventricular endocardial  border not optimally defined to evaluate regional wall motion. There is  moderate left ventricular hypertrophy.  Left ventricular diastolic parameters are indeterminate.   2. Right ventricular systolic function is normal. The right ventricular  size is normal. Tricuspid regurgitation signal is inadequate for assessing  PA pressure.   3. The mitral valve is grossly normal. Trivial mitral valve  regurgitation.   4. The aortic valve is tricuspid. Aortic valve regurgitation is not  visualized.   5. The inferior vena cava is normal in size with greater than 50%  respiratory variability, suggesting right atrial  pressure of 3 mmHg.   Assessment and Plan:  1.  Preprocedure cardiac evaluation pending cataract surgery, generally very low risk from a cardiac perspective.  He has a history of PAD, possible remote inferior infarct but no active angina. ECG reviewed and stable.  He should be able to proceed with cataract surgery as planned.  2.  Recurring falls, patient reports loss of balance but no syncope.  He was just seen in the ER at [facility] recently with head CT showing no acute findings.  3.  PAD with history of AAA status post EVAR.  4.  Mixed hyperlipidemia, on Zocor.  Medication Adjustments/Labs and Tests Ordered: Current medicines are reviewed at length with the patient today.  Concerns regarding medicines are outlined above.   Tests Ordered: Orders Placed This Encounter  Procedures   EKG 12-Lead    Medication Changes: No orders of the defined types were placed in this encounter.   Disposition:  Follow up  1 year.  Signed, Satira Sark, MD, [facility] 11/25/2021 2:44 PM    [place] at [facility] [address] [place], [place] [address] Phone: [phone]; Fax: [phone]

## 2021-11-25 NOTE — Patient Instructions (Signed)
Medication Instructions:  Your physician recommends that you continue on your current medications as directed. Please refer to the Current Medication list given to you today.   Labwork: None today  Testing/Procedures: None today  Follow-Up: 1 year  Any Other Special Instructions Will Be Listed Below (If Applicable).  If you need a refill on your cardiac medications before your next appointment, please call your pharmacy.  

## 2021-11-30 ENCOUNTER — Other Ambulatory Visit: Payer: Self-pay

## 2021-11-30 DIAGNOSIS — C3490 Malignant neoplasm of unspecified part of unspecified bronchus or lung: Secondary | ICD-10-CM

## 2021-11-30 MED ORDER — LORAZEPAM 1 MG PO TABS: 1.0000 mg | ORAL_TABLET | Freq: Two times a day (BID) | ORAL | 3 refills | Status: DC

## 2021-12-03 ENCOUNTER — Encounter: Payer: Self-pay | Admitting: Urology

## 2021-12-03 ENCOUNTER — Ambulatory Visit (INDEPENDENT_AMBULATORY_CARE_PROVIDER_SITE_OTHER): Payer: Medicare Other | Admitting: Urology

## 2021-12-03 VITALS — BP 120/79 | HR 103 | Ht 69.0 in | Wt 165.0 lb

## 2021-12-03 DIAGNOSIS — Z87440 Personal history of urinary (tract) infections: Secondary | ICD-10-CM | POA: Diagnosis not present

## 2021-12-03 DIAGNOSIS — N400 Enlarged prostate without lower urinary tract symptoms: Secondary | ICD-10-CM

## 2021-12-03 LAB — URINALYSIS, ROUTINE W REFLEX MICROSCOPIC
Bilirubin, UA: NEGATIVE
Glucose, UA: NEGATIVE
Ketones, UA: NEGATIVE
Leukocytes,UA: NEGATIVE
Nitrite, UA: NEGATIVE
Protein,UA: NEGATIVE
Specific Gravity, UA: 1.01 (ref 1.005–1.030)
Urobilinogen, Ur: 0.2 mg/dL (ref 0.2–1.0)
pH, UA: 5.5 (ref 5.0–7.5)

## 2021-12-03 LAB — MICROSCOPIC EXAMINATION: RBC, Urine: NONE SEEN /hpf (ref 0–2)

## 2021-12-03 LAB — BLADDER SCAN AMB NON-IMAGING: Scan Result: 129

## 2021-12-03 NOTE — Progress Notes (Signed)
post void residual=129mL ?

## 2021-12-03 NOTE — Progress Notes (Signed)
Assessment: 1. History of UTI   2. BPH without obstruction/lower urinary tract symptoms     Plan: I personally reviewed the records from his 2 ER visits in September 2023 as well as notes from Dr. Jimmye Norman. No current evidence of a UTI.  He is currently symptom-free. Given his recent falls, I would not recommend addition of an alpha-blocker due to the risk of hypotension. Return to office prn  Chief Complaint:  Chief Complaint  Patient presents with   Urinary Tract Infection    History of Present Illness:  Terry Boyd is a 81 y.o. male who is seen in consultation from [place], MD for evaluation of dysuria and recent UTI's. He was seen in the emergency room on 11/08/2021 after a fall on his way to the bathroom.  He was having frequency, urgency, and dysuria at that time.  Urinalysis showed 25 WBCs, 5 RBCs, and few bacteria.  Urine culture grew <10K gram-negative rods.  He was treated with Keflex.  He continued to have symptoms at a visit with his PCP on 11/17/2021.  He was referred to the emergency room due to ongoing symptoms.  Urinalysis from 11/17/2021 showed 0 RBCs, 0 WBCs, and no bacteria.  He was treated with Rocephin IM and started on cefdinir x10 days.  His urinary symptoms have resolved.  He is no longer having the dysuria or urgency.  No flank pain.  No gross hematuria. No history of kidney stones.  No history of urologic surgery. He was having issues with constipation around the time of his urinary symptoms.  This has since resolved.  Past Medical History:  Past Medical History:  Diagnosis Date   AAA (abdominal aortic aneurysm) ([place])    EVAR in 2011 by Dr. Oneida Alar   Arthritis    Chronic back pain    COPD (chronic obstructive pulmonary disease) ([place])    Depression    Essential hypertension    GERD (gastroesophageal reflux disease)    History of hiatal hernia    Hypothyroidism    Peripheral vascular disease (H[facility])    Small cell lung cancer (H[facility])     Carboplatin and etoposide x 4 cycles from 01/18/2018 to 03/29/2018    Past Surgical History:  Past Surgical History:  Procedure Laterality Date   ABDOMINAL AORTAGRAM N/A 03/14/2012   Procedure: ABDOMINAL Maxcine Ham;  Surgeon: Serafina Mitchell, MD;  Location: [facility] CATH LAB;  Service: Cardiovascular;  Laterality: N/A;   ABDOMINAL AORTIC ANEURYSM REPAIR  04-22-2009   Stent graft repair of AAA   APPENDECTOMY     BRONCHIAL BRUSHINGS Right 11/30/2017   Procedure: BRONCHIAL BRUSHINGS;  Surgeon: Sinda Du, MD;  [facility]: [facility];  Service: Cardiopulmonary;  Laterality: Right;   BRONCHIAL WASHINGS Right 11/30/2017   Procedure: BRONCHIAL WASHINGS;  Surgeon: Sinda Du, MD;  Location: [facility];  Service: Cardiopulmonary;  Laterality: Right;   BYPASS GRAFT POPLITEAL TO POPLITEAL  03/20/2012   Procedure: BYPASS GRAFT POPLITEAL TO POPLITEAL;  Surgeon: Elam Dutch, MD;  Location: [place];  Service: Vascular;  Laterality: Left;  ABOVE THE KNEE POPLITEAL ARTERY TO BELOW THE KNEE POPLITEAL ARTERY BYPASS GRAFT USING REVERSE LEFT GREATER SAPHENOUS VEIN    ENDARTERECTOMY POPLITEAL  03/20/2012   Procedure: ENDARTERECTOMY POPLITEAL;  Surgeon: Elam Dutch, MD;  Location: [facility] OR;  Service: Vascular;  Laterality: Left;   LIGATION OF LEFT LEG POPLITEAL ANEURYSM   ESOPHAGOGASTRODUODENOSCOPY  09/28/2017   Dr. Lurlean Nanny: Medium sized hiatal hernia, residual food.  Suspect gastroparesis.  Gastric atony.   ESOPHAGOGASTRODUODENOSCOPY  01/05/2018   [place].  Large amount of solid food upon entry to the stomach.  Pylorus appeared patent.  Normal duodenal mucosa without evidence of obstruction to the second portion of duodenum.  Balloon dilation performed of the pylorus.   FLEXIBLE BRONCHOSCOPY N/A 11/30/2017   Procedure: FLEXIBLE BRONCHOSCOPY;  Surgeon: Sinda Du, MD;  [facility];  Service: Cardiopulmonary;  Laterality: N/A;   INTRAOPERATIVE ARTERIOGRAM  03/20/2012   Procedure:  INTRA OPERATIVE ARTERIOGRAM;  Surgeon: Elam Dutch, MD;  Location: [place];  Service: Vascular;  Laterality: Left;  TIMES ONE   PORTACATH PLACEMENT Left 01/06/2018   Procedure: INSERTION PORT-A-CATH;  Surgeon: Aviva Signs, MD;  Location: [facility];  Service: General;  Laterality: Left;   Tees Toh N/A 12/23/2017   Procedure: VIDEO BRONCHOSCOPY WITH ENDOBRONCHIAL ULTRASOUND;  Surgeon: Melrose Nakayama, MD;  Location: [facility] [facility];  Service: Thoracic;  Laterality: N/A;    Allergies:  Allergies  Allergen Reactions   Morphine And Related Shortness Of Breath and Other (See Comments)    Hallucinations pt has taken hydromorphone before    Family History:  Family History  Problem Relation Age of Onset   Diabetes Mother    Stroke Mother    Heart disease Father    Diabetes Father    Heart disease Sister        CABG x 4   Cancer Brother        throat cancer   Diabetes Sister    Heart disease Sister    Cancer Brother        pancreatic   Prostate cancer Brother    Kidney disease Son    Colon cancer Neg Hx     Social History:  Social History   Tobacco Use   Smoking status: Every Day    Types: Cigars   Smokeless tobacco: Never  Vaping Use   Vaping Use: Never used  Substance Use Topics   Alcohol use: No   Drug use: No    Review of symptoms:  Constitutional:  Negative for unexplained weight loss, night sweats, fever, chills ENT:  Negative for nose bleeds, sinus pain, painful swallowing CV:  Negative for chest pain, shortness of breath, exercise intolerance, palpitations, loss of consciousness Resp:  Negative for cough, wheezing, shortness of breath GI:  Negative for nausea, vomiting, diarrhea, bloody stools GU:  Positives noted in HPI; otherwise negative for gross hematuria, urinary incontinence Neuro:  Negative for seizures, poor balance, limb weakness, slurred speech Psych:  Negative for lack of energy, depression,  anxiety Endocrine:  Negative for polydipsia, polyuria, symptoms of hypoglycemia (dizziness, hunger, sweating) Hematologic:  Negative for anemia, purpura, petechia, prolonged or excessive bleeding, use of anticoagulants  Allergic:  Negative for difficulty breathing or choking as a result of exposure to anything; no shellfish allergy; no allergic response (rash/itch) to materials, foods  Physical exam: BP 120/79   Pulse (!) 103   Ht _0  (1.753 m)   Wt 165 lb (74.8 kg)   BMI 24.37 kg/m  GENERAL APPEARANCE:  Well appearing, well developed, well nourished, NAD HEENT: Atraumatic, Normocephalic, oropharynx clear. NECK: Supple without lymphadenopathy or thyromegaly. LUNGS: Clear to auscultation bilaterally. HEART: Regular Rate and Rhythm without murmurs, gallops, or rubs. ABDOMEN: Soft, non-tender, No Masses. EXTREMITIES: Moves all extremities well.  Without clubbing, cyanosis, or edema. NEUROLOGIC:  Alert and oriented x 3, normal gait, CN II-XII grossly  intact.  MENTAL STATUS:  Appropriate. BACK:  Non-tender to palpation.  No CVAT SKIN:  Warm, dry and intact.   GU: Penis:  uncircumcised Meatus: Normal Scrotum: normal, no masses Testis: normal without masses bilateral Prostate: 40 g, NT, no nodules Rectum: Normal tone,  no masses or tenderness   Results: U/A: 0-5 WBCs, no RBCs, few bacteria, nitrite negative  PVR = 128 ml

## 2021-12-28 ENCOUNTER — Telehealth (INDEPENDENT_AMBULATORY_CARE_PROVIDER_SITE_OTHER): Payer: Self-pay

## 2021-12-28 NOTE — Telephone Encounter (Signed)
   Pre-operative Risk Assessment    Patient Name: Terry Boyd  DOB: June 17, 1940 MRN: [id number]{     Request for Surgical Clearance    Procedure:   Cataract  Date of Surgery:  Clearance TBD                                 Surgeon:  Dr. Louisa Second Surgeon's Group or Practice Name:  [facility] [place] Phone number:  [phone] Ext [phone] Fax number:  [phone]   Type of Clearance Requested:   - Medical    Type of Anesthesia:  General    Additional requests/questions:    SignedSung Amabile   12/28/2021, 3:57 PM

## 2021-12-28 NOTE — Addendum Note (Signed)
Addended by: Finis Bud on: 12/28/2021 04:54 PM   Modules accepted: Orders, Level of Service

## 2021-12-28 NOTE — Telephone Encounter (Signed)
This encounter was created in error - please disregard.

## 2021-12-29 NOTE — Telephone Encounter (Signed)
    Patient Name: Terry Boyd  DOB: 08/23/1940 MRN: [id number]  Primary Cardiologist: Rozann Lesches, MD  Chart reviewed as part of pre-operative protocol coverage. Pre-op clearance already addressed by colleagues in earlier phone notes. To summarize recommendations:  --So if I understand you correctly, he is to undergo cataract surgery under general anesthesia?  If that is the case, his RCRI perioperative cardiac risk index is class II, 0.9% chance of major adverse cardiac event with general anesthesia.  He was not reporting any angina at the last office visit, nor heart failure symptoms.  Taking all of his comorbidities and this into account, his perioperative risk is intermediate and there is no direct cardiac contraindication for him to proceed.  -Dr. Domenic Polite  Will route this bundled recommendation to requesting provider via Epic fax function and remove from pre-op pool. Please call with questions.  Elgie Collard, PA-C 12/29/2021, 1:19 PM

## 2021-12-29 NOTE — Telephone Encounter (Signed)
I left message for the requesting office to please call back [phone] and confirm if this the same cataract surgery that Dr. Domenic Polite saw the pt for, or is this a new procedure.

## 2021-12-29 NOTE — Addendum Note (Signed)
Addended by: Elgie Collard on: 12/29/2021 01:18 PM   Modules accepted: Level of Service

## 2021-12-29 NOTE — Telephone Encounter (Signed)
Terry Boyd from requesting office called back about clearance. Terry Boyd does state this is the same clearance that Dr. Domenic Polite did d/w the pt back in 10/2021, however clearance note was not available for MD at the time of the visit with the pt.   Terry Boyd states anesthesiologist wants to be sure that Dr. Domenic Polite is aware this procedure will be done under general anesthesia, and not MAC. I assured Terry Boyd that I will have Dr. Domenic Polite and pre op provider review and give their input and recommendations if ok to proceed with general anesthesia . Once a final clearance we ill fax clearance notes. Terry Boyd thanked me for the help.

## 2021-12-29 NOTE — Telephone Encounter (Signed)
   Patient Name: Terry Boyd  DOB: [birth year] MRN: [id number]  Primary Cardiologist: Rozann Lesches, MD  Chart reviewed as part of pre-operative protocol coverage. Pre-op clearance already addressed by colleagues in earlier phone notes. To summarize recommendations:  -So if I understand you correctly, he is to undergo cataract surgery under general anesthesia?  If that is the case, his RCRI perioperative cardiac risk index is class II, 0.9% chance of major adverse cardiac event with general anesthesia.  He was not reporting any angina at the last office visit, nor heart failure symptoms.  Taking all of his comorbidities and this into account, his perioperative risk is intermediate and there is no direct cardiac contraindication for him to proceed.  -Dr. Domenic Polite  Will route this bundled recommendation to requesting provider via Epic fax function and remove from pre-op pool. Please call with questions.  Elgie Collard, PA-C 12/29/2021, 1:13 PM

## 2021-12-30 DIAGNOSIS — Z23 Encounter for immunization: Secondary | ICD-10-CM | POA: Diagnosis not present

## 2022-01-04 DIAGNOSIS — J439 Emphysema, unspecified: Secondary | ICD-10-CM | POA: Diagnosis not present

## 2022-01-04 DIAGNOSIS — S50811A Abrasion of right forearm, initial encounter: Secondary | ICD-10-CM | POA: Diagnosis not present

## 2022-01-04 DIAGNOSIS — Z23 Encounter for immunization: Secondary | ICD-10-CM | POA: Diagnosis not present

## 2022-01-04 DIAGNOSIS — F17200 Nicotine dependence, unspecified, uncomplicated: Secondary | ICD-10-CM | POA: Diagnosis not present

## 2022-01-04 DIAGNOSIS — K219 Gastro-esophageal reflux disease without esophagitis: Secondary | ICD-10-CM | POA: Diagnosis not present

## 2022-01-04 DIAGNOSIS — E0590 Thyrotoxicosis, unspecified without thyrotoxic crisis or storm: Secondary | ICD-10-CM | POA: Diagnosis not present

## 2022-01-04 DIAGNOSIS — W010XXA Fall on same level from slipping, tripping and stumbling without subsequent striking against object, initial encounter: Secondary | ICD-10-CM | POA: Diagnosis not present

## 2022-01-04 DIAGNOSIS — S50812A Abrasion of left forearm, initial encounter: Secondary | ICD-10-CM | POA: Diagnosis not present

## 2022-01-04 DIAGNOSIS — F32A Depression, unspecified: Secondary | ICD-10-CM | POA: Diagnosis not present

## 2022-01-04 DIAGNOSIS — Z79899 Other long term (current) drug therapy: Secondary | ICD-10-CM | POA: Diagnosis not present

## 2022-01-18 DIAGNOSIS — H2511 Age-related nuclear cataract, right eye: Secondary | ICD-10-CM | POA: Diagnosis not present

## 2022-01-18 DIAGNOSIS — H353132 Nonexudative age-related macular degeneration, bilateral, intermediate dry stage: Secondary | ICD-10-CM | POA: Diagnosis not present

## 2022-01-18 DIAGNOSIS — H2513 Age-related nuclear cataract, bilateral: Secondary | ICD-10-CM | POA: Diagnosis not present

## 2022-01-19 NOTE — H&P (Signed)
Surgical History & Physical  Patient Name: Terry Boyd DOB: 11-10-1940  Surgery: Cataract extraction with intraocular lens implant phacoemulsification; Right Eye  Surgeon: Baruch Goldmann MD Surgery Date:  01-25-22 Pre-Op Date:  01-18-22  HPI: A 19 Yr. old male patient is here for cataract eval. S/p Quickert procedure RLL. 1. 1. The patient complains of difficulty when recognizing people/driving, which began 6 months ago. Both eyes are affected. The episode is gradual. The condition's severity increased since last visit. Symptoms occur when the patient is driving and outside. The complaint is associated with glare. This is negatively affecting the patient's quality of life and the patient is unable to function adequately in life with the current level of vision. HPI was performed by Baruch Goldmann .  Medical History: Cataracts Cancer Lung Problems Thyroid Problems pt blacks out after moderate cough attacks, stays ...  Review of Systems Negative Allergic/Immunologic Negative Cardiovascular Negative Constitutional Negative Ear, Nose, Mouth & Throat Negative Endocrine Negative Eyes Negative Gastrointestinal Negative Genitourinary Negative Hemotologic/Lymphatic Negative Integumentary Negative Musculoskeletal Negative Neurological Negative Psychiatry Negative Respiratory  Social   Current every day smoker /  Cigars    Alcohol Former Drinker  Medication Artificial Tears,  LORazepam, LEVOTHYROXINE SODIUM, EUTHYROX,   Sx/Procedures Eyelid - Entropion Repair by Suture Technique,  Stents In Legs,   Drug Allergies   NKDA  History & Physical: Heent: Cataract, right eye NECK: supple without bruits LUNGS: lungs clear to auscultation CV: regular rate and rhythm Abdomen: soft and non-tender Impression & Plan: Assessment: 1.  NUCLEAR SCLEROSIS AGE RELATED; Both Eyes (H25.13) 2.  ENTROPION SPASTIC; Right Lower Lid (H02.042) 3.  ARMD DRY; Both Eyes Intermediate  (H35.3132) 4.  COMBINED FORMS AGE RELATED CATARACT; Left Eye (H25.812) 5.  ASTIGMATISM, REGULAR; Both Eyes (H52.223)  Plan: 1.  Cataract accounts for the patient's decreased vision. This visual impairment is not correctable with a tolerable change in glasses or contact lenses. Cataract surgery with an implantation of a new lens should significantly improve the visual and functional status of the patient. Discussed all risks, benefits, alternatives, and potential complications. Discussed the procedures and recovery. Patient desires to have surgery. A-scan ordered and performed today for intra-ocular lens calculations. The surgery will be performed in order to improve vision for driving, reading, and for eye examinations. Recommend phacoemulsification with intra-ocular lens. Recommend Dextenza for post-operative pain and inflammation. Right Eye worse - first. Dilates poorly - shugarcaine by protocol. Malyugin Ring. Omidira.  2.  S/P Quickert repair. Lid position excellent.  3.  On OCT. Recommend quitting smoking. Amsler grid testing weekly. Continue AREDS 2 vitamins.  4.  will address after right eye.  5.  Recommend Toric IOL OS only. Veteran - waive surgeon's fee.

## 2022-01-20 ENCOUNTER — Encounter (HOSPITAL_COMMUNITY)
Admission: RE | Admit: 2022-01-20 | Discharge: 2022-01-20 | Disposition: A | Discharge status: Home / Self Care | Payer: Non-veteran care | Source: Ambulatory Visit | Attending: Ophthalmology | Admitting: Ophthalmology

## 2022-01-25 ENCOUNTER — Ambulatory Visit (HOSPITAL_BASED_OUTPATIENT_CLINIC_OR_DEPARTMENT_OTHER): Payer: Medicare Other | Admitting: Anesthesiology

## 2022-01-25 ENCOUNTER — Ambulatory Visit (HOSPITAL_COMMUNITY)
Admission: RE | Admit: 2022-01-25 | Discharge: 2022-01-25 | Disposition: A | Discharge status: Home / Self Care | Payer: Medicare Other | Attending: Ophthalmology | Admitting: Ophthalmology

## 2022-01-25 ENCOUNTER — Encounter (HOSPITAL_COMMUNITY)
Admission: RE | Disposition: A | Discharge status: Home / Self Care | Payer: Self-pay | Source: Home / Self Care | Attending: Ophthalmology

## 2022-01-25 ENCOUNTER — Encounter (HOSPITAL_COMMUNITY): Payer: Self-pay | Admitting: Ophthalmology

## 2022-01-25 ENCOUNTER — Ambulatory Visit (HOSPITAL_COMMUNITY): Payer: Medicare Other | Admitting: Anesthesiology

## 2022-01-25 DIAGNOSIS — H52223 Regular astigmatism, bilateral: Secondary | ICD-10-CM | POA: Insufficient documentation

## 2022-01-25 DIAGNOSIS — F17219 Nicotine dependence, cigarettes, with unspecified nicotine-induced disorders: Secondary | ICD-10-CM

## 2022-01-25 DIAGNOSIS — H2511 Age-related nuclear cataract, right eye: Secondary | ICD-10-CM | POA: Diagnosis not present

## 2022-01-25 DIAGNOSIS — Z85118 Personal history of other malignant neoplasm of bronchus and lung: Secondary | ICD-10-CM | POA: Diagnosis not present

## 2022-01-25 DIAGNOSIS — H2513 Age-related nuclear cataract, bilateral: Secondary | ICD-10-CM | POA: Diagnosis not present

## 2022-01-25 DIAGNOSIS — Z79899 Other long term (current) drug therapy: Secondary | ICD-10-CM | POA: Diagnosis not present

## 2022-01-25 DIAGNOSIS — I10 Essential (primary) hypertension: Secondary | ICD-10-CM | POA: Diagnosis not present

## 2022-01-25 DIAGNOSIS — I739 Peripheral vascular disease, unspecified: Secondary | ICD-10-CM | POA: Diagnosis not present

## 2022-01-25 DIAGNOSIS — H02002 Unspecified entropion of right lower eyelid: Secondary | ICD-10-CM | POA: Diagnosis not present

## 2022-01-25 DIAGNOSIS — H25819 Combined forms of age-related cataract, unspecified eye: Secondary | ICD-10-CM | POA: Insufficient documentation

## 2022-01-25 DIAGNOSIS — F17290 Nicotine dependence, other tobacco product, uncomplicated: Secondary | ICD-10-CM | POA: Diagnosis not present

## 2022-01-25 DIAGNOSIS — J449 Chronic obstructive pulmonary disease, unspecified: Secondary | ICD-10-CM

## 2022-01-25 DIAGNOSIS — J439 Emphysema, unspecified: Secondary | ICD-10-CM | POA: Insufficient documentation

## 2022-01-25 DIAGNOSIS — E039 Hypothyroidism, unspecified: Secondary | ICD-10-CM | POA: Insufficient documentation

## 2022-01-25 DIAGNOSIS — F17210 Nicotine dependence, cigarettes, uncomplicated: Secondary | ICD-10-CM | POA: Diagnosis not present

## 2022-01-25 HISTORY — PX: CATARACT EXTRACTION W/PHACO: SHX586

## 2022-01-25 SURGERY — PHACOEMULSIFICATION, CATARACT, WITH IOL INSERTION
Anesthesia: Monitor Anesthesia Care | Site: Eye | Laterality: Right

## 2022-01-25 MED ORDER — TROPICAMIDE 1 % OP SOLN
1.0000 [drp] | OPHTHALMIC | Status: AC | PRN
  Administered 2022-01-25 (×3): 1 [drp] via OPHTHALMIC

## 2022-01-25 MED ORDER — POVIDONE-IODINE 5 % OP SOLN
OPHTHALMIC | Status: DC | PRN
  Administered 2022-01-25: 1 via OPHTHALMIC

## 2022-01-25 MED ORDER — LIDOCAINE HCL 3.5 % OP GEL
1.0000 | Freq: Once | OPHTHALMIC | Status: AC
  Administered 2022-01-25: 1 via OPHTHALMIC

## 2022-01-25 MED ORDER — PHENYLEPHRINE HCL 2.5 % OP SOLN
1.0000 [drp] | OPHTHALMIC | Status: AC | PRN
  Administered 2022-01-25 (×3): 1 [drp] via OPHTHALMIC

## 2022-01-25 MED ORDER — SODIUM HYALURONATE 10 MG/ML IO SOLUTION
PREFILLED_SYRINGE | INTRAOCULAR | Status: DC | PRN
  Administered 2022-01-25: .85 mL via INTRAOCULAR

## 2022-01-25 MED ORDER — MOXIFLOXACIN HCL 0.5 % OP SOLN
OPHTHALMIC | Status: DC | PRN
  Administered 2022-01-25: 3 mL via OPHTHALMIC

## 2022-01-25 MED ORDER — MOXIFLOXACIN HCL 5 MG/ML IO SOLN
INTRAOCULAR | Status: AC
  Filled 2022-01-25: qty 1

## 2022-01-25 MED ORDER — EPINEPHRINE PF 1 MG/ML IJ SOLN
INTRAMUSCULAR | Status: AC
  Filled 2022-01-25: qty 2

## 2022-01-25 MED ORDER — STERILE WATER FOR IRRIGATION IR SOLN
Status: DC | PRN
  Administered 2022-01-25: 25 mL

## 2022-01-25 MED ORDER — LIDOCAINE HCL (PF) 1 % IJ SOLN
INTRAOCULAR | Status: DC | PRN
  Administered 2022-01-25: 1 mL via OPHTHALMIC

## 2022-01-25 MED ORDER — TETRACAINE HCL 0.5 % OP SOLN
1.0000 [drp] | OPHTHALMIC | Status: AC | PRN
  Administered 2022-01-25 (×3): 1 [drp] via OPHTHALMIC

## 2022-01-25 MED ORDER — SODIUM HYALURONATE 23MG/ML IO SOSY
PREFILLED_SYRINGE | INTRAOCULAR | Status: DC | PRN
  Administered 2022-01-25: .6 mL via INTRAOCULAR

## 2022-01-25 MED ORDER — BSS IO SOLN
INTRAOCULAR | Status: DC | PRN
  Administered 2022-01-25: 15 mL via INTRAOCULAR

## 2022-01-25 MED ORDER — PHENYLEPHRINE-KETOROLAC 1-0.3 % IO SOLN
INTRAOCULAR | Status: AC
  Filled 2022-01-25: qty 4

## 2022-01-25 MED ORDER — PHENYLEPHRINE-KETOROLAC 1-0.3 % IO SOLN
INTRAOCULAR | Status: DC | PRN
  Administered 2022-01-25: 500 mL via OPHTHALMIC

## 2022-01-25 SURGICAL SUPPLY — 15 items
CATARACT SUITE SIGHTPATH (MISCELLANEOUS) ×1 IMPLANT
CLOTH BEACON ORANGE TIMEOUT ST (SAFETY) ×1 IMPLANT
EYE SHIELD UNIVERSAL CLEAR (GAUZE/BANDAGES/DRESSINGS) IMPLANT
FEE CATARACT SUITE SIGHTPATH (MISCELLANEOUS) ×1 IMPLANT
GLOVE BIOGEL PI IND STRL 7.0 (GLOVE) ×2 IMPLANT
GLOVE SS BIOGEL STRL SZ 6.5 (GLOVE) IMPLANT
LENS IOL RAYNER 17.5 (Intraocular Lens) ×1 IMPLANT
LENS IOL RAYONE EMV 17.5 (Intraocular Lens) IMPLANT
NDL HYPO 18GX1.5 BLUNT FILL (NEEDLE) ×1 IMPLANT
NEEDLE HYPO 18GX1.5 BLUNT FILL (NEEDLE) ×1 IMPLANT
PAD ARMBOARD 7.5X6 YLW CONV (MISCELLANEOUS) ×1 IMPLANT
SYR TB 1ML LL NO SAFETY (SYRINGE) ×1 IMPLANT
TAPE SURG TRANSPORE 1 IN (GAUZE/BANDAGES/DRESSINGS) IMPLANT
TAPE SURGICAL TRANSPORE 1 IN (GAUZE/BANDAGES/DRESSINGS) ×1
WATER STERILE IRR 250ML POUR (IV SOLUTION) ×1 IMPLANT

## 2022-01-25 NOTE — Discharge Instructions (Signed)
Please discharge patient when stable, will follow up today with Dr. Clevon Khader at the [facility] office immediately following discharge.  Leave shield in place until visit.  All paperwork with discharge instructions will be given at the office.  [facility] Address:  [address]  [place],  [place] [address]  

## 2022-01-25 NOTE — Anesthesia Preprocedure Evaluation (Signed)
Anesthesia Evaluation  Patient identified by MRN, date of birth, ID band Patient awake    Reviewed: Allergy & Precautions, H&P , NPO status , Patient's Chart, lab work & pertinent test results  Airway Mallampati: II  TM Distance: >3 FB Neck ROM: Full    Dental  (+) Upper Dentures, Lower Dentures, Missing, Chipped   Pulmonary shortness of breath and with exertion, COPD,  COPD inhaler, Current Smoker Left lung cancer   Pulmonary exam normal breath sounds clear to auscultation       Cardiovascular Exercise Tolerance: Good hypertension, Pt. on medications + Peripheral Vascular Disease  Normal cardiovascular exam Rhythm:Regular Rate:Normal     Neuro/Psych  PSYCHIATRIC DISORDERS  Depression    negative neurological ROS     GI/Hepatic Neg liver ROS, hiatal hernia,GERD  Medicated and Controlled,,  Endo/Other  Hypothyroidism    Renal/GU negative Renal ROS  negative genitourinary   Musculoskeletal  (+) Arthritis , Osteoarthritis,    Abdominal   Peds negative pediatric ROS (+)  Hematology negative hematology ROS (+)   Anesthesia Other Findings IMPRESSION: 1. Stable chest CT without evidence of local recurrence or metastatic disease. 2. Stable radiation changes in the right perihilar region with architectural distortion and scarring. 3. Stable small right adrenal nodule from multiple prior studies, most consistent with a small adenoma. 4. Coronary and aortic atherosclerosis (ICD10-I70.0). Emphysema (ICD10-J43.9).     Electronically Signed   By: William  Veazey M.D.   On: 09/23/2021 14:43   Reproductive/Obstetrics negative OB ROS                              Anesthesia Physical Anesthesia Plan  ASA: 3  Anesthesia Plan: MAC   Post-op Pain Management: Minimal or no pain anticipated   Induction:   PONV Risk Score and Plan: 0  Airway Management Planned: Nasal Cannula and Natural  Airway  Additional Equipment:   Intra-op Plan:   Post-operative Plan:   Informed Consent: I have reviewed the patients History and Physical, chart, labs and discussed the procedure including the risks, benefits and alternatives for the proposed anesthesia with the patient or authorized representative who has indicated his/her understanding and acceptance.     Dental advisory given  Plan Discussed with: CRNA and Surgeon  Anesthesia Plan Comments:         Anesthesia Quick Evaluation  

## 2022-01-25 NOTE — Op Note (Signed)
Date of procedure: 01/25/22  Pre-operative diagnosis: Visually significant age-related nuclear cataract, Right Eye (H25.11)  Post-operative diagnosis: Visually significant age-related nuclear cataract, Right Eye  Procedure: Removal of cataract via phacoemulsification and insertion of intra-ocular lens Rayner [id number] +17.5D into the capsular bag of the Right Eye  Attending surgeon: Gerda Diss. Cannie Muckle, MD, MA  Anesthesia: MAC, Topical Akten  Complications: None  Estimated Blood Loss: <39m (minimal)  Specimens: None  Implants: As above  Indications:  Visually significant age-related cataract, Right Eye  Procedure:  The patient was seen and identified in the pre-operative area. The operative eye was identified and dilated.  The operative eye was marked.  Topical anesthesia was administered to the operative eye.     The patient was then to the operative suite and placed in the supine position.  A timeout was performed confirming the patient, procedure to be performed, and all other relevant information.   The patient's face was prepped and draped in the usual fashion for intra-ocular surgery.  A lid speculum was placed into the operative eye and the surgical microscope moved into place and focused.  A superotemporal paracentesis was created using a 20 gauge paracentesis blade.  Shugarcaine was injected into the anterior chamber.  Viscoelastic was injected into the anterior chamber.  A temporal clear-corneal main wound incision was created using a 2.468mmicrokeratome.  A continuous curvilinear capsulorrhexis was initiated using an irrigating cystitome and completed using capsulorrhexis forceps.  Hydrodissection and hydrodeliniation were performed.  Viscoelastic was injected into the anterior chamber.  A phacoemulsification handpiece and a chopper as a second instrument were used to remove the nucleus and epinucleus. The irrigation/aspiration handpiece was used to remove any remaining cortical  material.   The capsular bag was reinflated with viscoelastic, checked, and found to be intact.  The intraocular lens was inserted into the capsular bag.  The irrigation/aspiration handpiece was used to remove any remaining viscoelastic.  The clear corneal wound and paracentesis wounds were then hydrated and checked with Weck-Cels to be watertight. 0.53m54mf moxifloxacin was injected into the anterior chamber. The lid-speculum and drape was removed, and the patient's face was cleaned with a wet and dry 4x4.  A clear shield was taped over the eye. The patient was taken to the post-operative care unit in good condition, having tolerated the procedure well.  Post-Op Instructions: [facility] and will receive all other orders and instructions.

## 2022-01-25 NOTE — Interval H&P Note (Signed)
History and Physical Interval Note:  01/25/2022 1:09 PM  Terry Boyd  has presented today for surgery, with the diagnosis of nuclear sclerosis age related cataract; right.  The various methods of treatment have been discussed with the patient and family. After consideration of risks, benefits and other options for treatment, the patient has consented to  Procedure(s) with comments: CATARACT EXTRACTION PHACO AND INTRAOCULAR LENS PLACEMENT (IOC) (Right) - CDE: as a surgical intervention.  The patient's history has been reviewed, patient examined, no change in status, stable for surgery.  I have reviewed the patient's chart and labs.  Questions were answered to the patient's satisfaction.     Baruch Goldmann

## 2022-01-25 NOTE — Anesthesia Postprocedure Evaluation (Signed)
Anesthesia Post Note  Patient: Terry Boyd  Procedure(s) Performed: CATARACT EXTRACTION PHACO AND INTRAOCULAR LENS PLACEMENT (IOC) (Right: Eye)  Patient location during evaluation: Phase II Anesthesia Type: MAC Level of consciousness: awake and alert and oriented Pain management: pain level controlled Vital Signs Assessment: post-procedure vital signs reviewed and stable Respiratory status: spontaneous breathing, nonlabored ventilation and respiratory function stable Cardiovascular status: blood pressure returned to baseline and stable Postop Assessment: no apparent nausea or vomiting Anesthetic complications: no  No notable events documented.   Last Vitals:  Vitals:   01/25/22 1209 01/25/22 1337  BP: 121/80 132/77  Pulse: 69 70  Resp: 20 20  Temp: 36.6 C 36.7 C  SpO2: 95% 100%    Last Pain:  Vitals:   01/25/22 1337  TempSrc: Oral  PainSc: 0-No pain                 Jayleah Garbers C Parveen Freehling

## 2022-01-25 NOTE — Transfer of Care (Signed)
Immediate Anesthesia Transfer of Care Note  Patient: Terry Boyd  Procedure(s) Performed: CATARACT EXTRACTION PHACO AND INTRAOCULAR LENS PLACEMENT (IOC) (Right: Eye)  Patient Location: PACU  Anesthesia Type:MAC  Level of Consciousness: awake, alert , oriented, and patient cooperative  Airway & Oxygen Therapy: Patient Spontanous Breathing  Post-op Assessment: Report given to RN, Post -op Vital signs reviewed and stable, and Patient moving all extremities X 4  Post vital signs: Reviewed and stable  Last Vitals:  Vitals Value Taken Time  BP    Temp    Pulse    Resp    SpO2      Last Pain:  Vitals:   01/25/22 1209  TempSrc: Oral  PainSc: 0-No pain      Patients Stated Pain Goal: 8 (08/67/61 9509)  Complications: No notable events documented. VSS.

## 2022-01-29 ENCOUNTER — Encounter (HOSPITAL_COMMUNITY): Payer: Self-pay | Admitting: Ophthalmology

## 2022-02-03 ENCOUNTER — Other Ambulatory Visit: Payer: Self-pay

## 2022-02-03 ENCOUNTER — Encounter (HOSPITAL_COMMUNITY)
Admission: RE | Admit: 2022-02-03 | Discharge: 2022-02-03 | Disposition: A | Discharge status: Home / Self Care | Payer: Non-veteran care | Source: Ambulatory Visit | Attending: Ophthalmology | Admitting: Ophthalmology

## 2022-02-03 ENCOUNTER — Encounter (HOSPITAL_COMMUNITY): Payer: Self-pay

## 2022-02-03 NOTE — H&P (Signed)
Surgical History & Physical  Patient Name: Terry Boyd DOB: 10/08/1940  Surgery: Cataract extraction with intraocular lens implant phacoemulsification; Left Eye  Surgeon: Baruch Goldmann MD Surgery Date:  02-08-22 Pre-Op Date:  02-01-22  HPI: A 66 Yr. old male patient 1. The patient is returning after cataract post-op. The right eye is affected. Status post cataract post-op,1 week ago: Since the last visit, the affected area is doing well. The patient's vision is stable. Patient is following postop medication instructions. The patient complains of difficulty when recognizing people/driving, which began 6 months ago. Left eye is affected. The episode is gradual. The condition's severity increased since last visit. Symptoms occur when the patient is driving and outside. The complaint is associated with glare. This is negatively affecting the patient's quality of life and the patient is unable to function adequately in life with the current level of vision. HPI was performed by Baruch Goldmann .  Medical History: Cataracts Cancer Lung Problems Thyroid Problems pt blacks out after moderate cough attacks, stays ...  Review of Systems Negative Allergic/Immunologic Negative Cardiovascular Negative Constitutional Negative Ear, Nose, Mouth & Throat Negative Endocrine Negative Eyes Negative Gastrointestinal Negative Genitourinary Negative Hemotologic/Lymphatic Negative Integumentary Negative Musculoskeletal Negative Neurological Negative Psychiatry Negative Respiratory  Social   Current every day smoker /  Cigars    Alcohol Former Drinker  Medication Artificial Tears, Prednisolone-Moxifloxacin-Bromfenac,  LORazepam, LEVOTHYROXINE SODIUM, EUTHYROX,   Sx/Procedures Eyelid - Entropion Repair by Suture Technique, Phaco c IOL OD,  Stents In Legs,   Drug Allergies   NKDA  History & Physical: Heent: Cataract, left eye NECK: supple without bruits LUNGS: lungs clear to  auscultation CV: regular rate and rhythm Abdomen: soft and non-tender Impression & Plan: Assessment: 1.  CATARACT EXTRACTION STATUS; Right Eye (Z98.41) 2.  COMBINED FORMS AGE RELATED CATARACT; Left Eye (H25.812) 3.  ASTIGMATISM, REGULAR; Both Eyes (H52.223)  Plan: 1.  1 week after cataract surgery. Doing well with improved vision and normal eye pressure. Call with any problems or concerns. Continue Pred-Moxi-Brom 2x/day for 3 more weeks.  2.  Cataract accounts for the patient's decreased vision. This visual impairment is not correctable with a tolerable change in glasses or contact lenses. Cataract surgery with an implantation of a new lens should significantly improve the visual and functional status of the patient. Discussed all risks, benefits, alternatives, and potential complications. Discussed the procedures and recovery. Patient desires to have surgery. A-scan ordered and performed today for intra-ocular lens calculations. The surgery will be performed in order to improve vision for driving, reading, and for eye examinations. Recommend phacoemulsification with intra-ocular lens. Recommend Dextenza for post-operative pain and inflammation. Left Eye. Surgery required to balance vision. Dilates poorly - shugarcaine by protocol. Malyugin Ring. Omidira. Toric Lens.  3.  Recommend Toric IOL OS only. Veteran - waive surgeon's fee.

## 2022-02-08 ENCOUNTER — Encounter (HOSPITAL_COMMUNITY)
Admission: RE | Disposition: A | Discharge status: Home / Self Care | Payer: Self-pay | Source: Home / Self Care | Attending: Ophthalmology

## 2022-02-08 ENCOUNTER — Ambulatory Visit (HOSPITAL_COMMUNITY)
Admission: RE | Admit: 2022-02-08 | Discharge: 2022-02-08 | Disposition: A | Discharge status: Home / Self Care | Payer: Medicare Other | Attending: Ophthalmology | Admitting: Ophthalmology

## 2022-02-08 ENCOUNTER — Ambulatory Visit (HOSPITAL_COMMUNITY): Payer: Medicare Other | Admitting: Certified Registered"

## 2022-02-08 ENCOUNTER — Encounter (HOSPITAL_COMMUNITY): Payer: Self-pay | Admitting: Ophthalmology

## 2022-02-08 ENCOUNTER — Ambulatory Visit (HOSPITAL_BASED_OUTPATIENT_CLINIC_OR_DEPARTMENT_OTHER): Payer: Medicare Other | Admitting: Certified Registered"

## 2022-02-08 DIAGNOSIS — R0602 Shortness of breath: Secondary | ICD-10-CM | POA: Diagnosis not present

## 2022-02-08 DIAGNOSIS — H2512 Age-related nuclear cataract, left eye: Secondary | ICD-10-CM | POA: Diagnosis not present

## 2022-02-08 DIAGNOSIS — J449 Chronic obstructive pulmonary disease, unspecified: Secondary | ICD-10-CM | POA: Diagnosis not present

## 2022-02-08 DIAGNOSIS — K449 Diaphragmatic hernia without obstruction or gangrene: Secondary | ICD-10-CM | POA: Insufficient documentation

## 2022-02-08 DIAGNOSIS — I739 Peripheral vascular disease, unspecified: Secondary | ICD-10-CM | POA: Insufficient documentation

## 2022-02-08 DIAGNOSIS — F17210 Nicotine dependence, cigarettes, uncomplicated: Secondary | ICD-10-CM | POA: Diagnosis not present

## 2022-02-08 DIAGNOSIS — H52202 Unspecified astigmatism, left eye: Secondary | ICD-10-CM

## 2022-02-08 DIAGNOSIS — M1990 Unspecified osteoarthritis, unspecified site: Secondary | ICD-10-CM | POA: Diagnosis not present

## 2022-02-08 DIAGNOSIS — K219 Gastro-esophageal reflux disease without esophagitis: Secondary | ICD-10-CM | POA: Diagnosis not present

## 2022-02-08 DIAGNOSIS — H25812 Combined forms of age-related cataract, left eye: Secondary | ICD-10-CM | POA: Diagnosis not present

## 2022-02-08 DIAGNOSIS — Z9841 Cataract extraction status, right eye: Secondary | ICD-10-CM | POA: Insufficient documentation

## 2022-02-08 DIAGNOSIS — F17290 Nicotine dependence, other tobacco product, uncomplicated: Secondary | ICD-10-CM

## 2022-02-08 DIAGNOSIS — J439 Emphysema, unspecified: Secondary | ICD-10-CM | POA: Diagnosis not present

## 2022-02-08 DIAGNOSIS — I700 Atherosclerosis of aorta: Secondary | ICD-10-CM | POA: Diagnosis not present

## 2022-02-08 DIAGNOSIS — F32A Depression, unspecified: Secondary | ICD-10-CM | POA: Insufficient documentation

## 2022-02-08 DIAGNOSIS — I10 Essential (primary) hypertension: Secondary | ICD-10-CM | POA: Diagnosis not present

## 2022-02-08 DIAGNOSIS — H52223 Regular astigmatism, bilateral: Secondary | ICD-10-CM | POA: Diagnosis not present

## 2022-02-08 DIAGNOSIS — E039 Hypothyroidism, unspecified: Secondary | ICD-10-CM | POA: Insufficient documentation

## 2022-02-08 DIAGNOSIS — H2181 Floppy iris syndrome: Secondary | ICD-10-CM | POA: Insufficient documentation

## 2022-02-08 DIAGNOSIS — H259 Unspecified age-related cataract: Secondary | ICD-10-CM | POA: Diagnosis not present

## 2022-02-08 HISTORY — PX: CATARACT EXTRACTION W/PHACO: SHX586

## 2022-02-08 SURGERY — PHACOEMULSIFICATION, CATARACT, WITH IOL INSERTION
Anesthesia: Monitor Anesthesia Care | Site: Eye | Laterality: Left

## 2022-02-08 MED ORDER — LIDOCAINE HCL (PF) 1 % IJ SOLN
INTRAOCULAR | Status: DC | PRN
  Administered 2022-02-08: 1 mL via OPHTHALMIC

## 2022-02-08 MED ORDER — SODIUM HYALURONATE 23MG/ML IO SOSY
PREFILLED_SYRINGE | INTRAOCULAR | Status: DC | PRN
  Administered 2022-02-08: .6 mL via INTRAOCULAR

## 2022-02-08 MED ORDER — STERILE WATER FOR IRRIGATION IR SOLN
Status: DC | PRN
  Administered 2022-02-08: 250 mL

## 2022-02-08 MED ORDER — TETRACAINE HCL 0.5 % OP SOLN
1.0000 [drp] | OPHTHALMIC | Status: AC | PRN
  Administered 2022-02-08 (×3): 1 [drp] via OPHTHALMIC

## 2022-02-08 MED ORDER — PHENYLEPHRINE HCL 2.5 % OP SOLN
1.0000 [drp] | OPHTHALMIC | Status: AC | PRN
  Administered 2022-02-08 (×3): 1 [drp] via OPHTHALMIC

## 2022-02-08 MED ORDER — MOXIFLOXACIN HCL 5 MG/ML IO SOLN
INTRAOCULAR | Status: AC
  Filled 2022-02-08: qty 1

## 2022-02-08 MED ORDER — SODIUM HYALURONATE 10 MG/ML IO SOLUTION
PREFILLED_SYRINGE | INTRAOCULAR | Status: DC | PRN
  Administered 2022-02-08: .85 mL via INTRAOCULAR

## 2022-02-08 MED ORDER — PHENYLEPHRINE-KETOROLAC 1-0.3 % IO SOLN
INTRAOCULAR | Status: DC | PRN
  Administered 2022-02-08: 500 mL via OPHTHALMIC

## 2022-02-08 MED ORDER — LIDOCAINE HCL 3.5 % OP GEL
1.0000 | Freq: Once | OPHTHALMIC | Status: AC
  Administered 2022-02-08: 1 via OPHTHALMIC

## 2022-02-08 MED ORDER — PHENYLEPHRINE-KETOROLAC 1-0.3 % IO SOLN
INTRAOCULAR | Status: AC
  Filled 2022-02-08: qty 4

## 2022-02-08 MED ORDER — EPINEPHRINE PF 1 MG/ML IJ SOLN
INTRAMUSCULAR | Status: AC
  Filled 2022-02-08: qty 2

## 2022-02-08 MED ORDER — BSS IO SOLN
INTRAOCULAR | Status: DC | PRN
  Administered 2022-02-08: 15 mL via INTRAOCULAR

## 2022-02-08 MED ORDER — TROPICAMIDE 1 % OP SOLN
1.0000 [drp] | OPHTHALMIC | Status: AC | PRN
  Administered 2022-02-08 (×3): 1 [drp] via OPHTHALMIC

## 2022-02-08 MED ORDER — MOXIFLOXACIN HCL 0.5 % OP SOLN
OPHTHALMIC | Status: DC | PRN
  Administered 2022-02-08: .2 mL via OPHTHALMIC

## 2022-02-08 MED ORDER — POVIDONE-IODINE 5 % OP SOLN
OPHTHALMIC | Status: DC | PRN
  Administered 2022-02-08: 1 via OPHTHALMIC

## 2022-02-08 SURGICAL SUPPLY — 16 items
CATARACT SUITE SIGHTPATH (MISCELLANEOUS) ×1 IMPLANT
CLOTH BEACON ORANGE TIMEOUT ST (SAFETY) ×1 IMPLANT
EYE SHIELD UNIVERSAL CLEAR (GAUZE/BANDAGES/DRESSINGS) IMPLANT
FEE CATARACT SUITE SIGHTPATH (MISCELLANEOUS) ×1 IMPLANT
GLOVE BIOGEL PI IND STRL 7.0 (GLOVE) ×2 IMPLANT
GLOVE SURG SS PI 7.0 STRL IVOR (GLOVE) IMPLANT
LENS IOL EYEHANCE 18.0 ×1 IMPLANT
LENS IOL EYHANCE TRC 375 18.0 IMPLANT
NDL HYPO 18GX1.5 BLUNT FILL (NEEDLE) ×1 IMPLANT
NEEDLE HYPO 18GX1.5 BLUNT FILL (NEEDLE) ×1 IMPLANT
PAD ARMBOARD 7.5X6 YLW CONV (MISCELLANEOUS) ×1 IMPLANT
RING MALYGIN 7.0 (MISCELLANEOUS) IMPLANT
SYR TB 1ML LL NO SAFETY (SYRINGE) ×1 IMPLANT
TAPE SURG TRANSPORE 1 IN (GAUZE/BANDAGES/DRESSINGS) IMPLANT
TAPE SURGICAL TRANSPORE 1 IN (GAUZE/BANDAGES/DRESSINGS) ×1
WATER STERILE IRR 250ML POUR (IV SOLUTION) ×1 IMPLANT

## 2022-02-08 NOTE — Anesthesia Postprocedure Evaluation (Signed)
Anesthesia Post Note  Patient: Terry Boyd  Procedure(s) Performed: CATARACT EXTRACTION PHACO AND INTRAOCULAR LENS PLACEMENT (IOC) (Left: Eye)  Patient location during evaluation: Phase II Anesthesia Type: MAC Level of consciousness: awake Pain management: pain level controlled Vital Signs Assessment: post-procedure vital signs reviewed and stable Respiratory status: spontaneous breathing and respiratory function stable Cardiovascular status: blood pressure returned to baseline and stable Postop Assessment: no headache and no apparent nausea or vomiting Anesthetic complications: no Comments: Late entry   No notable events documented.   Last Vitals:  Vitals:   02/08/22 1300 02/08/22 1412  BP: 105/72 129/85  Pulse:  74  Resp:  18  Temp:  36.7 C  SpO2:  100%    Last Pain:  Vitals:   02/08/22 1412  TempSrc: Oral  PainSc: 0-No pain                 Louann Sjogren

## 2022-02-08 NOTE — Anesthesia Preprocedure Evaluation (Signed)
Anesthesia Evaluation  Patient identified by MRN, date of birth, ID band Patient awake    Reviewed: Allergy & Precautions, H&P , NPO status , Patient's Chart, lab work & pertinent test results  Airway Mallampati: II  TM Distance: >3 FB Neck ROM: Full    Dental  (+) Upper Dentures, Lower Dentures, Missing, Chipped   Pulmonary shortness of breath and with exertion, COPD,  COPD inhaler, Current Smoker Left lung cancer   Pulmonary exam normal breath sounds clear to auscultation       Cardiovascular Exercise Tolerance: Good hypertension, Pt. on medications + Peripheral Vascular Disease  Normal cardiovascular exam Rhythm:Regular Rate:Normal     Neuro/Psych  PSYCHIATRIC DISORDERS  Depression    negative neurological ROS     GI/Hepatic Neg liver ROS, hiatal hernia,GERD  Medicated and Controlled,,  Endo/Other  Hypothyroidism    Renal/GU negative Renal ROS  negative genitourinary   Musculoskeletal  (+) Arthritis , Osteoarthritis,    Abdominal   Peds negative pediatric ROS (+)  Hematology negative hematology ROS (+)   Anesthesia Other Findings IMPRESSION: 1. Stable chest CT without evidence of local recurrence or metastatic disease. 2. Stable radiation changes in the right perihilar region with architectural distortion and scarring. 3. Stable small right adrenal nodule from multiple prior studies, most consistent with a small adenoma. 4. Coronary and aortic atherosclerosis (ICD10-I70.0). Emphysema (ICD10-J43.9).     Electronically Signed   By: Richardean Sale M.D.   On: 09/23/2021 14:43   Reproductive/Obstetrics negative OB ROS                              Anesthesia Physical Anesthesia Plan  ASA: 3  Anesthesia Plan: MAC   Post-op Pain Management: Minimal or no pain anticipated   Induction:   PONV Risk Score and Plan: 0  Airway Management Planned: Nasal Cannula and Natural  Airway  Additional Equipment:   Intra-op Plan:   Post-operative Plan:   Informed Consent: I have reviewed the patients History and Physical, chart, labs and discussed the procedure including the risks, benefits and alternatives for the proposed anesthesia with the patient or authorized representative who has indicated his/her understanding and acceptance.     Dental advisory given  Plan Discussed with: CRNA and Surgeon  Anesthesia Plan Comments:         Anesthesia Quick Evaluation

## 2022-02-08 NOTE — Op Note (Signed)
Date of procedure: 02/08/22  Pre-operative diagnosis: Visually significant age-related cataract, Left Eye; Visually Significant Astigmatism, Left Eye (H25.?2) Poor dilation, left eye  Post-operative diagnosis: Visually significant age-related cataract, Left Eye; Visually Significant Astigmatism, Left Eye Intra-operative floppy iris syndrome, left eye  Procedure: Complex Removal of cataract via phacoemulsification and insertion of intra-ocular lens Wynetta Emery and Johnson [id number] +18.0D into the capsular bag of the Left Eye  Attending surgeon: Gerda Diss. Arlinda Barcelona, MD, MA  Anesthesia: MAC, Topical Akten  Complications: None  Estimated Blood Loss: <21m (minimal)  Specimens: None  Implants: As above  Indications:  Visually significant age-related cataract, Left Eye; Visually Significant Astigmatism, Left Eye  Procedure:  The patient was seen and identified in the pre-operative area. The operative eye was identified and dilated.  The operative eye was marked.  Pre-operative toric markers were used to mark the eye at 0 and 180 degrees. Topical anesthesia was administered to the operative eye.     The patient was then to the operative suite and placed in the supine position.  A timeout was performed confirming the patient, procedure to be performed, and all other relevant information.   The patient's face was prepped and draped in the usual fashion for intra-ocular surgery.  A lid speculum was placed into the operative eye and the surgical microscope moved into place and focused.  A superotemporal paracentesis was created using a 20 gauge paracentesis blade.  Shugarcaine was injected into the anterior chamber.  Viscoelastic was injected into the anterior chamber.  A temporal clear-corneal main wound incision was created using a 2.461mmicrokeratome. A 7.26m56malyugin ring was placed. A continuous curvilinear capsulorrhexis was initiated using an irrigating cystitome and completed using capsulorrhexis  forceps.  Hydrodissection and hydrodeliniation were performed.  Viscoelastic was injected into the anterior chamber.  A phacoemulsification handpiece and a chopper as a second instrument were used to remove the nucleus and epinucleus. The irrigation/aspiration handpiece was used to remove any remaining cortical material.   The capsular bag was reinflated with viscoelastic, checked, and found to be intact.  The eye was marked to the per-op meridian.  The intraocular lens was inserted into the capsular bag and dialed into place using a Kuglen hook to 1 degrees. The Malyugin ring was removed. The irrigation/aspiration handpiece was used to remove any remaining viscoelastic.  The clear corneal wound and paracentesis wounds were then hydrated and checked with Weck-Cels to be watertight. 0.1mL54m moxifloxacin was injected into the anterior chamber. The lid-speculum and drape was removed, and the patient's face was cleaned with a wet and dry 4x4.   A clear shield was taped over the eye. The patient was taken to the post-operative care unit in good condition, having tolerated the procedure well.  Post-Op Instructions: The patient will [facility] a same day post-operative evaluation and will receive all other orders and instructions.

## 2022-02-08 NOTE — Discharge Instructions (Signed)
Please discharge patient when stable, will follow up today with Dr. Azaryah Heathcock at the [facility] [place] office immediately following discharge.  Leave shield in place until visit.  All paperwork with discharge instructions will be given at the office.  [facility] Address:  [address]  [place], [place] [address]  

## 2022-02-08 NOTE — Transfer of Care (Signed)
Immediate Anesthesia Transfer of Care Note  Patient: Terry Boyd  Procedure(s) Performed: CATARACT EXTRACTION PHACO AND INTRAOCULAR LENS PLACEMENT (IOC) (Left: Eye)  Patient Location: PACU  Anesthesia Type:MAC  Level of Consciousness: awake  Airway & Oxygen Therapy: Patient Spontanous Breathing  Post-op Assessment: Report given to RN and Post -op Vital signs reviewed and stable  Post vital signs: Reviewed and stable  Last Vitals:  Vitals Value Taken Time  BP    Temp    Pulse    Resp    SpO2      Last Pain:  Vitals:   02/08/22 1248  TempSrc: Oral  PainSc: 0-No pain      Patients Stated Pain Goal: 8 (40/98/11 [id number])  Complications: No notable events documented.

## 2022-02-08 NOTE — Interval H&P Note (Signed)
History and Physical Interval Note:  02/08/2022 1:45 PM  Terry Boyd  has presented today for surgery, with the diagnosis of combined forms age related cataract; left.  The various methods of treatment have been discussed with the patient and family. After consideration of risks, benefits and other options for treatment, the patient has consented to  Procedure(s) with comments: CATARACT EXTRACTION PHACO AND INTRAOCULAR LENS PLACEMENT ([place]) (Left) - CDE as a surgical intervention.  The patient's history has been reviewed, patient examined, no change in status, stable for surgery.  I have reviewed the patient's chart and labs.  Questions were answered to the patient's satisfaction.     Baruch Goldmann

## 2022-02-08 NOTE — Anesthesia Procedure Notes (Signed)
Procedure Name: MAC Date/Time: 02/08/2022 1:51 PM  Performed by: Lieutenant Diego, CRNAPre-anesthesia Checklist: Patient identified, Emergency Drugs available, Suction available, Patient being monitored and Timeout performed Patient Re-evaluated:Patient Re-evaluated prior to induction Oxygen Delivery Method: Nasal cannula Preoxygenation: Pre-oxygenation with 100% oxygen

## 2022-02-10 ENCOUNTER — Encounter (HOSPITAL_COMMUNITY): Payer: Self-pay | Admitting: Ophthalmology

## 2022-03-18 ENCOUNTER — Other Ambulatory Visit: Payer: Self-pay

## 2022-03-18 DIAGNOSIS — C3490 Malignant neoplasm of unspecified part of unspecified bronchus or lung: Secondary | ICD-10-CM

## 2022-03-18 MED ORDER — LORAZEPAM 1 MG PO TABS: 1.0000 mg | ORAL_TABLET | Freq: Two times a day (BID) | ORAL | 3 refills | Status: DC

## 2022-04-01 ENCOUNTER — Ambulatory Visit (HOSPITAL_COMMUNITY)
Admission: RE | Admit: 2022-04-01 | Discharge: 2022-04-01 | Disposition: A | Discharge status: Home / Self Care | Payer: Medicare Other | Source: Ambulatory Visit | Attending: Hematology | Admitting: Hematology

## 2022-04-01 ENCOUNTER — Inpatient Hospital Stay: Payer: Medicare Other | Attending: Hematology

## 2022-04-01 DIAGNOSIS — Z79899 Other long term (current) drug therapy: Secondary | ICD-10-CM | POA: Diagnosis not present

## 2022-04-01 DIAGNOSIS — C3490 Malignant neoplasm of unspecified part of unspecified bronchus or lung: Secondary | ICD-10-CM

## 2022-04-01 DIAGNOSIS — F17210 Nicotine dependence, cigarettes, uncomplicated: Secondary | ICD-10-CM | POA: Insufficient documentation

## 2022-04-01 DIAGNOSIS — R918 Other nonspecific abnormal finding of lung field: Secondary | ICD-10-CM | POA: Diagnosis not present

## 2022-04-01 DIAGNOSIS — R296 Repeated falls: Secondary | ICD-10-CM | POA: Diagnosis not present

## 2022-04-01 DIAGNOSIS — R634 Abnormal weight loss: Secondary | ICD-10-CM | POA: Diagnosis not present

## 2022-04-01 LAB — CBC WITH DIFFERENTIAL/PLATELET
Abs Immature Granulocytes: 0.06 10*3/uL (ref 0.00–0.07)
Basophils Absolute: 0.1 10*3/uL (ref 0.0–0.1)
Basophils Relative: 1 %
Eosinophils Absolute: 0.2 10*3/uL (ref 0.0–0.5)
Eosinophils Relative: 2 %
HCT: 43.8 % (ref 39.0–52.0)
Hemoglobin: 13.6 g/dL (ref 13.0–17.0)
Immature Granulocytes: 1 %
Lymphocytes Relative: 11 %
Lymphs Abs: 1 10*3/uL (ref 0.7–4.0)
MCH: 29.2 pg (ref 26.0–34.0)
MCHC: 31.1 g/dL (ref 30.0–36.0)
MCV: 94 fL (ref 80.0–100.0)
Monocytes Absolute: 0.5 10*3/uL (ref 0.1–1.0)
Monocytes Relative: 5 %
Neutro Abs: 7.9 10*3/uL — ABNORMAL HIGH (ref 1.7–7.7)
Neutrophils Relative %: 80 %
Platelets: 222 10*3/uL (ref 150–400)
RBC: 4.66 MIL/uL (ref 4.22–5.81)
RDW: 14.1 % (ref 11.5–15.5)
WBC: 9.8 10*3/uL (ref 4.0–10.5)
nRBC: 0 % (ref 0.0–0.2)

## 2022-04-01 LAB — COMPREHENSIVE METABOLIC PANEL
ALT: 19 U/L (ref 0–44)
AST: 18 U/L (ref 15–41)
Albumin: 3.6 g/dL (ref 3.5–5.0)
Alkaline Phosphatase: 72 U/L (ref 38–126)
Anion gap: 10 (ref 5–15)
BUN: 15 mg/dL (ref 8–23)
CO2: 24 mmol/L (ref 22–32)
Calcium: 8.2 mg/dL — ABNORMAL LOW (ref 8.9–10.3)
Chloride: 104 mmol/L (ref 98–111)
Creatinine, Ser: 0.97 mg/dL (ref 0.61–1.24)
GFR, Estimated: >60 mL/min (ref >60)
Glucose, Bld: 139 mg/dL — ABNORMAL HIGH (ref 70–99)
Potassium: 4 mmol/L (ref 3.5–5.1)
Sodium: 138 mmol/L (ref 135–145)
Total Bilirubin: 0.5 mg/dL (ref 0.3–1.2)
Total Protein: 7.5 g/dL (ref 6.5–8.1)

## 2022-04-01 MED ORDER — IOHEXOL 300 MG/ML  SOLN
75.0000 mL | Freq: Once | INTRAMUSCULAR | Status: AC | PRN
  Administered 2022-04-01: 75 mL via INTRAVENOUS

## 2022-04-08 ENCOUNTER — Inpatient Hospital Stay (HOSPITAL_BASED_OUTPATIENT_CLINIC_OR_DEPARTMENT_OTHER): Payer: Medicare Other | Admitting: Hematology

## 2022-04-08 ENCOUNTER — Encounter: Payer: Self-pay | Admitting: Hematology

## 2022-04-08 VITALS — BP 155/89 | HR 86 | Temp 98.3°F | Resp 18 | Wt 150.9 lb

## 2022-04-08 DIAGNOSIS — C3490 Malignant neoplasm of unspecified part of unspecified bronchus or lung: Secondary | ICD-10-CM | POA: Diagnosis not present

## 2022-04-08 DIAGNOSIS — R634 Abnormal weight loss: Secondary | ICD-10-CM | POA: Diagnosis not present

## 2022-04-08 DIAGNOSIS — Z79899 Other long term (current) drug therapy: Secondary | ICD-10-CM | POA: Diagnosis not present

## 2022-04-08 DIAGNOSIS — F17210 Nicotine dependence, cigarettes, uncomplicated: Secondary | ICD-10-CM | POA: Diagnosis not present

## 2022-04-08 DIAGNOSIS — R296 Repeated falls: Secondary | ICD-10-CM | POA: Diagnosis not present

## 2022-04-08 NOTE — Progress Notes (Signed)
Terry Boyd, [place] [id number]   CLINIC:  Medical Oncology/Hematology  PCP:  Kissee Mills Nation, MD [address] / [place] [address] [phone]   REASON FOR VISIT:  Follow-up for limited stage small cell lung cancer  PRIOR THERAPY: Carboplatin and etoposide x 4 cycles from 01/18/2018 to 03/29/2018  NGS Results: not done  CURRENT THERAPY: surveillance  BRIEF ONCOLOGIC HISTORY:  Oncology History  Small cell lung cancer (Terry Boyd)  12/30/2017 Initial Diagnosis   Small cell lung cancer (Terry Boyd)   01/18/2018 - 03/29/2018 Chemotherapy   The patient had palonosetron (ALOXI) injection 0.25 mg, 0.25 mg, Intravenous,  Once, 4 of 4 cycles Administration: 0.25 mg (01/18/2018), 0.25 mg (02/08/2018), 0.25 mg (03/06/2018), 0.25 mg (03/27/2018) CARBOplatin (PARAPLATIN) 300 mg in sodium chloride 0.9 % 250 mL chemo infusion, 300 mg (100 % of original dose 300 mg), Intravenous,  Once, 4 of 4 cycles Dose modification: 300 mg (original dose 300 mg, Cycle 1), 409.5 mg (original dose 409.5 mg, Cycle 2),   (original dose 409.5 mg, Cycle 3), 409.5 mg (original dose 409.5 mg, Cycle 4) Administration: 300 mg (01/18/2018), 410 mg (02/08/2018), 410 mg (03/06/2018), 410 mg (03/27/2018) etoposide (VEPESID) 160 mg in sodium chloride 0.9 % 500 mL chemo infusion, 90 mg/m2 = 160 mg (90 % of original dose 100 mg/m2), Intravenous,  Once, 4 of 4 cycles Dose modification: 90 mg/m2 (90 % of original dose 100 mg/m2, Cycle 1, Reason: Patient Age) Administration: 160 mg (01/18/2018), 170 mg (01/19/2018), 170 mg (02/10/2018), 170 mg (02/08/2018), 170 mg (02/09/2018), 170 mg (01/20/2018), 170 mg (03/07/2018), 170 mg (03/06/2018), 170 mg (03/08/2018), 170 mg (03/27/2018), 170 mg (03/28/2018), 170 mg (03/29/2018) fosaprepitant (EMEND) 150 mg, dexamethasone (DECADRON) 12 mg in sodium chloride 0.9 % 145 mL IVPB, , Intravenous,  Once, 4 of 4 cycles Administration:  (01/18/2018),  (02/08/2018),  (03/06/2018),   (03/27/2018)  for chemotherapy treatment.      CANCER STAGING:  Cancer Staging  No matching staging information was found for the patient.  INTERVAL HISTORY:  Terry Boyd, a 82 y.o. male, returns for routine follow-up of his limited stage small cell lung cancer. He was last seen by me on 09/30/21.  Patient was seen in the ED on 11/08/21 for UTI treatment, and was started on Keflex at the time of discharge home. He was seen again on 11/17/21 for increasing falls. He had a CT head and CT c-spine at that time which did not reveal any acute findings. Patient was treated again in the ED on 01/04/22 for forearm abrasions secondary to his falls.  He has since had cataract extractions in 12/2021 and 01/2022.  Today, he states that he is doing okay overall. His appetite level is at 50%. His energy level is at 25%. He denies any pain. He lost 15 lbs unintentionally since October. His eating is unchanged since his last visit, eating 1 meal per day. He has had no pneumonia or other infections, fever, or night sweats. He reports that he loses his balance occasionally. He fell approximately 3-4 times. He denies any headache or vision changes. He smokes 10 cigarettes per day.  His cough is unchanged.    REVIEW OF SYSTEMS:  Review of Systems  Constitutional:  Negative for chills, fatigue and fever.  HENT:   Negative for lump/mass, mouth sores, nosebleeds, sore throat and trouble swallowing.   Eyes:  Negative for eye problems.  Respiratory:  Positive for cough. Negative for shortness  of breath.   Cardiovascular:  Negative for chest pain, leg swelling and palpitations.  Gastrointestinal:  Negative for abdominal pain, constipation, diarrhea, nausea and vomiting.  Genitourinary:  Negative for bladder incontinence, difficulty urinating, dysuria, frequency, hematuria and nocturia.   Musculoskeletal:  Negative for arthralgias, back pain, flank pain, myalgias and neck pain.  Skin:  Negative for itching and  rash.  Neurological:  Negative for dizziness, headaches and numbness.       + balance issues, falls  Hematological:  Does not bruise/bleed easily.  Psychiatric/Behavioral:  Negative for depression, sleep disturbance and suicidal ideas. The patient is not nervous/anxious.   All other systems reviewed and are negative.   PAST MEDICAL/SURGICAL HISTORY:  Past Medical History:  Diagnosis Date   AAA (abdominal aortic aneurysm) ([facility])    EVAR in 2011 by Dr. Oneida Alar   Arthritis    Chronic back pain    COPD (chronic obstructive pulmonary disease) ([facility])    Depression    Essential hypertension    GERD (gastroesophageal reflux disease)    History of hiatal hernia    Hypothyroidism    Peripheral vascular disease ([facility])    Small cell lung cancer ([facility])    Carboplatin and etoposide x 4 cycles from 01/18/2018 to 03/29/2018   Past Surgical History:  Procedure Laterality Date   ABDOMINAL AORTAGRAM N/A 03/14/2012   Procedure: ABDOMINAL Maxcine Ham;  Surgeon: Serafina Mitchell, MD;  Location: [facility] CATH LAB;  Service: Cardiovascular;  Laterality: N/A;   ABDOMINAL AORTIC ANEURYSM REPAIR  04-22-2009   Stent graft repair of AAA   APPENDECTOMY     BRONCHIAL BRUSHINGS Right 11/30/2017   Procedure: BRONCHIAL BRUSHINGS;  Surgeon: Sinda Du, MD;  Location: [facility] SU[facility];  Service: Cardiopulmonary;  Laterality: Right;   BRONCHIAL WASHINGS Right 11/30/2017   Procedure: BRONCHIAL WASHINGS;  Surgeon: Sinda Du, MD;  [facility]: [facility];  Service: Cardiopulmonary;  Laterality: Right;   BYPASS GRAFT POPLITEAL TO POPLITEAL  03/20/2012   Procedure: BYPASS GRAFT POPLITEAL TO POPLITEAL;  Surgeon: Elam Dutch, MD;  Location: [facility];  Service: Vascular;  Laterality: Left;  ABOVE THE KNEE POPLITEAL ARTERY TO BELOW THE KNEE POPLITEAL ARTERY BYPASS GRAFT USING REVERSE LEFT GREATER SAPHENOUS VEIN    CATARACT EXTRACTION W/PHACO Right 01/25/2022   Procedure: CATARACT EXTRACTION PHACO AND INTRAOCULAR LENS PLACEMENT  ([place]);  Surgeon: Baruch Goldmann, MD;  Location: [facility] ORS;  Service: Ophthalmology;  Laterality: Right;  CDE: 7.80   CATARACT EXTRACTION W/PHACO Left 02/08/2022   Procedure: CATARACT EXTRACTION PHACO AND INTRAOCULAR LENS PLACEMENT (IOC);  Surgeon: Baruch Goldmann, MD;  Location: [facility];  Service: Ophthalmology;  Laterality: Left;  CDE 9.55   ENDARTERECTOMY POPLITEAL  03/20/2012   Procedure: ENDARTERECTOMY POPLITEAL;  Surgeon: Elam Dutch, MD;  Location: [facility] OR;  Service: Vascular;  Laterality: Left;   LIGATION OF LEFT LEG POPLITEAL ANEURYSM   ESOPHAGOGASTRODUODENOSCOPY  09/28/2017   Dr. Lurlean Nanny: Medium sized hiatal hernia, residual food.  Suspect gastroparesis.  Gastric atony.   ESOPHAGOGASTRODUODENOSCOPY  01/05/2018   [place].  Large amount of solid food upon entry to the stomach.  Pylorus appeared patent.  Normal duodenal mucosa without evidence of obstruction to the second portion of duodenum.  Balloon dilation performed of the pylorus.   FLEXIBLE BRONCHOSCOPY N/A 11/30/2017   Procedure: FLEXIBLE BRONCHOSCOPY;  Surgeon: Sinda Du, MD;  [facility];  Service: Cardiopulmonary;  Laterality: N/A;   INTRAOPERATIVE ARTERIOGRAM  03/20/2012   Procedure: INTRA OPERATIVE ARTERIOGRAM;  Surgeon: Elam Dutch,  MD;  Location: [facility];  Service: Vascular;  Laterality: Left;  TIMES ONE   PORTACATH PLACEMENT Left 01/06/2018   Procedure: INSERTION PORT-A-CATH;  Surgeon: Aviva Signs, MD;  Location: [facility];  Service: General;  Laterality: Left;   [place] N/A 12/23/2017   Procedure: VIDEO BRONCHOSCOPY WITH ENDOBRONCHIAL ULTRASOUND;  Surgeon: Melrose Nakayama, MD;  Location: [facility];  Service: Thoracic;  Laterality: N/A;    SOCIAL HISTORY:  Social History   Socioeconomic History   Marital status: Divorced    Spouse name: Not on file   Number of children: 2   Years of education: Not on file   Highest education level:  Not on file  Occupational History   Occupation: [place]     Comment: [place] [employer] exposure   Occupation: Sales executive    Comment: [place] exsposure  Tobacco Use   Smoking status: Every Day    Types: Cigars   Smokeless tobacco: Never  Vaping Use   Vaping Use: Never used  Substance and Sexual Activity   Alcohol use: No   Drug use: No   Sexual activity: Not on file  Other Topics Concern   Not on file  Social History Narrative   Sedentary   Social Determinants of Health   Financial Resource Strain: Low Risk  (03/24/2020)   Overall Financial Resource Strain (CARDIA)    Difficulty of Paying Living Expenses: Not very hard  Food Insecurity: No Food Insecurity (03/24/2020)   Hunger Vital Sign    Worried About Running Out of Food in the Last Year: Never true    Ran Out of Food in the Last Year: Never true  Transportation Needs: No Transportation Needs (03/24/2020)   PRAPARE - Hydrologist (Medical): No    Lack of Transportation (Non-Medical): No  Physical Activity: Inactive (03/24/2020)   Exercise Vital Sign    Days of Exercise per Week: 0 days    Minutes of Exercise per Session: 0 min  Stress: No Stress Concern Present (03/24/2020)   [place]    Feeling of Stress : Not at all  Social Connections: Moderately Isolated (03/24/2020)   Social Connection and Isolation Panel [NHANES]    Frequency of Communication with Friends and Family: Three times a week    Frequency of Social Gatherings with Friends and Family: Twice a week    Attends Religious Services: 1 to 4 times per year    Active Member of [employer] or Organizations: No    Attends Archivist Meetings: Never    Marital Status: Divorced  Human resources officer Violence: Not At Risk (03/24/2020)   Humiliation, Afraid, Rape, and Kick questionnaire    Fear of Current or Ex-Partner: No    Emotionally Abused: No    Physically Abused:  No    Sexually Abused: No    FAMILY HISTORY:  Family History  Problem Relation Age of Onset   Diabetes Mother    Stroke Mother    Heart disease Father    Diabetes Father    Heart disease Sister        CABG x 4   Cancer Brother        throat cancer   Diabetes Sister    Heart disease Sister    Cancer Brother        pancreatic   Prostate cancer Brother    Kidney disease Son  Colon cancer Neg Hx     CURRENT MEDICATIONS:  Current Outpatient Medications  Medication Sig Dispense Refill   Ipratropium-Albuterol (COMBIVENT) 20-100 MCG/ACT AERS respimat Inhale 1 puff into the lungs every 6 (six) hours as needed for wheezing.     levothyroxine (SYNTHROID) 137 MCG tablet Take 137 mcg by mouth every other day. Alternates with 150 mcg     LORazepam (ATIVAN) 1 MG tablet Take 1 tablet (1 mg total) by mouth 2 (two) times daily. 60 tablet 3   ondansetron (ZOFRAN-ODT) 4 MG disintegrating tablet Take 4 mg by mouth every 8 (eight) hours as needed.     sertraline (ZOLOFT) 50 MG tablet Take 50 mg by mouth daily.     No current facility-administered medications for this visit.    ALLERGIES:  Allergies  Allergen Reactions   Morphine And Related Shortness Of Breath and Other (See Comments)    Hallucinations pt has taken hydromorphone before    PHYSICAL EXAM:  Performance status (ECOG): 1 - Symptomatic but completely ambulatory  Vitals:   04/08/22 1421  BP: (!) 155/89  Pulse: 86  Resp: 18  Temp: 98.3 F (36.8 C)  SpO2: 95%   Wt Readings from Last 3 Encounters:  04/08/22 68.4 kg (150 lb 14.4 oz)  02/08/22 74 kg (163 lb 2.3 oz)  02/03/22 74 kg (163 lb 2.3 oz)   Physical Exam Vitals and nursing note reviewed. Exam conducted with a chaperone present.  Constitutional:      Appearance: Normal appearance.  Cardiovascular:     Rate and Rhythm: Normal rate and regular rhythm.     Pulses: Normal pulses.     Heart sounds: Normal heart sounds.  Pulmonary:     Effort: Pulmonary effort  is normal.     Breath sounds: Normal breath sounds.  Abdominal:     Palpations: Abdomen is soft. There is no hepatomegaly, splenomegaly or mass.     Tenderness: There is no abdominal tenderness.  Lymphadenopathy:     Cervical: No cervical adenopathy.     Right cervical: No superficial cervical adenopathy.    Left cervical: No superficial cervical adenopathy.  Neurological:     General: No focal deficit present.     Mental Status: He is alert and oriented to person, place, and time.  Psychiatric:        Mood and Affect: Mood normal.        Behavior: Behavior normal.      LABORATORY DATA:  I have reviewed the labs as listed.     Latest Ref Rng & Units 04/01/2022   10:35 AM 09/23/2021    9:14 AM 03/23/2021    9:04 AM  CBC  WBC 4.0 - 10.5 K/uL 9.8  7.7  7.8   Hemoglobin 13.0 - 17.0 g/dL 13.6  14.6  14.9   Hematocrit 39.0 - 52.0 % 43.8  46.2  46.1   Platelets 150 - 400 K/uL 222  149  143       Latest Ref Rng & Units 04/01/2022   10:35 AM 09/23/2021    9:14 AM 03/23/2021    9:04 AM  CMP  Glucose 70 - 99 mg/dL 139  141  151   BUN 8 - 23 mg/dL 15  12  16    Creatinine 0.61 - 1.24 mg/dL 0.97  1.13  1.05   Sodium 135 - 145 mmol/L 138  138  135   Potassium 3.5 - 5.1 mmol/L 4.0  4.7  4.0   Chloride 98 -  111 mmol/L 104  100  99   CO2 22 - 32 mmol/L 24  29  28    Calcium 8.9 - 10.3 mg/dL 8.2  8.9  8.5   Total Protein 6.5 - 8.1 g/dL 7.5  7.7  7.4   Total Bilirubin 0.3 - 1.2 mg/dL 0.5  0.8  0.7   Alkaline Phos 38 - 126 U/L 72  66  76   AST 15 - 41 U/L 18  17  14    ALT 0 - 44 U/L 19  13  11      DIAGNOSTIC IMAGING:  I have independently reviewed the scans and discussed with the patient. CT Chest W Contrast  Result Date: 04/01/2022 CLINICAL DATA:  Small cell lung cancer; * Tracking Code: BO * EXAM: CT CHEST WITH CONTRAST TECHNIQUE: Multidetector CT imaging of the chest was performed during intravenous contrast administration. RADIATION DOSE REDUCTION: This exam was performed according to  the departmental dose-optimization program which includes automated exposure control, adjustment of the mA and/or kV according to patient size and/or use of iterative reconstruction technique. CONTRAST:  35mL OMNIPAQUE IOHEXOL 300 MG/ML  SOLN COMPARISON:  Multiple priors, most recent chest CT dated September 23, 2021 FINDINGS: Cardiovascular: Heart size. Trace pericardial effusion, similar to prior. Severe coronary artery calcifications. Normal caliber thoracic aorta with severe atherosclerotic disease. Mediastinum/Nodes: Mild soft tissue thickening of the right-greater-than-left bilateral hila, unchanged when compared with the prior exam. No pathologically enlarged lymph nodes seen in the chest. Patulous and fluid-filled esophagus, similar to prior exam. Lungs/Pleura: Central airways are patent. New left lower lobe bronchial wall thickening. Moderate centrilobular emphysema. Unchanged wall thickening of the lower trachea and bilateral mainstem bronchi, likely posttreatment change. No pleural effusion pneumothorax. Mild linear opacities of the left lower lobe, likely due to scarring or atelectasis. No suspicious pulmonary nodules. Upper Abdomen: Stable right adrenal gland adenoma, no specific follow-up imaging is recommended. Musculoskeletal: No chest wall abnormality. No acute or significant osseous findings. IMPRESSION: 1. No evidence of recurrent or metastatic disease. 2. New left lower lobe bronchial wall thickening, likely sequela of aspiration. 3. Aortic Atherosclerosis (ICD10-I70.0) and Emphysema (ICD10-J43.9). Electronically Signed   By: Yetta Glassman M.D.   On: 04/01/2022 22:45     ASSESSMENT:  1.  Limited stage small cell lung cancer: -Chemoradiation therapy with carboplatin and etoposide 4 cycles from 01/18/2018 through 03/27/2018. -PCI was declined. -CT chest with contrast on 10/16/2019 did not show any residual or recurrent tumor or metastatic disease.  Stable right adrenal gland nodule. -CT head  with and without contrast on 10/16/2019 was negative for brain meta stasis.   PLAN:  1.  Limited stage small cell lung cancer: - Baseline breathing and cough is stable.  He continues to smoke 10 cigarettes/day. - He lost 15 pounds since last visit even though his eating has been normal.  He is also having recurrent falls and was evaluated in the ER.  Denies any headaches or vision changes. - CT chest on 04/01/2022: No evidence of recurrence or metastatic disease.  New left lower lobe bronchial wall thickening, likely sequela of aspiration. - Labs from 04/01/2022 shows normal LFTs and creatinine.  CBC was grossly normal. - Due to his significant weight loss, I have recommended CTAP with contrast and brain MRI with and without contrast. - RTC Plastibell.   Orders placed this encounter:  Orders Placed This Encounter  Procedures   MR Brain W Wo Contrast   CT Abdomen Pelvis W Contrast     I,Alexis  Herring,acting as a Education administrator for Derek Jack, MD.,have documented all relevant documentation on the behalf of Derek Jack, MD,as directed by  Derek Jack, MD while in the presence of Derek Jack, MD.  I, Derek Jack MD, have reviewed the above documentation for accuracy and completeness, and I agree with the above.   Derek Jack, MD [place] [phone]

## 2022-04-08 NOTE — Patient Instructions (Signed)
[facility]  Discharge Instructions  You were seen and examined today by Dr. Delton Coombes.  Dr. Delton Coombes discussed your most recent lab work and CT scan which revealed that everything looks good. You have lost weight recently without trying and have been falling more frequently. Dr. Delton Coombes wants to get a brain scan and CT of abdomen and pelvis to make sure that the cancer has not spread.  Follow-up as scheduled after scans.    Thank you for choosing [facility] to provide your oncology and hematology care.   To afford each patient quality time with our provider, please arrive at least 15 minutes before your scheduled appointment time. You may need to reschedule your appointment if you arrive late (10 or more minutes). Arriving late affects you and other patients whose appointments are after yours.  Also, if you miss three or more appointments without notifying the office, you may be dismissed from the clinic at the provider's discretion.    Again, thank you for choosing [facility].  Our hope is that these requests will decrease the amount of time that you wait before being seen by our physicians.   If you have a lab appointment with the [facility] please come in thru the Main Entrance and check in at the main information desk.           _____________________________________________________________  Should you have questions after your visit to [facility], please contact our office at [phone] and follow the prompts.  Our office hours are 8:00 a.m. to 4:30 p.m. Monday - Thursday and 8:00 a.m. to 2:30 p.m. Friday.  Please note that voicemails left after 4:00 p.m. may not be returned until the following business day.  We are closed weekends and all major holidays.  You do have access to a nurse 24-7, just call the main number to the clinic [phone] and do not press any options, hold on the line and  a nurse will answer the phone.    For prescription refill requests, have your pharmacy contact our office and allow 72 hours.    Masks are optional in the cancer centers. If you would like for your care team to wear a mask while they are taking care of you, please let them know. You may have one support person who is at least 80 years old accompany you for your appointments.

## 2022-04-19 DIAGNOSIS — Z6822 Body mass index (BMI) 22.0-22.9, adult: Secondary | ICD-10-CM | POA: Diagnosis not present

## 2022-04-19 DIAGNOSIS — M25569 Pain in unspecified knee: Secondary | ICD-10-CM | POA: Diagnosis not present

## 2022-04-20 DIAGNOSIS — M79662 Pain in left lower leg: Secondary | ICD-10-CM | POA: Diagnosis not present

## 2022-04-20 DIAGNOSIS — I7092 Chronic total occlusion of artery of the extremities: Secondary | ICD-10-CM | POA: Diagnosis not present

## 2022-04-20 DIAGNOSIS — Z955 Presence of coronary angioplasty implant and graft: Secondary | ICD-10-CM | POA: Diagnosis not present

## 2022-04-20 DIAGNOSIS — I724 Aneurysm of artery of lower extremity: Secondary | ICD-10-CM | POA: Diagnosis not present

## 2022-04-20 DIAGNOSIS — R030 Elevated blood-pressure reading, without diagnosis of hypertension: Secondary | ICD-10-CM | POA: Diagnosis not present

## 2022-04-20 DIAGNOSIS — I7090 Unspecified atherosclerosis: Secondary | ICD-10-CM | POA: Diagnosis not present

## 2022-04-20 DIAGNOSIS — Z6822 Body mass index (BMI) 22.0-22.9, adult: Secondary | ICD-10-CM | POA: Diagnosis not present

## 2022-04-20 DIAGNOSIS — I771 Stricture of artery: Secondary | ICD-10-CM | POA: Diagnosis not present

## 2022-04-20 DIAGNOSIS — I70793 Other atherosclerosis of other type of bypass graft(s) of the extremities, bilateral legs: Secondary | ICD-10-CM | POA: Diagnosis not present

## 2022-04-20 DIAGNOSIS — I714 Abdominal aortic aneurysm, without rupture, unspecified: Secondary | ICD-10-CM | POA: Diagnosis not present

## 2022-04-20 DIAGNOSIS — I708 Atherosclerosis of other arteries: Secondary | ICD-10-CM | POA: Diagnosis not present

## 2022-04-20 DIAGNOSIS — I743 Embolism and thrombosis of arteries of the lower extremities: Secondary | ICD-10-CM | POA: Diagnosis not present

## 2022-04-21 ENCOUNTER — Encounter: Payer: Medicare Other | Admitting: Vascular Surgery

## 2022-04-23 ENCOUNTER — Ambulatory Visit: Payer: Medicare Other | Admitting: Vascular Surgery

## 2022-04-23 ENCOUNTER — Encounter: Payer: Self-pay | Admitting: Vascular Surgery

## 2022-04-23 VITALS — BP 129/82 | HR 70 | Temp 97.8°F | Resp 20 | Ht 69.0 in | Wt 151.0 lb

## 2022-04-23 DIAGNOSIS — I739 Peripheral vascular disease, unspecified: Secondary | ICD-10-CM | POA: Diagnosis not present

## 2022-04-23 DIAGNOSIS — Z9889 Other specified postprocedural states: Secondary | ICD-10-CM

## 2022-04-23 DIAGNOSIS — Z8679 Personal history of other diseases of the circulatory system: Secondary | ICD-10-CM

## 2022-04-23 DIAGNOSIS — I724 Aneurysm of artery of lower extremity: Secondary | ICD-10-CM | POA: Diagnosis not present

## 2022-04-23 NOTE — Progress Notes (Addendum)
Office Note     CC: Reestablish care, left knee pain Requesting Provider:  [name], MD  HPI: Terry Boyd is a 82 y.o. (03/04/1940) male presenting at the request of .Terry Nation, MD for evaluation of left knee pain, and to reestablish care.  Prior vascular surgery history includes EVAR for asymptomatic aneurysm, left-sided above-knee to below-knee popliteal artery bypass in 2014 which was found to be occluded in 2016, with no further interventions as the patient was asymptomatic.  Exam, Terry Boyd was doing well, accompanied by his grandson.  A native of [place], Terry Boyd has lived there his entire life.  Terry Boyd has 2 children, 4 grandchildren.  Terry Boyd is excited for his upcoming 82th birthday.  Over the last several weeks, Terry Boyd is appreciated left knee pain.  This occurs when trying to make quick movements left and right.  This is not associated with ambulation.  No symptoms of claudication, ischemic rest pain, tissue loss  The pt is not on a statin for cholesterol management.  The pt is not on a daily aspirin.   Other AC:  - The pt is not on medication for hypertension.   The pt is not diabetic.  Tobacco hx:  1/2 ppd  Past Medical History:  Diagnosis Date   AAA (abdominal aortic aneurysm) ([place])    EVAR in 2011 by Dr. Oneida Boyd   Arthritis    Chronic back pain    COPD (chronic obstructive pulmonary disease) ([place])    Depression    Essential hypertension    GERD (gastroesophageal reflux disease)    History of hiatal hernia    Hypothyroidism    Peripheral vascular disease (HCC)    Small cell lung cancer (HCC)    Carboplatin and etoposide x 4 cycles from 01/18/2018 to 03/29/2018    Past Surgical History:  Procedure Laterality Date   ABDOMINAL AORTAGRAM N/A 03/14/2012   Procedure: ABDOMINAL [place];  Surgeon: Terry Mitchell, MD;  Location: [facility] CATH LAB;  Service: Cardiovascular;  Laterality: N/A;   ABDOMINAL AORTIC ANEURYSM REPAIR  04-22-2009   Stent graft repair of AAA    APPENDECTOMY     BRONCHIAL BRUSHINGS Right 11/30/2017   Procedure: BRONCHIAL BRUSHINGS;  Surgeon: Terry Du, MD;  Location: [facility];  Service: Cardiopulmonary;  Laterality: Right;   BRONCHIAL WASHINGS Right 11/30/2017   Procedure: BRONCHIAL WASHINGS;  Surgeon: Terry Du, MD;  Location: [facility];  Service: Cardiopulmonary;  Laterality: Right;   BYPASS GRAFT POPLITEAL TO POPLITEAL  03/20/2012   Procedure: BYPASS GRAFT POPLITEAL TO POPLITEAL;  Surgeon: Terry Dutch, MD;  Location: [place];  Service: Vascular;  Laterality: Left;  ABOVE THE KNEE POPLITEAL ARTERY TO BELOW THE KNEE POPLITEAL ARTERY BYPASS GRAFT USING REVERSE LEFT GREATER SAPHENOUS VEIN    CATARACT EXTRACTION W/PHACO Right 01/25/2022   Procedure: CATARACT EXTRACTION PHACO AND INTRAOCULAR LENS PLACEMENT ([place]);  Surgeon: Terry Goldmann, MD;  Location: [facility];  Service: Ophthalmology;  Laterality: Right;  CDE: 7.80   CATARACT EXTRACTION W/PHACO Left 02/08/2022   Procedure: CATARACT EXTRACTION PHACO AND INTRAOCULAR LENS PLACEMENT ([facility]);  Surgeon: Terry Goldmann, MD;  Location: [facility];  Service: Ophthalmology;  Laterality: Left;  CDE 9.55   ENDARTERECTOMY POPLITEAL  03/20/2012   Procedure: ENDARTERECTOMY POPLITEAL;  Surgeon: Terry Dutch, MD;  Location[facility] OR;  Service: Vascular;  Laterality: Left;   LIGATION OF LEFT LEG POPLITEAL ANEURYSM   ESOPHAGOGASTRODUODENOSCOPY  09/28/2017   Dr. Lurlean Boyd: Medium sized hiatal hernia, residual food.  Suspect gastroparesis.  Gastric atony.   ESOPHAGOGASTRODUODENOSCOPY  01/05/2018   Terry Boyd.  Large amount of solid food upon entry to the stomach.  Pylorus appeared patent.  Normal duodenal mucosa without evidence of obstruction to the second portion of duodenum.  Balloon dilation performed of the pylorus.   FLEXIBLE BRONCHOSCOPY N/A 11/30/2017   Procedure: FLEXIBLE BRONCHOSCOPY;  Surgeon: Terry Du, MD;  [facility];  Service: Cardiopulmonary;  Laterality:  N/A;   INTRAOPERATIVE ARTERIOGRAM  03/20/2012   Procedure: INTRA OPERATIVE ARTERIOGRAM;  Surgeon: Terry Dutch, MD;  Location: [place];  Service: Vascular;  Laterality: Left;  TIMES ONE   PORTACATH PLACEMENT Left 01/06/2018   Procedure: INSERTION PORT-A-CATH;  Surgeon: Terry Signs, MD;  Location: [facility];  Service: General;  Laterality: Left;   [place] N/A 12/23/2017   Procedure: VIDEO BRONCHOSCOPY WITH ENDOBRONCHIAL ULTRASOUND;  Surgeon: Terry Nakayama, MD;  Location: [place];  Service: Thoracic;  Laterality: N/A;    Social History   Socioeconomic History   Marital status: Divorced    Spouse name: Not on file   Number of children: 2   Years of education: Not on file   Highest education level: Not on file  Occupational History   Occupation: [employer]     Comment: [employer] exposure   Occupation: Sales executive    Comment: [employer] exsposure  Tobacco Use   Smoking status: Every Day    Types: Cigars   Smokeless tobacco: Never  Vaping Use   Vaping Use: Never used  Substance and Sexual Activity   Alcohol use: No   Drug use: No   Sexual activity: Not on file  Other Topics Concern   Not on file  Social History Narrative   Sedentary   Social Determinants of Health   Financial Resource Strain: Low Risk  (03/24/2020)   Overall Financial Resource Strain (CARDIA)    Difficulty of Paying Living Expenses: Not very hard  Food Insecurity: No Food Insecurity (03/24/2020)   Hunger Vital Sign    Worried About Running Out of Food in the Last Year: Never true    Ran Out of Food in the Last Year: Never true  Transportation Needs: No Transportation Needs (03/24/2020)   PRAPARE - Hydrologist (Medical): No    Lack of Transportation (Non-Medical): No  Physical Activity: Inactive (03/24/2020)   Exercise Vital Sign    Days of Exercise per Week: 0 days    Minutes of Exercise per Session: 0 min   Stress: No Stress Concern Present (03/24/2020)   [place]    Feeling of Stress : Not at all  Social Connections: Moderately Isolated (03/24/2020)   Social Connection and Isolation Panel [NHANES]    Frequency of Communication with Friends and Family: Three times a week    Frequency of Social Gatherings with Friends and Family: Twice a week    Attends Religious Services: 1 to 4 times per year    Active Member of [employer] or Organizations: No    Attends Archivist Meetings: Never    Marital Status: Divorced  Human resources officer Violence: Not At Risk (03/24/2020)   Humiliation, Afraid, Rape, and Kick questionnaire    Fear of Current or Ex-Partner: No    Emotionally Abused: No    Physically Abused: No    Sexually Abused: No   Family History  Problem Relation Age of Onset  Diabetes Mother    Stroke Mother    Heart disease Father    Diabetes Father    Heart disease Sister        CABG x 4   Cancer Brother        throat cancer   Diabetes Sister    Heart disease Sister    Cancer Brother        pancreatic   Prostate cancer Brother    Kidney disease Son    Colon cancer Neg Hx     Current Outpatient Medications  Medication Sig Dispense Refill   Ipratropium-Albuterol (COMBIVENT) 20-100 MCG/ACT AERS respimat Inhale 1 puff into the lungs every 6 (six) hours as needed for wheezing.     levothyroxine (SYNTHROID) 137 MCG tablet Take 137 mcg by mouth every other day. Alternates with 150 mcg     LORazepam (ATIVAN) 1 MG tablet Take 1 tablet (1 mg total) by mouth 2 (two) times daily. 60 tablet 3   sertraline (ZOLOFT) 50 MG tablet Take 50 mg by mouth daily.     predniSONE (DELTASONE) 10 MG tablet Take 10 mg by mouth. (Patient not taking: Reported on 04/23/2022)     No current facility-administered medications for this visit.    Allergies  Allergen Reactions   Morphine And Related Shortness Of Breath and Other (See  Comments)    Hallucinations pt has taken hydromorphone before     REVIEW OF SYSTEMS:  '[X]'$  denotes positive finding, '[ ]'$  denotes negative finding Cardiac  Comments:  Chest pain or chest pressure:    Shortness of breath upon exertion:    Short of breath when lying flat:    Irregular heart rhythm:        Vascular    Pain in calf, thigh, or hip brought on by ambulation:    Pain in feet at night that wakes you up from your sleep:     Blood clot in your veins:    Leg swelling:         Pulmonary    Oxygen at home:    Productive cough:     Wheezing:         Neurologic    Sudden weakness in arms or legs:     Sudden numbness in arms or legs:     Sudden onset of difficulty speaking or slurred speech:    Temporary loss of vision in one eye:     Problems with dizziness:         Gastrointestinal    Blood in stool:     Vomited blood:         Genitourinary    Burning when urinating:     Blood in urine:        Psychiatric    Major depression:         Hematologic    Bleeding problems:    Problems with blood clotting too easily:        Skin    Rashes or ulcers:        Constitutional    Fever or chills:      PHYSICAL EXAMINATION:  Vitals:   04/23/22 1032  BP: 129/82  Pulse: 70  Resp: 20  Temp: 97.8 F (36.6 C)  SpO2: 94%  Weight: 151 lb (68.5 kg)  Height: '5\' 9"'$  (1.753 m)    General:  WDWN in NAD; vital Boyd documented above Gait: Not observed HENT: WNL, normocephalic Pulmonary: normal non-labored breathing , without wheezing Cardiac: regular HR Abdomen:  soft, NT, no masses Skin: without rashes Vascular Exam/Pulses:  Right Left  Radial 2+ (normal) 2+ (normal)  Ulnar    Femoral 2+ (normal) 2+ (normal)  Popliteal    DP 1+ (weak) absent       Extremities: without ischemic changes, without Gangrene , without cellulitis; without open wounds;  Musculoskeletal: no muscle wasting or atrophy  Neurologic: A&O X 3;  No focal weakness or paresthesias are  detected Psychiatric:  The pt has Normal affect.   Non-Invasive Vascular Imaging:   CT angiogram completed in the [facility] was reviewed demonstrating occluded left AKA to BK popliteal artery bypass, occluded left popliteal artery aneurysm.  Distally, there is reconstitution with what appears to be three-vessel runoff.  EVAR without concern    ASSESSMENT/PLAN: ALYSTER BINGEN is a 82 y.o. male presenting with musculoskeletal knee pain most appreciated when making quick movements left and right.  Terry Boyd denies symptoms of claudication, ischemic rest pain, tissue loss.  Long conversation with Terry Boyd regarding the above.  I am happy that Terry Boyd does not have symptomatic peripheral arterial disease.  The left-sided popliteal artery occlusion has been present since 2014 at the time of surgery.  With thrombosis of his bypass, Terry Boyd remained asymptomatic in 2016, so is currently living off geniculate collaterals.  Being that Terry Boyd is asymptomatic, Terry Boyd does not require any further imaging or intervention.  Terry Boyd would benefit from a daily aspirin which Terry Boyd is happy to take.  There was some confusion about whether Terry Boyd was on a statin medication.  Terry Boyd would benefit from one with his peripheral arterial disease.  My plan is to see Terry Boyd on a yearly basis with repeat ABI duplex ultrasound of his endovascular aortic repair.  With his musculoskeletal knee pain, I plan to refer him to my colleague Dr. Sammuel Hines to discuss a possible knee brace.  I do not think Terry Boyd is a good surgical candidate from a perfusion standpoint.   Broadus John, MD [facility] [phone]

## 2022-05-03 ENCOUNTER — Ambulatory Visit (HOSPITAL_COMMUNITY)
Admission: RE | Admit: 2022-05-03 | Discharge: 2022-05-03 | Disposition: A | Discharge status: Home / Self Care | Payer: Medicare Other | Source: Ambulatory Visit | Attending: Hematology | Admitting: Hematology

## 2022-05-03 DIAGNOSIS — C3490 Malignant neoplasm of unspecified part of unspecified bronchus or lung: Secondary | ICD-10-CM | POA: Insufficient documentation

## 2022-05-03 DIAGNOSIS — N3289 Other specified disorders of bladder: Secondary | ICD-10-CM | POA: Diagnosis not present

## 2022-05-03 MED ORDER — IOHEXOL 300 MG/ML  SOLN
100.0000 mL | Freq: Once | INTRAMUSCULAR | Status: AC | PRN
  Administered 2022-05-03: 100 mL via INTRAVENOUS

## 2022-05-03 MED ORDER — GADOBUTROL 1 MMOL/ML IV SOLN
7.0000 mL | Freq: Once | INTRAVENOUS | Status: AC | PRN
  Administered 2022-05-03: 7 mL via INTRAVENOUS

## 2022-05-05 ENCOUNTER — Ambulatory Visit (HOSPITAL_BASED_OUTPATIENT_CLINIC_OR_DEPARTMENT_OTHER): Payer: Medicare Other | Admitting: Orthopaedic Surgery

## 2022-05-10 ENCOUNTER — Inpatient Hospital Stay: Payer: Medicare Other | Attending: Hematology | Admitting: Hematology

## 2022-05-10 VITALS — BP 138/78 | HR 81 | Temp 98.0°F | Resp 18 | Ht 69.0 in | Wt 150.8 lb

## 2022-05-10 DIAGNOSIS — F17210 Nicotine dependence, cigarettes, uncomplicated: Secondary | ICD-10-CM | POA: Diagnosis not present

## 2022-05-10 DIAGNOSIS — Z79899 Other long term (current) drug therapy: Secondary | ICD-10-CM | POA: Diagnosis not present

## 2022-05-10 DIAGNOSIS — C3490 Malignant neoplasm of unspecified part of unspecified bronchus or lung: Secondary | ICD-10-CM

## 2022-05-10 NOTE — Progress Notes (Signed)
Terry Boyd [address] [place] [address]    Clinic Day:  05/10/2022  Referring physician: Lansdale Nation, MD  Patient Care Team:  Nation, MD as PCP - General (Internal Medicine) Satira Sark, MD as PCP - Cardiology (Cardiology) Gala Romney Cristopher Estimable, MD as Consulting Physician (Gastroenterology)   ASSESSMENT & PLAN:   Assessment: 1.  Limited stage small cell lung cancer: -Chemoradiation therapy with carboplatin and etoposide 4 cycles from 01/18/2018 through 03/27/2018. -PCI was declined. -CT chest with contrast on 10/16/2019 did not show any residual or recurrent tumor or metastatic disease.  Stable right adrenal gland nodule. -CT head with and without contrast on 10/16/2019 was negative for brain meta stasis.  Plan: 1.  Limited stage small cell lung cancer: - At last visit he lost 15 pounds. - He is continuing to smoke 3 cigars/day. - Hence have done scans. - We reviewed CT [facility] from 05/03/2022: No evidence of metastatic disease.  Bladder wall thickening. - MRI brain (05/03/2022): No evidence of metastatic disease. - His weight has been stable since last visit.  He is drinking 1 bottle of boost per day for the last 1 month.  He eats only 1 meal per day for the last several decades. - I have recommended follow-up in 6 months with repeat CT of the chest with contrast and labs.  Orders Placed This Encounter  Procedures   CT Chest W Contrast    Standing Status:   Future    Standing Expiration Date:   05/10/2023    Order Specific Question:   If indicated for the ordered procedure, I authorize the administration of contrast media per Radiology protocol    Answer:   Yes    Order Specific Question:   Does the patient have a contrast media/X-ray dye allergy?    Answer:   No    Order Specific Question:   Preferred imaging location?    Answer:   [facility]    Order Specific Question:   Release to patient    Answer:   Immediate [1]   CBC with  Differential/Platelet    Standing Status:   Future    Standing Expiration Date:   05/10/2023    Order Specific Question:   Release to patient    Answer:   Immediate   Comprehensive metabolic panel    Standing Status:   Future    Standing Expiration Date:   05/10/2023    Order Specific Question:   Release to patient    Answer:   Immediate      I,Alexis Herring,acting as a scribe for Derek Jack, MD.,have documented all relevant documentation on the behalf of Derek Jack, MD,as directed by  Derek Jack, MD while in the presence of Derek Jack, MD.   I, Derek Jack MD, have reviewed the above documentation for accuracy and completeness, and I agree with the above.   Derek Jack, MD   3/11/20243:58 PM  CHIEF COMPLAINT:   Diagnosis: limited stage small cell lung cancer    Cancer Staging  No matching staging information was found for the patient.   Prior Therapy: Carboplatin and etoposide x 4 cycles from 01/18/2018 to 03/29/2018   Current Therapy:  surveillance    HISTORY OF PRESENT ILLNESS:   Oncology History  Small cell lung cancer ([place])  12/30/2017 Initial Diagnosis   Small cell lung cancer ([place])   01/18/2018 - 03/29/2018 Chemotherapy   The patient had palonosetron (ALOXI) injection 0.25  mg, 0.25 mg, Intravenous,  Once, 4 of 4 cycles Administration: 0.25 mg (01/18/2018), 0.25 mg (02/08/2018), 0.25 mg (03/06/2018), 0.25 mg (03/27/2018) CARBOplatin (PARAPLATIN) 300 mg in sodium chloride 0.9 % 250 mL chemo infusion, 300 mg (100 % of original dose 300 mg), Intravenous,  Once, 4 of 4 cycles Dose modification: 300 mg (original dose 300 mg, Cycle 1), 409.5 mg (original dose 409.5 mg, Cycle 2),   (original dose 409.5 mg, Cycle 3), 409.5 mg (original dose 409.5 mg, Cycle 4) Administration: 300 mg (01/18/2018), 410 mg (02/08/2018), 410 mg (03/06/2018), 410 mg (03/27/2018) etoposide (VEPESID) 160 mg in sodium chloride 0.9 % 500 mL chemo  infusion, 90 mg/m2 = 160 mg (90 % of original dose 100 mg/m2), Intravenous,  Once, 4 of 4 cycles Dose modification: 90 mg/m2 (90 % of original dose 100 mg/m2, Cycle 1, Reason: Patient Age) Administration: 160 mg (01/18/2018), 170 mg (01/19/2018), 170 mg (02/10/2018), 170 mg (02/08/2018), 170 mg (02/09/2018), 170 mg (01/20/2018), 170 mg (03/07/2018), 170 mg (03/06/2018), 170 mg (03/08/2018), 170 mg (03/27/2018), 170 mg (03/28/2018), 170 mg (03/29/2018) fosaprepitant (EMEND) 150 mg, dexamethasone (DECADRON) 12 mg in sodium chloride 0.9 % 145 mL IVPB, , Intravenous,  Once, 4 of 4 cycles Administration:  (01/18/2018),  (02/08/2018),  (03/06/2018),  (03/27/2018)  for chemotherapy treatment.       INTERVAL HISTORY:   Terry Boyd is a 82 y.o. male presenting to clinic today for follow up of limited stage small cell lung cancer. He was last seen by me on 04/08/22.  Today, he states that he is doing well overall. His appetite level is at 75%. His energy level is at 40%. He denies any new onset pains.  He is drinking 1 can of boost per day which he started about a month ago.  He is eating 1 meal per day.   PAST MEDICAL HISTORY:   Past Medical History: Past Medical History:  Diagnosis Date   AAA (abdominal aortic aneurysm) ([place])    EVAR in 2011 by Dr. Oneida Alar   Arthritis    Chronic back pain    COPD (chronic obstructive pulmonary disease) ([place])    Depression    Essential hypertension    GERD (gastroesophageal reflux disease)    History of hiatal hernia    Hypothyroidism    Peripheral vascular disease (HCC)    Small cell lung cancer (HCC)    Carboplatin and etoposide x 4 cycles from 01/18/2018 to 03/29/2018    Surgical History: Past Surgical History:  Procedure Laterality Date   ABDOMINAL AORTAGRAM N/A 03/14/2012   Procedure: ABDOMINAL Maxcine Ham;  Surgeon: Serafina Mitchell, MD;  Location: [facility] CATH LAB;  Service: Cardiovascular;  Laterality: N/A;   ABDOMINAL AORTIC ANEURYSM REPAIR  04-22-2009   Stent graft  repair of AAA   APPENDECTOMY     BRONCHIAL BRUSHINGS Right 11/30/2017   Procedure: BRONCHIAL BRUSHINGS;  Surgeon: Sinda Du, MD;  Location[facility] ENDO [facility];  Service: Cardiopulmonary;  Laterality: Right;   BRONCHIAL WASHINGS Right 11/30/2017   Procedure: BRONCHIAL WASHINGS;  Surgeon: Sinda Du, MD;  [facility];  Service: Cardiopulmonary;  Laterality: Right;   BYPASS GRAFT POPLITEAL TO POPLITEAL  03/20/2012   Procedure: BYPASS GRAFT POPLITEAL TO POPLITEAL;  Surgeon: Elam Dutch, MD;  Location: [place];  Service: Vascular;  Laterality: Left;  ABOVE THE KNEE POPLITEAL ARTERY TO BELOW THE KNEE POPLITEAL ARTERY BYPASS GRAFT USING REVERSE LEFT GREATER SAPHENOUS VEIN    CATARACT EXTRACTION W/PHACO Right 01/25/2022   Procedure: CATARACT EXTRACTION PHACO  AND INTRAOCULAR LENS PLACEMENT (IOC);  Surgeon: Baruch Goldmann, MD;  Location: [facility] ORS;  Service: Ophthalmology;  Laterality: Right;  CDE: 7.80   CATARACT EXTRACTION W/PHACO Left 02/08/2022   Procedure: CATARACT EXTRACTION PHACO AND INTRAOCULAR LENS PLACEMENT (IOC);  Surgeon: Baruch Goldmann, MD;  Location: [facility] OR[facility];  Service: Ophthalmology;  Laterality: Left;  CDE 9.55   ENDARTERECTOMY POPLITEAL  03/20/2012   Procedure: ENDARTERECTOMY POPLITEAL;  Surgeon: Elam Dutch, MD;  Location[facility] OR;  Service: Vascular;  Laterality: Left;   LIGATION OF LEFT LEG POPLITEAL ANEURYSM   ESOPHAGOGASTRODUODENOSCOPY  09/28/2017   Dr. Lurlean Nanny: Medium sized hiatal hernia, residual food.  Suspect gastroparesis.  Gastric atony.   ESOPHAGOGASTRODUODENOSCOPY  01/05/2018   [place].  Large amount of solid food upon entry to the stomach.  Pylorus appeared patent.  Normal duodenal mucosa without evidence of obstruction to the second portion of duodenum.  Balloon dilation performed of the pylorus.   FLEXIBLE BRONCHOSCOPY N/A 11/30/2017   Procedure: FLEXIBLE BRONCHOSCOPY;  Surgeon: Sinda Du, MD;  [facility];  Service:  Cardiopulmonary;  Laterality: N/A;   INTRAOPERATIVE ARTERIOGRAM  03/20/2012   Procedure: INTRA OPERATIVE ARTERIOGRAM;  Surgeon: Elam Dutch, MD;  Location: [place];  Service: Vascular;  Laterality: Left;  TIMES ONE   PORTACATH PLACEMENT Left 01/06/2018   Procedure: INSERTION PORT-A-CATH;  Surgeon: Aviva Signs, MD;  Location: [facility];  Service: General;  Laterality: Left;   [place] N/A 12/23/2017   Procedure: VIDEO BRONCHOSCOPY WITH ENDOBRONCHIAL ULTRASOUND;  Surgeon: Melrose Nakayama, MD;  Location: [place];  Service: Thoracic;  Laterality: N/A;    Social History: Social History   Socioeconomic History   Marital status: Divorced    Spouse name: Not on file   Number of children: 2   Years of education: Not on file   Highest education level: Not on file  Occupational History   Occupation: [place]     Comment: [employer] exposure   Occupation: Sales executive    Comment: [employer] exsposure  Tobacco Use   Smoking status: Every Day    Types: Cigars   Smokeless tobacco: Never  Vaping Use   Vaping Use: Never used  Substance and Sexual Activity   Alcohol use: No   Drug use: No   Sexual activity: Not on file  Other Topics Concern   Not on file  Social History Narrative   Sedentary   Social Determinants of Health   Financial Resource Strain: Low Risk  (03/24/2020)   Overall Financial Resource Strain (CARDIA)    Difficulty of Paying Living Expenses: Not very hard  Food Insecurity: No Food Insecurity (03/24/2020)   Hunger Vital Sign    Worried About Running Out of Food in the Last Year: Never true    Ran Out of Food in the Last Year: Never true  Transportation Needs: No Transportation Needs (03/24/2020)   PRAPARE - Hydrologist (Medical): No    Lack of Transportation (Non-Medical): No  Physical Activity: Inactive (03/24/2020)   Exercise Vital Sign    Days of Exercise per Week: 0 days     Minutes of Exercise per Session: 0 min  Stress: No Stress Concern Present (03/24/2020)   [place]    Feeling of Stress : Not at all  Social Connections: Moderately Isolated (03/24/2020)   Social Connection and Isolation Panel [NHANES]    Frequency  of Communication with Friends and Family: Three times a week    Frequency of Social Gatherings with Friends and Family: Twice a week    Attends Religious Services: 1 to 4 times per year    Active Member of [employer] or Organizations: No    Attends Archivist Meetings: Never    Marital Status: Divorced  Human resources officer Violence: Not At Risk (03/24/2020)   Humiliation, Afraid, Rape, and Kick questionnaire    Fear of Current or Ex-Partner: No    Emotionally Abused: No    Physically Abused: No    Sexually Abused: No    Family History: Family History  Problem Relation Age of Onset   Diabetes Mother    Stroke Mother    Heart disease Father    Diabetes Father    Heart disease Sister        CABG x 4   Cancer Brother        throat cancer   Diabetes Sister    Heart disease Sister    Cancer Brother        pancreatic   Prostate cancer Brother    Kidney disease Son    Colon cancer Neg Hx     Current Medications:  Current Outpatient Medications:    Ipratropium-Albuterol (COMBIVENT) 20-100 MCG/ACT AERS respimat, Inhale 1 puff into the lungs every 6 (six) hours as needed for wheezing., Disp: , Rfl:    levothyroxine (SYNTHROID) 137 MCG tablet, Take 137 mcg by mouth every other day. Alternates with 150 mcg, Disp: , Rfl:    LORazepam (ATIVAN) 1 MG tablet, Take 1 tablet (1 mg total) by mouth 2 (two) times daily., Disp: 60 tablet, Rfl: 3   predniSONE (DELTASONE) 10 MG tablet, Take 10 mg by mouth., Disp: , Rfl:    sertraline (ZOLOFT) 50 MG tablet, Take 50 mg by mouth daily., Disp: , Rfl:    Allergies: Allergies  Allergen Reactions   Morphine And Related Shortness Of  Breath and Other (See Comments)    Hallucinations pt has taken hydromorphone before    REVIEW OF SYSTEMS:   Review of Systems  Constitutional:  Negative for chills, fatigue and fever.  HENT:   Negative for lump/mass, mouth sores, nosebleeds, sore throat and trouble swallowing.   Eyes:  Negative for eye problems.  Respiratory:  Negative for cough and shortness of breath.   Cardiovascular:  Negative for chest pain, leg swelling and palpitations.  Gastrointestinal:  Negative for abdominal pain, constipation, diarrhea, nausea and vomiting.  Genitourinary:  Positive for frequency. Negative for bladder incontinence, difficulty urinating, dysuria, hematuria and nocturia.   Musculoskeletal:  Negative for arthralgias, back pain, flank pain, myalgias and neck pain.  Skin:  Negative for itching and rash.  Neurological:  Negative for dizziness, headaches and numbness.  Hematological:  Does not bruise/bleed easily.  Psychiatric/Behavioral:  Negative for depression, sleep disturbance and suicidal ideas. The patient is not nervous/anxious.   All other systems reviewed and are negative.    VITALS:   Blood pressure 138/78, pulse 81, temperature 98 F (36.7 C), temperature source Oral, resp. rate 18, height '5\' 9"'$  (1.753 m), weight 150 lb 12.8 oz (68.4 kg), SpO2 100 %.  Wt Readings from Last 3 Encounters:  05/10/22 150 lb 12.8 oz (68.4 kg)  04/23/22 151 lb (68.5 kg)  04/08/22 150 lb 14.4 oz (68.4 kg)    Body mass index is 22.27 kg/m.  Performance status (ECOG): 1 - Symptomatic but completely ambulatory  PHYSICAL EXAM:  Physical Exam Vitals and nursing note reviewed. Exam conducted with a chaperone present.  Constitutional:      Appearance: Normal appearance.  Cardiovascular:     Rate and Rhythm: Normal rate and regular rhythm.     Pulses: Normal pulses.     Heart sounds: Normal heart sounds.  Pulmonary:     Effort: Pulmonary effort is normal.     Breath sounds: Normal breath sounds.   Abdominal:     Palpations: Abdomen is soft. There is no hepatomegaly, splenomegaly or mass.     Tenderness: There is no abdominal tenderness.  Musculoskeletal:     Right lower leg: No edema.     Left lower leg: No edema.  Lymphadenopathy:     Cervical: No cervical adenopathy.     Right cervical: No superficial, deep or posterior cervical adenopathy.    Left cervical: No superficial, deep or posterior cervical adenopathy.     Upper Body:     Right upper body: No supraclavicular or axillary adenopathy.     Left upper body: No supraclavicular or axillary adenopathy.  Neurological:     General: No focal deficit present.     Mental Status: He is alert and oriented to person, place, and time.  Psychiatric:        Mood and Affect: Mood normal.        Behavior: Behavior normal.     LABS:      Latest Ref Rng & Units 04/01/2022   10:35 AM 09/23/2021    9:14 AM 03/23/2021    9:04 AM  CBC  WBC 4.0 - 10.5 K/uL 9.8  7.7  7.8   Hemoglobin 13.0 - 17.0 g/dL 13.6  14.6  14.9   Hematocrit 39.0 - 52.0 % 43.8  46.2  46.1   Platelets 150 - 400 K/uL 222  149  143       Latest Ref Rng & Units 04/01/2022   10:35 AM 09/23/2021    9:14 AM 03/23/2021    9:04 AM  CMP  Glucose 70 - 99 mg/dL 139  141  151   BUN 8 - 23 mg/dL '15  12  16   '$ Creatinine 0.61 - 1.24 mg/dL 0.97  1.13  1.05   Sodium 135 - 145 mmol/L 138  138  135   Potassium 3.5 - 5.1 mmol/L 4.0  4.7  4.0   Chloride 98 - 111 mmol/L 104  100  99   CO2 22 - 32 mmol/L '24  29  28   '$ Calcium 8.9 - 10.3 mg/dL 8.2  8.9  8.5   Total Protein 6.5 - 8.1 g/dL 7.5  7.7  7.4   Total Bilirubin 0.3 - 1.2 mg/dL 0.5  0.8  0.7   Alkaline Phos 38 - 126 U/L 72  66  76   AST 15 - 41 U/L '18  17  14   '$ ALT 0 - 44 U/L '19  13  11      '$ No results found for: "CEA1", "CEA" / No results found for: "CEA1", "CEA" No results found for: "PSA1" No results found for: "[id number]" No results found for: "CAN125"  No results found for: "TOTALPROTELP", "ALBUMINELP", "A1GS",  "A2GS", "BETS", "BETA2SER", "GAMS", "MSPIKE", "SPEI" No results found for: "TIBC", "FERRITIN", "IRONPCTSAT" Lab Results  Component Value Date   LDH 123 04/06/2019     STUDIES:   CT Abdomen Pelvis W Contrast  Result Date: 05/04/2022 CLINICAL DATA:  Small cell lung cancer; * Tracking Code: BO *  EXAM: CT ABDOMEN AND PELVIS WITH CONTRAST TECHNIQUE: Multidetector CT imaging of the abdomen and pelvis was performed using the standard protocol following bolus administration of intravenous contrast. RADIATION DOSE REDUCTION: This exam was performed according to the departmental dose-optimization program which includes automated exposure control, adjustment of the mA and/or kV according to patient size and/or use of iterative reconstruction technique. CONTRAST:  115m OMNIPAQUE IOHEXOL 300 MG/ML  SOLN COMPARISON:  PET-CT dated April 13, 2018 FINDINGS: Lower chest: No acute abnormality. Hepatobiliary: No focal liver abnormality is seen. No gallstones, gallbladder wall thickening, or biliary dilatation. Pancreas: Unremarkable. No pancreatic ductal dilatation or surrounding inflammatory changes. Spleen: Normal in size without focal abnormality. Adrenals/Urinary Tract: Right adrenal gland nodule measuring 11 mm on series 2, image 18, unchanged when compared with the prior exam and consistent with benign adenoma. No hydronephrosis or nephrolithiasis. Bladder wall thickening Stomach/Bowel: Stomach is within normal limits. Diverticulosis of the sigmoid colon. No evidence of bowel wall thickening, distention, or inflammatory changes. Vascular/Lymphatic: Prior aorto bi-iliac endovascular repair. Aortic atherosclerosis. No enlarged abdominal or pelvic lymph nodes. Reproductive: Prostate is unremarkable. Other: Small right-greater-than-left fat containing inguinal hernias. No abdominopelvic ascites. Musculoskeletal: No acute or significant osseous findings. IMPRESSION: 1. No evidence of metastatic disease in the abdomen or  pelvis. 2. Bladder wall thickening, findings can be seen in the setting of cystitis. Correlate with urinalysis. 3. Aortic Atherosclerosis (ICD10-I70.0). Electronically Signed   By: LYetta GlassmanM.D.   On: 05/04/2022 10:17   MR Brain W Wo Contrast  Result Date: 05/04/2022 CLINICAL DATA:  Small cell lung cancer (SCLC), monitor weight loss unintentional EXAM: MRI HEAD WITHOUT AND WITH CONTRAST TECHNIQUE: Multiplanar, multiecho pulse sequences of the brain and surrounding structures were obtained without and with intravenous contrast. CONTRAST:  710mGADAVIST GADOBUTROL 1 MMOL/ML IV SOLN COMPARISON:  None Available. FINDINGS: Brain: No acute infarction, hemorrhage, hydrocephalus, extra-axial collection or mass lesion. Similar small remote infarct in the left corona radiata and scattered chronic microvascular ischemic change. No pathologic enhancement. Vascular: Major arterial flow voids are maintained at the skull base. Skull and upper cervical spine: Normal marrow signal. Sinuses/Orbits: Clear sinuses.  No acute findings. Other: No mastoid effusions. IMPRESSION: No evidence of acute abnormality or metastatic disease. Electronically Signed   By: FrMargaretha Sheffield.D.   On: 05/04/2022 07:35

## 2022-05-10 NOTE — Patient Instructions (Addendum)
[facility]  Discharge Instructions  You were seen and examined today by Dr. Delton Coombes.  Dr. Delton Coombes discussed your most recent lab work and CT scan which revealed everything looks good.  Dr. Delton Coombes will repeat CT scan of your chest and labs.  Follow-up as scheduled in 6 months.    Thank you for choosing [facility] to provide your oncology and hematology care.   To afford each patient quality time with our provider, please arrive at least 15 minutes before your scheduled appointment time. You may need to reschedule your appointment if you arrive late (10 or more minutes). Arriving late affects you and other patients whose appointments are after yours.  Also, if you miss three or more appointments without notifying the office, you may be dismissed from the clinic at the provider's discretion.    Again, thank you for choosing [facility].  Our hope is that these requests will decrease the amount of time that you wait before being seen by our physicians.   If you have a lab appointment with the [facility] please come in thru the Main Entrance and check in at the main information desk.           _____________________________________________________________  Should you have questions after your visit to [facility], please contact our office at [phone] and follow the prompts.  Our office hours are 8:00 a.m. to 4:30 p.m. Monday - Thursday and 8:00 a.m. to 2:30 p.m. Friday.  Please note that voicemails left after 4:00 p.m. may not be returned until the following business day.  We are closed weekends and all major holidays.  You do have access to a nurse 24-7, just call the main number to the clinic [phone] and do not press any options, hold on the line and a nurse will answer the phone.    For prescription refill requests, have your pharmacy contact our office and allow 72 hours.    Masks are  optional in the cancer centers. If you would like for your care team to wear a mask while they are taking care of you, please let them know. You may have one support person who is at least 80 years old accompany you for your appointments.

## 2022-07-05 DIAGNOSIS — E559 Vitamin D deficiency, unspecified: Secondary | ICD-10-CM | POA: Diagnosis not present

## 2022-07-05 DIAGNOSIS — Z1329 Encounter for screening for other suspected endocrine disorder: Secondary | ICD-10-CM | POA: Diagnosis not present

## 2022-07-05 DIAGNOSIS — E7849 Other hyperlipidemia: Secondary | ICD-10-CM | POA: Diagnosis not present

## 2022-07-05 DIAGNOSIS — Z0001 Encounter for general adult medical examination with abnormal findings: Secondary | ICD-10-CM | POA: Diagnosis not present

## 2022-07-08 DIAGNOSIS — R030 Elevated blood-pressure reading, without diagnosis of hypertension: Secondary | ICD-10-CM | POA: Diagnosis not present

## 2022-07-08 DIAGNOSIS — I700 Atherosclerosis of aorta: Secondary | ICD-10-CM | POA: Diagnosis not present

## 2022-07-08 DIAGNOSIS — Z85118 Personal history of other malignant neoplasm of bronchus and lung: Secondary | ICD-10-CM | POA: Diagnosis not present

## 2022-07-08 DIAGNOSIS — Z0001 Encounter for general adult medical examination with abnormal findings: Secondary | ICD-10-CM | POA: Diagnosis not present

## 2022-07-08 DIAGNOSIS — Z23 Encounter for immunization: Secondary | ICD-10-CM | POA: Diagnosis not present

## 2022-07-08 DIAGNOSIS — E039 Hypothyroidism, unspecified: Secondary | ICD-10-CM | POA: Diagnosis not present

## 2022-07-08 DIAGNOSIS — I7090 Unspecified atherosclerosis: Secondary | ICD-10-CM | POA: Diagnosis not present

## 2022-07-08 DIAGNOSIS — Z6821 Body mass index (BMI) 21.0-21.9, adult: Secondary | ICD-10-CM | POA: Diagnosis not present

## 2022-07-08 DIAGNOSIS — I724 Aneurysm of artery of lower extremity: Secondary | ICD-10-CM | POA: Diagnosis not present

## 2022-07-08 DIAGNOSIS — F17200 Nicotine dependence, unspecified, uncomplicated: Secondary | ICD-10-CM | POA: Diagnosis not present

## 2022-07-27 ENCOUNTER — Other Ambulatory Visit: Payer: Self-pay | Admitting: Hematology

## 2022-07-27 ENCOUNTER — Other Ambulatory Visit: Payer: Self-pay

## 2022-07-27 DIAGNOSIS — C3490 Malignant neoplasm of unspecified part of unspecified bronchus or lung: Secondary | ICD-10-CM

## 2022-08-04 DIAGNOSIS — E1165 Type 2 diabetes mellitus with hyperglycemia: Secondary | ICD-10-CM | POA: Diagnosis not present

## 2022-08-17 DIAGNOSIS — Z79899 Other long term (current) drug therapy: Secondary | ICD-10-CM | POA: Diagnosis not present

## 2022-08-17 DIAGNOSIS — X58XXXA Exposure to other specified factors, initial encounter: Secondary | ICD-10-CM | POA: Diagnosis not present

## 2022-08-17 DIAGNOSIS — Z885 Allergy status to narcotic agent status: Secondary | ICD-10-CM | POA: Diagnosis not present

## 2022-08-17 DIAGNOSIS — L089 Local infection of the skin and subcutaneous tissue, unspecified: Secondary | ICD-10-CM | POA: Diagnosis not present

## 2022-08-17 DIAGNOSIS — S41111A Laceration without foreign body of right upper arm, initial encounter: Secondary | ICD-10-CM | POA: Diagnosis not present

## 2022-08-17 DIAGNOSIS — Z23 Encounter for immunization: Secondary | ICD-10-CM | POA: Diagnosis not present

## 2022-08-25 ENCOUNTER — Other Ambulatory Visit: Payer: Self-pay

## 2022-08-25 DIAGNOSIS — C3490 Malignant neoplasm of unspecified part of unspecified bronchus or lung: Secondary | ICD-10-CM

## 2022-08-26 MED ORDER — LORAZEPAM 1 MG PO TABS: 1.0000 mg | ORAL_TABLET | Freq: Two times a day (BID) | ORAL | 3 refills | Status: DC

## 2022-09-29 DIAGNOSIS — D696 Thrombocytopenia, unspecified: Secondary | ICD-10-CM | POA: Diagnosis not present

## 2022-10-06 DIAGNOSIS — F17200 Nicotine dependence, unspecified, uncomplicated: Secondary | ICD-10-CM | POA: Diagnosis not present

## 2022-10-06 DIAGNOSIS — I700 Atherosclerosis of aorta: Secondary | ICD-10-CM | POA: Diagnosis not present

## 2022-10-06 DIAGNOSIS — E039 Hypothyroidism, unspecified: Secondary | ICD-10-CM | POA: Diagnosis not present

## 2022-10-06 DIAGNOSIS — Z6821 Body mass index (BMI) 21.0-21.9, adult: Secondary | ICD-10-CM | POA: Diagnosis not present

## 2022-10-06 DIAGNOSIS — I7090 Unspecified atherosclerosis: Secondary | ICD-10-CM | POA: Diagnosis not present

## 2022-10-06 DIAGNOSIS — I724 Aneurysm of artery of lower extremity: Secondary | ICD-10-CM | POA: Diagnosis not present

## 2022-10-06 DIAGNOSIS — R030 Elevated blood-pressure reading, without diagnosis of hypertension: Secondary | ICD-10-CM | POA: Diagnosis not present

## 2022-10-06 DIAGNOSIS — Z85118 Personal history of other malignant neoplasm of bronchus and lung: Secondary | ICD-10-CM | POA: Diagnosis not present

## 2022-10-27 DIAGNOSIS — Z1212 Encounter for screening for malignant neoplasm of rectum: Secondary | ICD-10-CM | POA: Diagnosis not present

## 2022-10-27 DIAGNOSIS — Z1211 Encounter for screening for malignant neoplasm of colon: Secondary | ICD-10-CM | POA: Diagnosis not present

## 2022-11-03 ENCOUNTER — Inpatient Hospital Stay: Payer: No Typology Code available for payment source | Attending: Internal Medicine

## 2022-11-03 ENCOUNTER — Ambulatory Visit (HOSPITAL_COMMUNITY): Payer: Medicare Other

## 2022-11-04 DIAGNOSIS — M25522 Pain in left elbow: Secondary | ICD-10-CM | POA: Diagnosis not present

## 2022-11-04 DIAGNOSIS — M25422 Effusion, left elbow: Secondary | ICD-10-CM | POA: Diagnosis not present

## 2022-11-04 DIAGNOSIS — M71122 Other infective bursitis, left elbow: Secondary | ICD-10-CM | POA: Diagnosis not present

## 2022-11-10 ENCOUNTER — Inpatient Hospital Stay: Payer: No Typology Code available for payment source | Admitting: Hematology

## 2022-11-11 DIAGNOSIS — Z681 Body mass index (BMI) 19 or less, adult: Secondary | ICD-10-CM | POA: Diagnosis not present

## 2022-11-11 DIAGNOSIS — L0390 Cellulitis, unspecified: Secondary | ICD-10-CM | POA: Diagnosis not present

## 2022-11-15 DIAGNOSIS — Z681 Body mass index (BMI) 19 or less, adult: Secondary | ICD-10-CM | POA: Diagnosis not present

## 2022-11-15 DIAGNOSIS — L0390 Cellulitis, unspecified: Secondary | ICD-10-CM | POA: Diagnosis not present

## 2022-11-23 DIAGNOSIS — M71122 Other infective bursitis, left elbow: Secondary | ICD-10-CM | POA: Diagnosis not present

## 2022-11-23 DIAGNOSIS — M25522 Pain in left elbow: Secondary | ICD-10-CM | POA: Diagnosis not present

## 2022-11-30 DIAGNOSIS — Z23 Encounter for immunization: Secondary | ICD-10-CM | POA: Diagnosis not present

## 2022-11-30 DIAGNOSIS — L0390 Cellulitis, unspecified: Secondary | ICD-10-CM | POA: Diagnosis not present

## 2022-11-30 DIAGNOSIS — Z681 Body mass index (BMI) 19 or less, adult: Secondary | ICD-10-CM | POA: Diagnosis not present

## 2022-12-01 DIAGNOSIS — M71122 Other infective bursitis, left elbow: Secondary | ICD-10-CM | POA: Diagnosis not present

## 2022-12-07 ENCOUNTER — Encounter (HOSPITAL_COMMUNITY): Payer: Self-pay | Admitting: Radiology

## 2022-12-07 ENCOUNTER — Ambulatory Visit (HOSPITAL_COMMUNITY)
Admission: RE | Admit: 2022-12-07 | Discharge: 2022-12-07 | Disposition: A | Discharge status: Home / Self Care | Payer: Medicare Other | Source: Ambulatory Visit | Attending: Hematology | Admitting: Hematology

## 2022-12-07 ENCOUNTER — Inpatient Hospital Stay: Payer: Medicare Other | Attending: Hematology

## 2022-12-07 DIAGNOSIS — Z9221 Personal history of antineoplastic chemotherapy: Secondary | ICD-10-CM | POA: Insufficient documentation

## 2022-12-07 DIAGNOSIS — C3490 Malignant neoplasm of unspecified part of unspecified bronchus or lung: Secondary | ICD-10-CM | POA: Insufficient documentation

## 2022-12-07 DIAGNOSIS — Z79899 Other long term (current) drug therapy: Secondary | ICD-10-CM | POA: Diagnosis not present

## 2022-12-07 DIAGNOSIS — F17290 Nicotine dependence, other tobacco product, uncomplicated: Secondary | ICD-10-CM | POA: Insufficient documentation

## 2022-12-07 DIAGNOSIS — I700 Atherosclerosis of aorta: Secondary | ICD-10-CM | POA: Diagnosis not present

## 2022-12-07 DIAGNOSIS — J439 Emphysema, unspecified: Secondary | ICD-10-CM | POA: Diagnosis not present

## 2022-12-07 LAB — CBC WITH DIFFERENTIAL/PLATELET
Abs Immature Granulocytes: 0.02 10*3/uL (ref 0.00–0.07)
Basophils Absolute: 0.1 10*3/uL (ref 0.0–0.1)
Basophils Relative: 1 %
Eosinophils Absolute: 0.3 10*3/uL (ref 0.0–0.5)
Eosinophils Relative: 5 %
HCT: 46.4 % (ref 39.0–52.0)
Hemoglobin: 14.1 g/dL (ref 13.0–17.0)
Immature Granulocytes: 0 %
Lymphocytes Relative: 27 %
Lymphs Abs: 1.8 10*3/uL (ref 0.7–4.0)
MCH: 29.2 pg (ref 26.0–34.0)
MCHC: 30.4 g/dL (ref 30.0–36.0)
MCV: 96.1 fL (ref 80.0–100.0)
Monocytes Absolute: 0.5 10*3/uL (ref 0.1–1.0)
Monocytes Relative: 8 %
Neutro Abs: 4 10*3/uL (ref 1.7–7.7)
Neutrophils Relative %: 59 %
Platelets: 144 10*3/uL — ABNORMAL LOW (ref 150–400)
RBC: 4.83 MIL/uL (ref 4.22–5.81)
RDW: 13.8 % (ref 11.5–15.5)
WBC: 6.7 10*3/uL (ref 4.0–10.5)
nRBC: 0 % (ref 0.0–0.2)

## 2022-12-07 LAB — COMPREHENSIVE METABOLIC PANEL
ALT: 11 U/L (ref 0–44)
AST: 14 U/L — ABNORMAL LOW (ref 15–41)
Albumin: 3.7 g/dL (ref 3.5–5.0)
Alkaline Phosphatase: 61 U/L (ref 38–126)
Anion gap: 9 (ref 5–15)
BUN: 20 mg/dL (ref 8–23)
CO2: 26 mmol/L (ref 22–32)
Calcium: 8.4 mg/dL — ABNORMAL LOW (ref 8.9–10.3)
Chloride: 105 mmol/L (ref 98–111)
Creatinine, Ser: 0.99 mg/dL (ref 0.61–1.24)
GFR, Estimated: >60 mL/min (ref >60)
Glucose, Bld: 143 mg/dL — ABNORMAL HIGH (ref 70–99)
Potassium: 4.2 mmol/L (ref 3.5–5.1)
Sodium: 140 mmol/L (ref 135–145)
Total Bilirubin: 0.5 mg/dL (ref 0.3–1.2)
Total Protein: 7.4 g/dL (ref 6.5–8.1)

## 2022-12-07 MED ORDER — IOHEXOL 300 MG/ML  SOLN
75.0000 mL | Freq: Once | INTRAMUSCULAR | Status: AC | PRN
  Administered 2022-12-07: 75 mL via INTRAVENOUS

## 2022-12-08 ENCOUNTER — Other Ambulatory Visit: Payer: Self-pay

## 2022-12-08 DIAGNOSIS — C3490 Malignant neoplasm of unspecified part of unspecified bronchus or lung: Secondary | ICD-10-CM

## 2022-12-08 MED ORDER — LORAZEPAM 1 MG PO TABS: 1.0000 mg | ORAL_TABLET | Freq: Two times a day (BID) | ORAL | 3 refills | Status: DC

## 2022-12-15 ENCOUNTER — Inpatient Hospital Stay (HOSPITAL_BASED_OUTPATIENT_CLINIC_OR_DEPARTMENT_OTHER): Payer: Medicare Other | Admitting: Hematology

## 2022-12-15 ENCOUNTER — Inpatient Hospital Stay: Payer: Medicare Other | Admitting: Hematology

## 2022-12-15 VITALS — BP 141/85 | HR 73 | Temp 97.6°F | Resp 19 | Ht 69.0 in | Wt 144.7 lb

## 2022-12-15 DIAGNOSIS — C3490 Malignant neoplasm of unspecified part of unspecified bronchus or lung: Secondary | ICD-10-CM | POA: Diagnosis not present

## 2022-12-15 DIAGNOSIS — Z9221 Personal history of antineoplastic chemotherapy: Secondary | ICD-10-CM | POA: Diagnosis not present

## 2022-12-15 DIAGNOSIS — Z79899 Other long term (current) drug therapy: Secondary | ICD-10-CM | POA: Diagnosis not present

## 2022-12-15 DIAGNOSIS — F17290 Nicotine dependence, other tobacco product, uncomplicated: Secondary | ICD-10-CM | POA: Diagnosis not present

## 2022-12-15 NOTE — Patient Instructions (Signed)
[facility]  Discharge Instructions  You were seen and examined today by Dr. Ellin Saba.  Dr. Ellin Saba discussed your most recent lab work and CT scan which revealed that everything look good and stable.  Follow-up as scheduled in 6 months.    Thank you for choosing [facility] to provide your oncology and hematology care.   To afford each patient quality time with our provider, please arrive at least 15 minutes before your scheduled appointment time. You may need to reschedule your appointment if you arrive late (10 or more minutes). Arriving late affects you and other patients whose appointments are after yours.  Also, if you miss three or more appointments without notifying the office, you may be dismissed from the clinic at the provider's discretion.    Again, thank you for choosing [facility].  Our hope is that these requests will decrease the amount of time that you wait before being seen by our physicians.   If you have a lab appointment with the Cancer Center - please note that after April 8th, all labs will be drawn in the cancer center.  You do not have to check in or register with the main entrance as you have in the past but will complete your check-in at the cancer center.            _____________________________________________________________  Should you have questions after your visit to [facility], please contact our office at [phone] and follow the prompts.  Our office hours are 8:00 a.m. to 4:30 p.m. Monday - Thursday and 8:00 a.m. to 2:30 p.m. Friday.  Please note that voicemails left after 4:00 p.m. may not be returned until the following business day.  We are closed weekends and all major holidays.  You do have access to a nurse 24-7, just call the main number to the clinic [phone] and do not press any options, hold on the line and a nurse will answer the phone.    For  prescription refill requests, have your pharmacy contact our office and allow 72 hours.    Masks are no longer required in the cancer centers. If you would like for your care team to wear a mask while they are taking care of you, please let them know. You may have one support person who is at least 80 years old accompany you for your appointments.

## 2022-12-15 NOTE — Progress Notes (Signed)
[facility] [address] [place] [address]    Clinic Day:  12/15/2022  Referring physician: Donetta Potts, MD  Patient Care Team: Donetta Potts, MD as PCP - General (Internal Medicine) Jonelle Sidle, MD as PCP - Cardiology (Cardiology) Jena Gauss Gerrit Friends, MD as Consulting Physician (Gastroenterology)   ASSESSMENT & PLAN:   Assessment: 1.  Limited stage small cell lung cancer: -Chemoradiation therapy with carboplatin and etoposide 4 cycles from 01/18/2018 through 03/27/2018. -PCI was declined. -CT chest with contrast on 10/16/2019 did not show any residual or recurrent tumor or metastatic disease.  Stable right adrenal gland nodule. -CT head with and without contrast on 10/16/2019 was negative for brain metastasis.    Plan: 1.  Limited stage small cell lung cancer: - He is continuing to smoke about 10 cigars/day.  Denies any change in cough or hemoptysis. - Reviewed labs from 12/07/2022: Normal LFTs and creatinine.  CBC grossly normal. - CT chest on 12/07/2022: No evidence of recurrence. - Recommend follow-up in 6 months with repeat CT of the chest and labs.    Orders Placed This Encounter  Procedures   CT CHEST W CONTRAST    Standing Status:   Future    Standing Expiration Date:   12/15/2023    Order Specific Question:   If indicated for the ordered procedure, I authorize the administration of contrast media per Radiology protocol    Answer:   Yes    Order Specific Question:   Does the patient have a contrast media/X-ray dye allergy?    Answer:   No    Order Specific Question:   Preferred imaging location?    Answer:   [facility]    Order Specific Question:   Release to patient    Answer:   Immediate [1]   CBC with Differential/Platelet    Standing Status:   Future    Standing Expiration Date:   12/15/2023    Order Specific Question:   Release to patient    Answer:   Immediate   Comprehensive metabolic panel    Standing  Status:   Future    Standing Expiration Date:   12/15/2023    Order Specific Question:   Release to patient    Answer:   Immediate   Magnesium    Standing Status:   Future    Standing Expiration Date:   12/15/2023    Order Specific Question:   Release to patient    Answer:   Immediate      I,Katie Daubenspeck,acting as a scribe for Doreatha Massed, MD.,have documented all relevant documentation on the behalf of Doreatha Massed, MD,as directed by  Doreatha Massed, MD while in the presence of Doreatha Massed, MD.   I, Doreatha Massed MD, have reviewed the above documentation for accuracy and completeness, and I agree with the above.   Doreatha Massed, MD   10/16/20244:05 PM  CHIEF COMPLAINT:   Diagnosis: limited stage small cell lung cancer    Cancer Staging  No matching staging information was found for the patient.    Prior Therapy: Carboplatin and etoposide x 4 cycles from 01/18/2018 to 03/29/2018   Current Therapy:  surveillance   HISTORY OF PRESENT ILLNESS:   Oncology History  Small cell lung cancer (HCC)  12/30/2017 Initial Diagnosis   Small cell lung cancer (HCC)   01/18/2018 - 03/29/2018 Chemotherapy   The patient had palonosetron (ALOXI) injection 0.25 mg, 0.25 mg, Intravenous,  Once, 4  of 4 cycles Administration: 0.25 mg (01/18/2018), 0.25 mg (02/08/2018), 0.25 mg (03/06/2018), 0.25 mg (03/27/2018) CARBOplatin (PARAPLATIN) 300 mg in sodium chloride 0.9 % 250 mL chemo infusion, 300 mg (100 % of original dose 300 mg), Intravenous,  Once, 4 of 4 cycles Dose modification: 300 mg (original dose 300 mg, Cycle 1), 409.5 mg (original dose 409.5 mg, Cycle 2),   (original dose 409.5 mg, Cycle 3), 409.5 mg (original dose 409.5 mg, Cycle 4) Administration: 300 mg (01/18/2018), 410 mg (02/08/2018), 410 mg (03/06/2018), 410 mg (03/27/2018) etoposide (VEPESID) 160 mg in sodium chloride 0.9 % 500 mL chemo infusion, 90 mg/m2 = 160 mg (90 % of original dose 100  mg/m2), Intravenous,  Once, 4 of 4 cycles Dose modification: 90 mg/m2 (90 % of original dose 100 mg/m2, Cycle 1, Reason: Patient Age) Administration: 160 mg (01/18/2018), 170 mg (01/19/2018), 170 mg (02/10/2018), 170 mg (02/08/2018), 170 mg (02/09/2018), 170 mg (01/20/2018), 170 mg (03/07/2018), 170 mg (03/06/2018), 170 mg (03/08/2018), 170 mg (03/27/2018), 170 mg (03/28/2018), 170 mg (03/29/2018) fosaprepitant (EMEND) 150 mg, dexamethasone (DECADRON) 12 mg in sodium chloride 0.9 % 145 mL IVPB, , Intravenous,  Once, 4 of 4 cycles Administration:  (01/18/2018),  (02/08/2018),  (03/06/2018),  (03/27/2018)  for chemotherapy treatment.       INTERVAL HISTORY:   Terry Boyd is a 82 y.o. male presenting to clinic today for follow up of limited stage small cell lung cancer. He was last seen by me on 05/10/22.  Since his last visit, he underwent surveillance chest CT on 12/07/22 showing: improving areas of bronchial wall thickening along LLL; some residual diffuse areas of central bronchial wall and tracheal thickening; no developing new mass lesion, fluid collection, or lymph node enlargement; stable thickening in the hila bilaterally.  Today, he states that he is doing well overall. His appetite level is at 60%. His energy level is at 60%.  PAST MEDICAL HISTORY:   Past Medical History: Past Medical History:  Diagnosis Date   AAA (abdominal aortic aneurysm) (HCC)    EVAR in 2011 by Dr. Darrick Penna   Arthritis    Chronic back pain    COPD (chronic obstructive pulmonary disease) (HCC)    Depression    Essential hypertension    GERD (gastroesophageal reflux disease)    History of hiatal hernia    Hypothyroidism    Peripheral vascular disease (HCC)    Small cell lung cancer (HCC)    Carboplatin and etoposide x 4 cycles from 01/18/2018 to 03/29/2018    Surgical History: Past Surgical History:  Procedure Laterality Date   ABDOMINAL AORTAGRAM N/A 03/14/2012   Procedure: ABDOMINAL Ronny Flurry;  Surgeon: Nada Libman, MD;  Location: [facility] CATH LAB;  Service: Cardiovascular;  Laterality: N/A;   ABDOMINAL AORTIC ANEURYSM REPAIR  04-22-2009   Stent graft repair of AAA   APPENDECTOMY     BRONCHIAL BRUSHINGS Right 11/30/2017   Procedure: BRONCHIAL BRUSHINGS;  Surgeon: Kari Baars, MD;  Location: [facility] ENDO SUITE;  Service: Cardiopulmonary;  Laterality: Right;   BRONCHIAL WASHINGS Right 11/30/2017   Procedure: BRONCHIAL WASHINGS;  Surgeon: Kari Baars, MD;  Location: [facility] SUITE;  Service: Cardiopulmonary;  Laterality: Right;   BYPASS GRAFT POPLITEAL TO POPLITEAL  03/20/2012   Procedure: BYPASS GRAFT POPLITEAL TO POPLITEAL;  Surgeon: Sherren Kerns, MD;  Location: [facility] OR;  Service: Vascular;  Laterality: Left;  ABOVE THE KNEE POPLITEAL ARTERY TO BELOW THE KNEE POPLITEAL ARTERY BYPASS GRAFT USING REVERSE LEFT GREATER SAPHENOUS VEIN    CATARACT  EXTRACTION W/PHACO Right 01/25/2022   Procedure: CATARACT EXTRACTION PHACO AND INTRAOCULAR LENS PLACEMENT (IOC);  Surgeon: Fabio Pierce, MD;  Location: [facility] ORS;  Service: Ophthalmology;  Laterality: Right;  CDE: 7.80   CATARACT EXTRACTION W/PHACO Left 02/08/2022   Procedure: CATARACT EXTRACTION PHACO AND INTRAOCULAR LENS PLACEMENT (IOC);  Surgeon: Fabio Pierce, MD;  Location: [facility] ORS;  Service: Ophthalmology;  Laterality: Left;  CDE 9.55   ENDARTERECTOMY POPLITEAL  03/20/2012   Procedure: ENDARTERECTOMY POPLITEAL;  Surgeon: Sherren Kerns, MD;  Location: [facility] OR;  Service: Vascular;  Laterality: Left;   LIGATION OF LEFT LEG POPLITEAL ANEURYSM   ESOPHAGOGASTRODUODENOSCOPY  09/28/2017   Dr. Benedetto Coons: Medium sized hiatal hernia, residual food.  Suspect gastroparesis.  Gastric atony.   ESOPHAGOGASTRODUODENOSCOPY  01/05/2018   [facility].  Large amount of solid food upon entry to the stomach.  Pylorus appeared patent.  Normal duodenal mucosa without evidence of obstruction to the second portion of duodenum.  Balloon dilation performed of the pylorus.   FLEXIBLE  BRONCHOSCOPY N/A 11/30/2017   Procedure: FLEXIBLE BRONCHOSCOPY;  Surgeon: Kari Baars, MD;  Location: [facility];  Service: Cardiopulmonary;  Laterality: N/A;   INTRAOPERATIVE ARTERIOGRAM  03/20/2012   Procedure: INTRA OPERATIVE ARTERIOGRAM;  Surgeon: Sherren Kerns, MD;  Location: [facility] OR;  Service: Vascular;  Laterality: Left;  TIMES ONE   PORTACATH PLACEMENT Left 01/06/2018   Procedure: INSERTION PORT-A-CATH;  Surgeon: Franky Macho, MD;  Location: [facility] ORS;  Service: General;  Laterality: Left;   VASECTOMY  1982   VIDEO BRONCHOSCOPY WITH ENDOBRONCHIAL ULTRASOUND N/A 12/23/2017   Procedure: VIDEO BRONCHOSCOPY WITH ENDOBRONCHIAL ULTRASOUND;  Surgeon: Loreli Slot, MD;  Location: [facility] OR;  Service: Thoracic;  Laterality: N/A;    Social History: Social History   Socioeconomic History   Marital status: Divorced    Spouse name: Not on file   Number of children: 2   Years of education: Not on file   Highest education level: Not on file  Occupational History   Occupation: [place]     Comment: [employer] exposure   Occupation: Secondary school teacher    Comment: [employer] exsposure  Tobacco Use   Smoking status: Every Day    Types: Cigars   Smokeless tobacco: Never  Vaping Use   Vaping status: Never Used  Substance and Sexual Activity   Alcohol use: No   Drug use: No   Sexual activity: Not on file  Other Topics Concern   Not on file  Social History Narrative   Sedentary   Social Determinants of Health   Financial Resource Strain: Low Risk  (03/24/2020)   Overall Financial Resource Strain (CARDIA)    Difficulty of Paying Living Expenses: Not very hard  Food Insecurity: No Food Insecurity (03/24/2020)   Hunger Vital Sign    Worried About Running Out of Food in the Last Year: Never true    Ran Out of Food in the Last Year: Never true  Transportation Needs: No Transportation Needs (03/24/2020)   PRAPARE - Administrator, Civil Service (Medical): No    Lack of  Transportation (Non-Medical): No  Physical Activity: Inactive (03/24/2020)   Exercise Vital Sign    Days of Exercise per Week: 0 days    Minutes of Exercise per Session: 0 min  Stress: No Stress Concern Present (03/24/2020)   [employer] of Occupational Health - Occupational Stress Questionnaire    Feeling of Stress : Not at all  Social Connections: Moderately Isolated (03/24/2020)  Social Connection and Isolation Panel [NHANES]    Frequency of Communication with Friends and Family: Three times a week    Frequency of Social Gatherings with Friends and Family: Twice a week    Attends Religious Services: 1 to 4 times per year    Active Member of [employer] or Organizations: No    Attends Banker Meetings: Never    Marital Status: Divorced  Catering manager Violence: Not At Risk (03/24/2020)   Humiliation, Afraid, Rape, and Kick questionnaire    Fear of Current or Ex-Partner: No    Emotionally Abused: No    Physically Abused: No    Sexually Abused: No    Family History: Family History  Problem Relation Age of Onset   Diabetes Mother    Stroke Mother    Heart disease Father    Diabetes Father    Heart disease Sister        CABG x 4   Cancer Brother        throat cancer   Diabetes Sister    Heart disease Sister    Cancer Brother        pancreatic   Prostate cancer Brother    Kidney disease Son    Colon cancer Neg Hx     Current Medications:  Current Outpatient Medications:    Ipratropium-Albuterol (COMBIVENT) 20-100 MCG/ACT AERS respimat, Inhale 1 puff into the lungs every 6 (six) hours as needed for wheezing., Disp: , Rfl:    levothyroxine (SYNTHROID) 137 MCG tablet, Take 137 mcg by mouth every other day. Alternates with 150 mcg, Disp: , Rfl:    LORazepam (ATIVAN) 1 MG tablet, Take 1 tablet (1 mg total) by mouth 2 (two) times daily., Disp: 60 tablet, Rfl: 3   predniSONE (DELTASONE) 10 MG tablet, Take 10 mg by mouth., Disp: , Rfl:    Allergies: Allergies   Allergen Reactions   Morphine And Codeine Shortness Of Breath and Other (See Comments)    Hallucinations pt has taken hydromorphone before    REVIEW OF SYSTEMS:   Review of Systems  Constitutional:  Negative for chills, fatigue and fever.  HENT:   Negative for lump/mass, mouth sores, nosebleeds, sore throat and trouble swallowing.   Eyes:  Negative for eye problems.  Respiratory:  Positive for cough and shortness of breath.   Cardiovascular:  Negative for chest pain, leg swelling and palpitations.  Gastrointestinal:  Negative for abdominal pain, constipation, diarrhea, nausea and vomiting.  Genitourinary:  Negative for bladder incontinence, difficulty urinating, dysuria, frequency, hematuria and nocturia.   Musculoskeletal:  Negative for arthralgias, back pain, flank pain, myalgias and neck pain.  Skin:  Negative for itching and rash.  Neurological:  Negative for dizziness, headaches and numbness.  Hematological:  Does not bruise/bleed easily.  Psychiatric/Behavioral:  Negative for depression, sleep disturbance and suicidal ideas. The patient is not nervous/anxious.   All other systems reviewed and are negative.    VITALS:   Blood pressure (!) 141/85, pulse 73, temperature 97.6 F (36.4 C), temperature source Oral, resp. rate 19, height 5\' 9"  (1.753 m), weight 144 lb 11.2 oz (65.6 kg), SpO2 97%.  Wt Readings from Last 3 Encounters:  12/15/22 144 lb 11.2 oz (65.6 kg)  05/10/22 150 lb 12.8 oz (68.4 kg)  04/23/22 151 lb (68.5 kg)    Body mass index is 21.37 kg/m.  Performance status (ECOG): 1 - Symptomatic but completely ambulatory  PHYSICAL EXAM:   Physical Exam Vitals and nursing note reviewed. Exam conducted  with a chaperone present.  Constitutional:      Appearance: Normal appearance.  Cardiovascular:     Rate and Rhythm: Normal rate and regular rhythm.     Pulses: Normal pulses.     Heart sounds: Normal heart sounds.  Pulmonary:     Effort: Pulmonary effort is  normal.     Breath sounds: Normal breath sounds.  Abdominal:     Palpations: Abdomen is soft. There is no hepatomegaly, splenomegaly or mass.     Tenderness: There is no abdominal tenderness.  Musculoskeletal:     Right lower leg: No edema.     Left lower leg: No edema.  Lymphadenopathy:     Cervical: No cervical adenopathy.     Right cervical: No superficial, deep or posterior cervical adenopathy.    Left cervical: No superficial, deep or posterior cervical adenopathy.     Upper Body:     Right upper body: No supraclavicular or axillary adenopathy.     Left upper body: No supraclavicular or axillary adenopathy.  Neurological:     General: No focal deficit present.     Mental Status: He is alert and oriented to person, place, and time.  Psychiatric:        Mood and Affect: Mood normal.        Behavior: Behavior normal.     LABS:      Latest Ref Rng & Units 12/07/2022    9:39 AM 04/01/2022   10:35 AM 09/23/2021    9:14 AM  CBC  WBC 4.0 - 10.5 K/uL 6.7  9.8  7.7   Hemoglobin 13.0 - 17.0 g/dL 19.1  47.8  29.5   Hematocrit 39.0 - 52.0 % 46.4  43.8  46.2   Platelets 150 - 400 K/uL 144  222  149       Latest Ref Rng & Units 12/07/2022    9:39 AM 04/01/2022   10:35 AM 09/23/2021    9:14 AM  CMP  Glucose 70 - 99 mg/dL 621  308  657   BUN 8 - 23 mg/dL 20  15  12    Creatinine 0.61 - 1.24 mg/dL 8.46  9.62  9.52   Sodium 135 - 145 mmol/L 140  138  138   Potassium 3.5 - 5.1 mmol/L 4.2  4.0  4.7   Chloride 98 - 111 mmol/L 105  104  100   CO2 22 - 32 mmol/L 26  24  29    Calcium 8.9 - 10.3 mg/dL 8.4  8.2  8.9   Total Protein 6.5 - 8.1 g/dL 7.4  7.5  7.7   Total Bilirubin 0.3 - 1.2 mg/dL 0.5  0.5  0.8   Alkaline Phos 38 - 126 U/L 61  72  66   AST 15 - 41 U/L 14  18  17    ALT 0 - 44 U/L 11  19  13       No results found for: "CEA1", "CEA" / No results found for: "CEA1", "CEA" No results found for: "PSA1" No results found for: "WUX324" No results found for: "CAN125"  No results  found for: "TOTALPROTELP", "ALBUMINELP", "A1GS", "A2GS", "BETS", "BETA2SER", "GAMS", "MSPIKE", "SPEI" No results found for: "TIBC", "FERRITIN", "IRONPCTSAT" Lab Results  Component Value Date   LDH 123 04/06/2019     STUDIES:   CT Chest W Contrast  Result Date: 12/15/2022 CLINICAL DATA:  Monitor small-cell lung cancer. Status post chemotherapy. COPD and hypertension. * Tracking Code: BO * EXAM: CT CHEST WITH  CONTRAST TECHNIQUE: Multidetector CT imaging of the chest was performed during intravenous contrast administration. RADIATION DOSE REDUCTION: This exam was performed according to the departmental dose-optimization program which includes automated exposure control, adjustment of the mA and/or kV according to patient size and/or use of iterative reconstruction technique. CONTRAST:  75mL OMNIPAQUE IOHEXOL 300 MG/ML  SOLN COMPARISON:  CT 04/01/2022 and older FINDINGS: Cardiovascular: There is a left subclavian chest port in place with tip seen as far as the upper SVC brachiocephalic junction. The brachiocephalic vein is atretic. Small pericardial effusion. The heart nonenlarged. Coronary artery calcifications are seen. The thoracic aorta has a normal course and caliber with some atherosclerotic changes. Small area of penetrating ulcer small saccular aneurysm identified along the inferior aspect of the aortic arch best seen on sagittal series 5, image 15, unchanged from previous. Coronary artery calcifications are seen. Mediastinum/Nodes: There is a patulous esophagus with some luminal fluid. Mild areas of wall thickening along the trachea and proximal central bronchi with some fluid as well along the left main bronchus. Slight distortion of the bronchi in the right lung hilum. No specific abnormal lymph node enlargement identified in the axillary regions, hilum or mediastinum. There is some thickening along the soft tissue surrounding the hila, right-greater-than-left, similar to previous examination.  Small thyroid gland. Lungs/Pleura: Centrilobular emphysematous changes are identified. There are peripheral areas of interstitial septal thickening identified in both lungs, scarring and fibrotic change. No consolidation, pneumothorax or effusion. There are some areas of bronchial wall thickening noted scattered. Overall significantly improved in the left lower lobe compared to the prior examination of February 2021. No consolidation, pneumothorax or effusion. No new dominant lung nodule. Upper Abdomen: Stable right adrenal nodule. Please correlate with previous workup. There is an aortic endograft noted but this has a of the very edge of the imaging field and not evaluated on this chest examination. Musculoskeletal: Scattered degenerative changes of the spine. Multilevel disc height loss with osteophyte formation. There is some kyphosis as well. Some areas of stenosis. IMPRESSION: Improving areas of bronchial wall thickening along the left lower lobe. Some residual diffuse areas of central bronchial wall and tracheal thickening. No developing new mass lesion, fluid collection or lymph node enlargement. Stable thickening in the hila bilaterally. Fluid along the trachea and left main bronchus is also distended esophagus with luminal fluid. Please correlate with symptoms Aortic Atherosclerosis (ICD10-I70.0) and Emphysema (ICD10-J43.9). Electronically Signed   By: Karen Kays M.D.   On: 12/15/2022 10:10

## 2023-01-07 DIAGNOSIS — E039 Hypothyroidism, unspecified: Secondary | ICD-10-CM | POA: Diagnosis not present

## 2023-01-07 DIAGNOSIS — J449 Chronic obstructive pulmonary disease, unspecified: Secondary | ICD-10-CM | POA: Diagnosis not present

## 2023-01-07 DIAGNOSIS — E1165 Type 2 diabetes mellitus with hyperglycemia: Secondary | ICD-10-CM | POA: Diagnosis not present

## 2023-01-12 DIAGNOSIS — E039 Hypothyroidism, unspecified: Secondary | ICD-10-CM | POA: Diagnosis not present

## 2023-01-12 DIAGNOSIS — Z681 Body mass index (BMI) 19 or less, adult: Secondary | ICD-10-CM | POA: Diagnosis not present

## 2023-01-12 DIAGNOSIS — I724 Aneurysm of artery of lower extremity: Secondary | ICD-10-CM | POA: Diagnosis not present

## 2023-01-12 DIAGNOSIS — I7090 Unspecified atherosclerosis: Secondary | ICD-10-CM | POA: Diagnosis not present

## 2023-01-12 DIAGNOSIS — I700 Atherosclerosis of aorta: Secondary | ICD-10-CM | POA: Diagnosis not present

## 2023-01-12 DIAGNOSIS — Z85118 Personal history of other malignant neoplasm of bronchus and lung: Secondary | ICD-10-CM | POA: Diagnosis not present

## 2023-01-12 DIAGNOSIS — F17200 Nicotine dependence, unspecified, uncomplicated: Secondary | ICD-10-CM | POA: Diagnosis not present

## 2023-03-10 DIAGNOSIS — H26491 Other secondary cataract, right eye: Secondary | ICD-10-CM | POA: Diagnosis not present

## 2023-03-10 DIAGNOSIS — H353132 Nonexudative age-related macular degeneration, bilateral, intermediate dry stage: Secondary | ICD-10-CM | POA: Diagnosis not present

## 2023-03-10 DIAGNOSIS — H01002 Unspecified blepharitis right lower eyelid: Secondary | ICD-10-CM | POA: Diagnosis not present

## 2023-03-10 DIAGNOSIS — H01001 Unspecified blepharitis right upper eyelid: Secondary | ICD-10-CM | POA: Diagnosis not present

## 2023-04-05 ENCOUNTER — Other Ambulatory Visit: Payer: Self-pay

## 2023-04-05 DIAGNOSIS — C3490 Malignant neoplasm of unspecified part of unspecified bronchus or lung: Secondary | ICD-10-CM

## 2023-04-05 MED ORDER — LORAZEPAM 1 MG PO TABS: 1.0000 mg | ORAL_TABLET | Freq: Two times a day (BID) | ORAL | 3 refills | Status: DC

## 2023-05-17 DIAGNOSIS — Z681 Body mass index (BMI) 19 or less, adult: Secondary | ICD-10-CM | POA: Diagnosis not present

## 2023-05-17 DIAGNOSIS — H9193 Unspecified hearing loss, bilateral: Secondary | ICD-10-CM | POA: Diagnosis not present

## 2023-06-08 ENCOUNTER — Inpatient Hospital Stay: Payer: Non-veteran care | Attending: Hematology

## 2023-06-08 ENCOUNTER — Ambulatory Visit (HOSPITAL_COMMUNITY)
Admission: RE | Admit: 2023-06-08 | Discharge: 2023-06-08 | Disposition: A | Discharge status: Home / Self Care | Payer: Non-veteran care | Source: Ambulatory Visit | Attending: Hematology | Admitting: Hematology

## 2023-06-08 DIAGNOSIS — C3490 Malignant neoplasm of unspecified part of unspecified bronchus or lung: Secondary | ICD-10-CM | POA: Insufficient documentation

## 2023-06-08 DIAGNOSIS — F17210 Nicotine dependence, cigarettes, uncomplicated: Secondary | ICD-10-CM | POA: Insufficient documentation

## 2023-06-08 DIAGNOSIS — J432 Centrilobular emphysema: Secondary | ICD-10-CM | POA: Diagnosis not present

## 2023-06-08 LAB — COMPREHENSIVE METABOLIC PANEL WITH GFR
ALT: 10 U/L (ref 0–44)
AST: 15 U/L (ref 15–41)
Albumin: 3.6 g/dL (ref 3.5–5.0)
Alkaline Phosphatase: 63 U/L (ref 38–126)
Anion gap: 9 (ref 5–15)
BUN: 24 mg/dL — ABNORMAL HIGH (ref 8–23)
CO2: 28 mmol/L (ref 22–32)
Calcium: 8.7 mg/dL — ABNORMAL LOW (ref 8.9–10.3)
Chloride: 102 mmol/L (ref 98–111)
Creatinine, Ser: 0.88 mg/dL (ref 0.61–1.24)
GFR, Estimated: >60 mL/min (ref >60)
Glucose, Bld: 72 mg/dL (ref 70–99)
Potassium: 4.5 mmol/L (ref 3.5–5.1)
Sodium: 139 mmol/L (ref 135–145)
Total Bilirubin: 0.6 mg/dL (ref 0.0–1.2)
Total Protein: 7.2 g/dL (ref 6.5–8.1)

## 2023-06-08 LAB — CBC WITH DIFFERENTIAL/PLATELET
Abs Immature Granulocytes: 0.04 10*3/uL (ref 0.00–0.07)
Basophils Absolute: 0.1 10*3/uL (ref 0.0–0.1)
Basophils Relative: 1 %
Eosinophils Absolute: 0.2 10*3/uL (ref 0.0–0.5)
Eosinophils Relative: 3 %
HCT: 45.8 % (ref 39.0–52.0)
Hemoglobin: 14.6 g/dL (ref 13.0–17.0)
Immature Granulocytes: 1 %
Lymphocytes Relative: 18 %
Lymphs Abs: 1.4 10*3/uL (ref 0.7–4.0)
MCH: 29.9 pg (ref 26.0–34.0)
MCHC: 31.9 g/dL (ref 30.0–36.0)
MCV: 93.9 fL (ref 80.0–100.0)
Monocytes Absolute: 0.6 10*3/uL (ref 0.1–1.0)
Monocytes Relative: 7 %
Neutro Abs: 5.5 10*3/uL (ref 1.7–7.7)
Neutrophils Relative %: 70 %
Platelets: 173 10*3/uL (ref 150–400)
RBC: 4.88 MIL/uL (ref 4.22–5.81)
RDW: 13.6 % (ref 11.5–15.5)
WBC: 7.8 10*3/uL (ref 4.0–10.5)
nRBC: 0 % (ref 0.0–0.2)

## 2023-06-08 LAB — MAGNESIUM: Magnesium: 2 mg/dL (ref 1.7–2.4)

## 2023-06-08 MED ORDER — IOHEXOL 300 MG/ML  SOLN
75.0000 mL | Freq: Once | INTRAMUSCULAR | Status: AC | PRN
  Administered 2023-06-08: 75 mL via INTRAVENOUS

## 2023-06-09 DIAGNOSIS — H26491 Other secondary cataract, right eye: Secondary | ICD-10-CM | POA: Diagnosis not present

## 2023-06-15 ENCOUNTER — Inpatient Hospital Stay: Payer: Non-veteran care | Admitting: Hematology

## 2023-06-15 DIAGNOSIS — C3490 Malignant neoplasm of unspecified part of unspecified bronchus or lung: Secondary | ICD-10-CM | POA: Diagnosis not present

## 2023-06-15 NOTE — Progress Notes (Unsigned)
 Virtual Visit via Telephone Note  I connected with Terry Boyd on 06/15/23 at  2:45 PM EDT by telephone and verified that I am speaking with the correct person using two identifiers.  Location: Patient: At home Provider: In the office   I discussed the limitations, risks, security and privacy concerns of performing an evaluation and management service by telephone and the availability of in person appointments. I also discussed with the patient that there may be a patient responsible charge related to this service. The patient expressed understanding and agreed to proceed.   History of Present Illness: He is seen in our clinic for follow-up of limited stage small cell lung cancer.  He underwent chemoradiation therapy which was completed in January 2020.  Since then he has been on surveillance.   Observations/Objective: He denies any worsening of his baseline breathing status.  Denies any recent infections.  Denies any change in his baseline cough or hemoptysis.  Assessment and Plan:  1.  Limited stage small cell lung cancer: - He continues to smoke 10 cigarettes/day.  Denies any change in cough or hemoptysis.  Denies any recent respiratory infections. - Reviewed CT chest with contrast (06/08/2023): 19 mm focus of subsolid opacity in the deep posterior left close to phrenic sulcus is similar to minimally progressive.  No other new findings seen.  Other benign findings were discussed with the patient. - We have also reviewed labs from 06/08/2023: LFTs are normal.  CBC was grossly normal. - Recommend follow-up in 6 months with repeat imaging and labs.   Follow Up Instructions: RTC 6 months with CT scan and labs   I discussed the assessment and treatment plan with the patient. The patient was provided an opportunity to ask questions and all were answered. The patient agreed with the plan and demonstrated an understanding of the instructions.   The patient was advised to call back or seek  an in-person evaluation if the symptoms worsen or if the condition fails to improve as anticipated.  I provided 21 minutes of non-face-to-face time during this encounter.   Paulett Boros, MD

## 2023-07-13 DIAGNOSIS — E782 Mixed hyperlipidemia: Secondary | ICD-10-CM | POA: Diagnosis not present

## 2023-07-13 DIAGNOSIS — Z0001 Encounter for general adult medical examination with abnormal findings: Secondary | ICD-10-CM | POA: Diagnosis not present

## 2023-07-13 DIAGNOSIS — E559 Vitamin D deficiency, unspecified: Secondary | ICD-10-CM | POA: Diagnosis not present

## 2023-07-13 DIAGNOSIS — E039 Hypothyroidism, unspecified: Secondary | ICD-10-CM | POA: Diagnosis not present

## 2023-07-13 DIAGNOSIS — E1165 Type 2 diabetes mellitus with hyperglycemia: Secondary | ICD-10-CM | POA: Diagnosis not present

## 2023-07-19 DIAGNOSIS — Z681 Body mass index (BMI) 19 or less, adult: Secondary | ICD-10-CM | POA: Diagnosis not present

## 2023-07-19 DIAGNOSIS — J431 Panlobular emphysema: Secondary | ICD-10-CM | POA: Diagnosis not present

## 2023-07-19 DIAGNOSIS — Z0001 Encounter for general adult medical examination with abnormal findings: Secondary | ICD-10-CM | POA: Diagnosis not present

## 2023-07-19 DIAGNOSIS — C3490 Malignant neoplasm of unspecified part of unspecified bronchus or lung: Secondary | ICD-10-CM | POA: Diagnosis not present

## 2023-07-22 DIAGNOSIS — Z681 Body mass index (BMI) 19 or less, adult: Secondary | ICD-10-CM | POA: Diagnosis not present

## 2023-07-22 DIAGNOSIS — Z0001 Encounter for general adult medical examination with abnormal findings: Secondary | ICD-10-CM | POA: Diagnosis not present

## 2023-07-22 DIAGNOSIS — Z1389 Encounter for screening for other disorder: Secondary | ICD-10-CM | POA: Diagnosis not present

## 2023-08-02 ENCOUNTER — Other Ambulatory Visit: Payer: Self-pay | Admitting: *Deleted

## 2023-08-02 DIAGNOSIS — C3490 Malignant neoplasm of unspecified part of unspecified bronchus or lung: Secondary | ICD-10-CM

## 2023-08-03 MED ORDER — LORAZEPAM 1 MG PO TABS: 1.0000 mg | ORAL_TABLET | Freq: Two times a day (BID) | ORAL | 3 refills | Status: DC

## 2023-08-22 DIAGNOSIS — J431 Panlobular emphysema: Secondary | ICD-10-CM | POA: Diagnosis not present

## 2023-08-22 DIAGNOSIS — Z681 Body mass index (BMI) 19 or less, adult: Secondary | ICD-10-CM | POA: Diagnosis not present

## 2023-08-22 DIAGNOSIS — C3490 Malignant neoplasm of unspecified part of unspecified bronchus or lung: Secondary | ICD-10-CM | POA: Diagnosis not present

## 2023-09-13 DIAGNOSIS — Z23 Encounter for immunization: Secondary | ICD-10-CM | POA: Diagnosis not present

## 2023-09-13 DIAGNOSIS — Z681 Body mass index (BMI) 19 or less, adult: Secondary | ICD-10-CM | POA: Diagnosis not present

## 2023-09-13 DIAGNOSIS — J431 Panlobular emphysema: Secondary | ICD-10-CM | POA: Diagnosis not present

## 2023-09-13 DIAGNOSIS — C3490 Malignant neoplasm of unspecified part of unspecified bronchus or lung: Secondary | ICD-10-CM | POA: Diagnosis not present

## 2023-09-20 DIAGNOSIS — M79605 Pain in left leg: Secondary | ICD-10-CM | POA: Diagnosis not present

## 2023-09-20 DIAGNOSIS — M79604 Pain in right leg: Secondary | ICD-10-CM | POA: Diagnosis not present

## 2023-09-20 DIAGNOSIS — Z681 Body mass index (BMI) 19 or less, adult: Secondary | ICD-10-CM | POA: Diagnosis not present

## 2023-09-22 DIAGNOSIS — H353132 Nonexudative age-related macular degeneration, bilateral, intermediate dry stage: Secondary | ICD-10-CM | POA: Diagnosis not present

## 2023-09-29 DIAGNOSIS — N390 Urinary tract infection, site not specified: Secondary | ICD-10-CM | POA: Diagnosis not present

## 2023-09-29 DIAGNOSIS — R509 Fever, unspecified: Secondary | ICD-10-CM | POA: Diagnosis not present

## 2023-09-29 DIAGNOSIS — Z681 Body mass index (BMI) 19 or less, adult: Secondary | ICD-10-CM | POA: Diagnosis not present

## 2023-09-29 DIAGNOSIS — D519 Vitamin B12 deficiency anemia, unspecified: Secondary | ICD-10-CM | POA: Diagnosis not present

## 2023-09-29 DIAGNOSIS — G459 Transient cerebral ischemic attack, unspecified: Secondary | ICD-10-CM | POA: Diagnosis not present

## 2023-09-29 DIAGNOSIS — Z1321 Encounter for screening for nutritional disorder: Secondary | ICD-10-CM | POA: Diagnosis not present

## 2023-09-29 DIAGNOSIS — E039 Hypothyroidism, unspecified: Secondary | ICD-10-CM | POA: Diagnosis not present

## 2023-09-29 DIAGNOSIS — R300 Dysuria: Secondary | ICD-10-CM | POA: Diagnosis not present

## 2023-09-29 DIAGNOSIS — R251 Tremor, unspecified: Secondary | ICD-10-CM | POA: Diagnosis not present

## 2023-09-30 ENCOUNTER — Other Ambulatory Visit (HOSPITAL_COMMUNITY): Payer: Self-pay | Admitting: Family Medicine

## 2023-09-30 DIAGNOSIS — G459 Transient cerebral ischemic attack, unspecified: Secondary | ICD-10-CM

## 2023-10-11 ENCOUNTER — Ambulatory Visit (HOSPITAL_COMMUNITY)
Admission: RE | Admit: 2023-10-11 | Discharge: 2023-10-11 | Disposition: A | Discharge status: Home / Self Care | Source: Ambulatory Visit | Attending: Family Medicine | Admitting: Family Medicine

## 2023-10-11 DIAGNOSIS — I639 Cerebral infarction, unspecified: Secondary | ICD-10-CM | POA: Diagnosis not present

## 2023-10-11 DIAGNOSIS — R9089 Other abnormal findings on diagnostic imaging of central nervous system: Secondary | ICD-10-CM | POA: Diagnosis not present

## 2023-10-11 DIAGNOSIS — R531 Weakness: Secondary | ICD-10-CM | POA: Diagnosis not present

## 2023-10-11 DIAGNOSIS — G459 Transient cerebral ischemic attack, unspecified: Secondary | ICD-10-CM | POA: Insufficient documentation

## 2023-10-11 DIAGNOSIS — I6782 Cerebral ischemia: Secondary | ICD-10-CM | POA: Diagnosis not present

## 2023-10-13 ENCOUNTER — Other Ambulatory Visit: Payer: Self-pay | Admitting: *Deleted

## 2023-10-13 DIAGNOSIS — C3490 Malignant neoplasm of unspecified part of unspecified bronchus or lung: Secondary | ICD-10-CM

## 2023-10-13 MED ORDER — LORAZEPAM 1 MG PO TABS: 1.0000 mg | ORAL_TABLET | Freq: Two times a day (BID) | ORAL | 3 refills | Status: DC

## 2023-12-12 ENCOUNTER — Inpatient Hospital Stay: Attending: Physician Assistant

## 2023-12-12 ENCOUNTER — Ambulatory Visit (HOSPITAL_COMMUNITY)
Admission: RE | Admit: 2023-12-12 | Discharge: 2023-12-12 | Disposition: A | Discharge status: Home / Self Care | Source: Ambulatory Visit | Attending: Hematology | Admitting: Hematology

## 2023-12-12 DIAGNOSIS — R5382 Chronic fatigue, unspecified: Secondary | ICD-10-CM | POA: Insufficient documentation

## 2023-12-12 DIAGNOSIS — Z8042 Family history of malignant neoplasm of prostate: Secondary | ICD-10-CM | POA: Insufficient documentation

## 2023-12-12 DIAGNOSIS — Z85118 Personal history of other malignant neoplasm of bronchus and lung: Secondary | ICD-10-CM | POA: Insufficient documentation

## 2023-12-12 DIAGNOSIS — R059 Cough, unspecified: Secondary | ICD-10-CM | POA: Insufficient documentation

## 2023-12-12 DIAGNOSIS — R0609 Other forms of dyspnea: Secondary | ICD-10-CM | POA: Insufficient documentation

## 2023-12-12 DIAGNOSIS — C3490 Malignant neoplasm of unspecified part of unspecified bronchus or lung: Secondary | ICD-10-CM | POA: Insufficient documentation

## 2023-12-12 DIAGNOSIS — Z79899 Other long term (current) drug therapy: Secondary | ICD-10-CM | POA: Insufficient documentation

## 2023-12-12 DIAGNOSIS — Z08 Encounter for follow-up examination after completed treatment for malignant neoplasm: Secondary | ICD-10-CM | POA: Insufficient documentation

## 2023-12-12 DIAGNOSIS — F17210 Nicotine dependence, cigarettes, uncomplicated: Secondary | ICD-10-CM | POA: Insufficient documentation

## 2023-12-12 DIAGNOSIS — Z808 Family history of malignant neoplasm of other organs or systems: Secondary | ICD-10-CM | POA: Insufficient documentation

## 2023-12-12 DIAGNOSIS — J432 Centrilobular emphysema: Secondary | ICD-10-CM | POA: Diagnosis not present

## 2023-12-12 LAB — CBC WITH DIFFERENTIAL/PLATELET
Abs Immature Granulocytes: 0.03 K/uL (ref 0.00–0.07)
Basophils Absolute: 0.1 K/uL (ref 0.0–0.1)
Basophils Relative: 1 %
Eosinophils Absolute: 0.2 K/uL (ref 0.0–0.5)
Eosinophils Relative: 3 %
HCT: 43.4 % (ref 39.0–52.0)
Hemoglobin: 14.2 g/dL (ref 13.0–17.0)
Immature Granulocytes: 0 %
Lymphocytes Relative: 25 %
Lymphs Abs: 1.8 K/uL (ref 0.7–4.0)
MCH: 30.7 pg (ref 26.0–34.0)
MCHC: 32.7 g/dL (ref 30.0–36.0)
MCV: 93.7 fL (ref 80.0–100.0)
Monocytes Absolute: 0.6 K/uL (ref 0.1–1.0)
Monocytes Relative: 9 %
Neutro Abs: 4.4 K/uL (ref 1.7–7.7)
Neutrophils Relative %: 62 %
Platelets: 167 K/uL (ref 150–400)
RBC: 4.63 MIL/uL (ref 4.22–5.81)
RDW: 13.5 % (ref 11.5–15.5)
WBC: 7.1 K/uL (ref 4.0–10.5)
nRBC: 0 % (ref 0.0–0.2)

## 2023-12-12 LAB — COMPREHENSIVE METABOLIC PANEL WITH GFR
ALT: 8 U/L (ref 0–44)
AST: 16 U/L (ref 15–41)
Albumin: 3.9 g/dL (ref 3.5–5.0)
Alkaline Phosphatase: 73 U/L (ref 38–126)
Anion gap: 8 (ref 5–15)
BUN: 19 mg/dL (ref 8–23)
CO2: 28 mmol/L (ref 22–32)
Calcium: 8.8 mg/dL — ABNORMAL LOW (ref 8.9–10.3)
Chloride: 104 mmol/L (ref 98–111)
Creatinine, Ser: 1.1 mg/dL (ref 0.61–1.24)
GFR, Estimated: >60 mL/min (ref >60)
Glucose, Bld: 118 mg/dL — ABNORMAL HIGH (ref 70–99)
Potassium: 4.6 mmol/L (ref 3.5–5.1)
Sodium: 140 mmol/L (ref 135–145)
Total Bilirubin: 0.5 mg/dL (ref 0.0–1.2)
Total Protein: 7.2 g/dL (ref 6.5–8.1)

## 2023-12-12 MED ORDER — IOHEXOL 300 MG/ML  SOLN
75.0000 mL | Freq: Once | INTRAMUSCULAR | Status: AC | PRN
  Administered 2023-12-12: 75 mL via INTRAVENOUS

## 2023-12-18 NOTE — Progress Notes (Unsigned)
 [facility] [address], [place] [address]   CLINIC:  Medical Oncology/Hematology  PCP:  Trudy Vaughn FALCON, MD [address] / [place] [address] [phone]   REASON FOR VISIT:  Follow-up for limited stage small cell lung cancer (2019)  PRIOR THERAPY: Chemoradiation therapy with carboplatin  and etoposide  x 4 cycles from 01/18/2018 through 03/29/2018  CURRENT THERAPY: Surveillance per NCCN guidelines  BRIEF ONCOLOGIC HISTORY:   Oncology History  Small cell lung cancer (HCC)  12/30/2017 Initial Diagnosis   Small cell lung cancer (HCC)   01/18/2018 - 03/29/2018 Chemotherapy   The patient had palonosetron  (ALOXI ) injection 0.25 mg, 0.25 mg, Intravenous,  Once, 4 of 4 cycles Administration: 0.25 mg (01/18/2018), 0.25 mg (02/08/2018), 0.25 mg (03/06/2018), 0.25 mg (03/27/2018) CARBOplatin  (PARAPLATIN ) 300 mg in sodium chloride  0.9 % 250 mL chemo infusion, 300 mg (100 % of original dose 300 mg), Intravenous,  Once, 4 of 4 cycles Dose modification: 300 mg (original dose 300 mg, Cycle 1), 409.5 mg (original dose 409.5 mg, Cycle 2),   (original dose 409.5 mg, Cycle 3), 409.5 mg (original dose 409.5 mg, Cycle 4) Administration: 300 mg (01/18/2018), 410 mg (02/08/2018), 410 mg (03/06/2018), 410 mg (03/27/2018) etoposide  (VEPESID ) 160 mg in sodium chloride  0.9 % 500 mL chemo infusion, 90 mg/m2 = 160 mg (90 % of original dose 100 mg/m2), Intravenous,  Once, 4 of 4 cycles Dose modification: 90 mg/m2 (90 % of original dose 100 mg/m2, Cycle 1, Reason: Patient Age) Administration: 160 mg (01/18/2018), 170 mg (01/19/2018), 170 mg (02/10/2018), 170 mg (02/08/2018), 170 mg (02/09/2018), 170 mg (01/20/2018), 170 mg (03/07/2018), 170 mg (03/06/2018), 170 mg (03/08/2018), 170 mg (03/27/2018), 170 mg (03/28/2018), 170 mg (03/29/2018) fosaprepitant  (EMEND) 150 mg, dexamethasone  (DECADRON ) 12 mg in sodium chloride  0.9 % 145 mL IVPB, , Intravenous,  Once, 4 of 4 cycles Administration:   (01/18/2018),  (02/08/2018),  (03/06/2018),  (03/27/2018)  for chemotherapy treatment.      CANCER STAGING: Cancer Staging  No matching staging information was found for the patient.   INTERVAL HISTORY:   [name], a 83 y.o. male, returns for routine follow-up of his limited stage small cell lung cancer.  Vada was last seen on 06/15/2023 by Dr. Rogers.   At today's visit, he  reports feeling ***.   ***He denies any recent hospitalizations, surgeries, or changes in his  baseline health status. ***He reports ***% energy and ***% appetite.   ***He is maintaining stable weight at this time. ***He continues to smoke 0.5 PPD cigarettes.  ***New cough, dyspnea, or hemoptysis ***Recurrent lung infections ***Hoarseness, dysphagia, or voice changes ***Chest pain ***New headaches, focal weakness/numbness, confusion, slurred speech, gait instability ***New abdominal pain or new onset bone pain ***Unintentional weight loss, fatigue, fever, chills   ASSESSMENT & PLAN:  1.  Limited stage small cell lung cancer - CT of the abdomen and pelvis on 11/08/2017 showed irregular narrowing of the right lower lobe bronchus with associated right hilar adenopathy. - PET CT scan on 11/28/2017 shows lesion at the inferior lobar bronchus with SUV of 5.5 with the right hilar hypermetabolic and ipsilateral mediastinal adenopathy including right paratracheal, subcarinal and several small prevascular lymph nodes which demonstrate only low-grade metabolic activity. - On 12/23/2017 bronchoscopy and EVIS biopsy done by Dr. Kerrin shows small cell carcinoma of the right bronchus biopsy.  Lymph node aspirations were also consistent with small cell lung cancer. - Received chemoradiation therapy with carboplatin /etoposide  x 4 cycles (01/18/2018 through  03/27/2018) - He declined PCI (Prophylactic Cranial Irradiation) - MRI brain without contrast (10/11/2023) obtained by PCP due to weakness was negative for  any cranial metastatic disease. - Most recent CT chest (12/12/2023): Spiculated appearing 7 mm RUL nodule unchanged from 06/08/2023, but new from 04/01/2022.  Bronchogenic carcinoma cannot be excluded, but nodule is too small for PET resolution.  Recommend continued attention on follow-up.  Resolved subpleural consolidation in the inferior posterior lateral left lower lobe.  Small right adrenal adenoma. - Most recent labs (12/12/2023): Normal CBC/D.  CMP unremarkable with normal LFTs. - He continues to smoke 10 cigarettes/day.  *** - *** Denies any change in cough or hemoptysis.  Denies any recent respiratory infections. - PLAN: RTC in 6 months for repeat CT scan, labs, and MD visit.  *** NCCN GUIDELINES for SURVEILLANCE OF SMALL CELL LUNG CANCER AFTER COMPLETION OF INITIAL THERAPY:  LIMITED STAGE SMALL CELL LUNG CANCER Oncology follow-up visit every 3 months during year 1-2, then every 6 months during year 3, then annually Oncology visit should include H&P.  Blood work only as clinically indicated. Surveillance imaging with each oncology visit as above should include... CT chest with contrast.   Brain MRI with contrast every 3 to 4 months during year 1, then every 6 months in year 2, then as clinically indicated. Any new pulmonary nodule should initiate workup for potential new primary. Smoking cessation intervention.    2.  Tobacco use - Smoked 1 PPD x 60 years - Since diagnosis of small cell lung cancer in 2019, cut back to 0.5 PPD  PLAN SUMMARY: >> CT chest with contrast due 08/12/2024 *** >> Labs in 6 months = CBC/D, CMP >> MD VISIT (DR. KANDALA) in 6 months (1 week after labs/CT scan) **Alternating MD/APP visits ***   REVIEW OF SYSTEMS: ***  Review of Systems - Oncology  PHYSICAL EXAM:   Performance status (ECOG): {CHL ONC ED:[id number]} *** There were no vitals filed for this visit. Wt Readings from Last 3 Encounters:  12/15/22 144 lb 11.2 oz (65.6 kg)  05/10/22 150 lb 12.8 oz  (68.4 kg)  04/23/22 151 lb (68.5 kg)   Physical Exam   PAST MEDICAL/SURGICAL HISTORY:  Past Medical History:  Diagnosis Date   AAA (abdominal aortic aneurysm)    EVAR in 2011 by Dr. Harvey   Arthritis    Chronic back pain    COPD (chronic obstructive pulmonary disease) (HCC)    Depression    Essential hypertension    GERD (gastroesophageal reflux disease)    History of hiatal hernia    Hypothyroidism    Peripheral vascular disease    Small cell lung cancer (HCC)    Carboplatin  and etoposide  x 4 cycles from 01/18/2018 to 03/29/2018   Past Surgical History:  Procedure Laterality Date   ABDOMINAL AORTAGRAM N/A 03/14/2012   Procedure: ABDOMINAL EZELLA;  Surgeon: Gaile LELON New, MD;  Location: [facility] CATH LAB;  Service: Cardiovascular;  Laterality: N/A;   ABDOMINAL AORTIC ANEURYSM REPAIR  04-22-2009   Stent graft repair of AAA   APPENDECTOMY     BRONCHIAL BRUSHINGS Right 11/30/2017   Procedure: BRONCHIAL BRUSHINGS;  Surgeon: Vonzell Loving, MD;  Location: [facility] ENDO SUITE;  Service: Cardiopulmonary;  Laterality: Right;   BRONCHIAL WASHINGS Right 11/30/2017   Procedure: BRONCHIAL WASHINGS;  Surgeon: Vonzell Loving, MD;  Location: [facility] END[facility] SUITE;  Service: Cardiopulmonary;  Laterality: Right;   BYPASS GRAFT POPLITEAL TO POPLITEAL  03/20/2012   Procedure: BYPASS GRAFT POPLITEAL TO POPLITEAL;  Surgeon:  Carlin FORBES Haddock, MD;  Location: [facility] OR;  Service: Vascular;  Laterality: Left;  ABOVE THE KNEE POPLITEAL ARTERY TO BELOW THE KNEE POPLITEAL ARTERY BYPASS GRAFT USING REVERSE LEFT GREATER SAPHENOUS VEIN    CATARACT EXTRACTION W/PHACO Right 01/25/2022   Procedure: CATARACT EXTRACTION PHACO AND INTRAOCULAR LENS PLACEMENT (IOC);  Surgeon: Harrie Agent, MD;  Location: [facility] ORS;  Service: Ophthalmology;  Laterality: Right;  CDE: 7.80   CATARACT EXTRACTION W/PHACO Left 02/08/2022   Procedure: CATARACT EXTRACTION PHACO AND INTRAOCULAR LENS PLACEMENT (IOC);  Surgeon: Harrie Agent, MD;  Location: [facility] ORS;   Service: Ophthalmology;  Laterality: Left;  CDE 9.55   ENDARTERECTOMY POPLITEAL  03/20/2012   Procedure: ENDARTERECTOMY POPLITEAL;  Surgeon: Carlin FORBES Haddock, MD;  Location: [facility] OR;  Service: Vascular;  Laterality: Left;   LIGATION OF LEFT LEG POPLITEAL ANEURYSM   ESOPHAGOGASTRODUODENOSCOPY  09/28/2017   Dr. Oneil Rocks: Medium sized hiatal hernia, residual food.  Suspect gastroparesis.  Gastric atony.   ESOPHAGOGASTRODUODENOSCOPY  01/05/2018   [facility].  Large amount of solid food upon entry to the stomach.  Pylorus appeared patent.  Normal duodenal mucosa without evidence of obstruction to the second portion of duodenum.  Balloon dilation performed of the pylorus.   FLEXIBLE BRONCHOSCOPY N/A 11/30/2017   Procedure: FLEXIBLE BRONCHOSCOPY;  Surgeon: Vonzell Loving, MD;  Location: [facility];  Service: Cardiopulmonary;  Laterality: N/A;   INTRAOPERATIVE ARTERIOGRAM  03/20/2012   Procedure: INTRA OPERATIVE ARTERIOGRAM;  Surgeon: Carlin FORBES Haddock, MD;  Location: [facility] OR;  Service: Vascular;  Laterality: Left;  TIMES ONE   PORTACATH PLACEMENT Left 01/06/2018   Procedure: INSERTION PORT-A-CATH;  Surgeon: Mavis Oneil, MD;  Location: [facility] ORS;  Service: General;  Laterality: Left;   VASECTOMY  1982   VIDEO BRONCHOSCOPY WITH ENDOBRONCHIAL ULTRASOUND N/A 12/23/2017   Procedure: VIDEO BRONCHOSCOPY WITH ENDOBRONCHIAL ULTRASOUND;  Surgeon: Kerrin Elspeth BROCKS, MD;  Location: [facility] OR;  Service: Thoracic;  Laterality: N/A;    SOCIAL HISTORY:  Social History   Socioeconomic History   Marital status: Divorced    Spouse name: Not on file   Number of children: 2   Years of education: Not on file   Highest education level: Not on file  Occupational History   Occupation: [place]     Comment: [employer] exposure   Occupation: Secondary school teacher    Comment: [employer] exsposure  Tobacco Use   Smoking status: Every Day    Types: Cigars   Smokeless tobacco: Never  Vaping Use   Vaping status: Never  Used  Substance and Sexual Activity   Alcohol use: No   Drug use: No   Sexual activity: Not on file  Other Topics Concern   Not on file  Social History Narrative   Sedentary   Social Drivers of Health   Financial Resource Strain: Low Risk  (03/24/2020)   Overall Financial Resource Strain (CARDIA)    Difficulty of Paying Living Expenses: Not very hard  Food Insecurity: No Food Insecurity (03/24/2020)   Hunger Vital Sign    Worried About Running Out of Food in the Last Year: Never true    Ran Out of Food in the Last Year: Never true  Transportation Needs: No Transportation Needs (03/24/2020)   PRAPARE - Administrator, Civil Service (Medical): No    Lack of Transportation (Non-Medical): No  Physical Activity: Inactive (03/24/2020)   Exercise Vital Sign    Days of Exercise per Week: 0 days    Minutes of Exercise per  Session: 0 min  Stress: No Stress Concern Present (03/24/2020)   [employer] of Occupational Health - Occupational Stress Questionnaire    Feeling of Stress : Not at all  Social Connections: Moderately Isolated (03/24/2020)   Social Connection and Isolation Panel    Frequency of Communication with Friends and Family: Three times a week    Frequency of Social Gatherings with Friends and Family: Twice a week    Attends Religious Services: 1 to 4 times per year    Active Member of [employer] or Organizations: No    Attends Banker Meetings: Never    Marital Status: Divorced  Catering manager Violence: Not At Risk (03/24/2020)   Humiliation, Afraid, Rape, and Kick questionnaire    Fear of Current or Ex-Partner: No    Emotionally Abused: No    Physically Abused: No    Sexually Abused: No    FAMILY HISTORY:  Family History  Problem Relation Age of Onset   Diabetes Mother    Stroke Mother    Heart disease Father    Diabetes Father    Heart disease Sister        CABG x 4   Cancer Brother        throat cancer   Diabetes Sister    Heart  disease Sister    Cancer Brother        pancreatic   Prostate cancer Brother    Kidney disease Son    Colon cancer Neg Hx     CURRENT MEDICATIONS:  Current Outpatient Medications  Medication Sig Dispense Refill   Ipratropium-Albuterol  (COMBIVENT) 20-100 MCG/ACT AERS respimat Inhale 1 puff into the lungs every 6 (six) hours as needed for wheezing.     levothyroxine  (SYNTHROID ) 137 MCG tablet Take 137 mcg by mouth every other day. Alternates with 150 mcg     LORazepam  (ATIVAN ) 1 MG tablet Take 1 tablet (1 mg total) by mouth 2 (two) times daily. 60 tablet 3   predniSONE  (DELTASONE ) 10 MG tablet Take 10 mg by mouth.     sertraline  (ZOLOFT ) 50 MG tablet Take 50 mg by mouth daily.     No current facility-administered medications for this visit.    ALLERGIES:  Allergies  Allergen Reactions   Morphine And Codeine Shortness Of Breath and Other (See Comments)    Hallucinations pt has taken hydromorphone  before    LABORATORY DATA:  I have reviewed the labs as listed.     Latest Ref Rng & Units 12/12/2023    2:52 PM 06/08/2023   12:52 PM 12/07/2022    9:39 AM  CBC  WBC 4.0 - 10.5 K/uL 7.1  7.8  6.7   Hemoglobin 13.0 - 17.0 g/dL 85.7  85.3  85.8   Hematocrit 39.0 - 52.0 % 43.4  45.8  46.4   Platelets 150 - 400 K/uL 167  173  144       Latest Ref Rng & Units 12/12/2023    2:52 PM 06/08/2023   12:52 PM 12/07/2022    9:39 AM  CMP  Glucose 70 - 99 mg/dL 881  72  856   BUN 8 - 23 mg/dL 19  24  20    Creatinine 0.61 - 1.24 mg/dL 8.89  9.11  9.00   Sodium 135 - 145 mmol/L 140  139  140   Potassium 3.5 - 5.1 mmol/L 4.6  4.5  4.2   Chloride 98 - 111 mmol/L 104  102  105   CO2 22 -  32 mmol/L 28  28  26    Calcium 8.9 - 10.3 mg/dL 8.8  8.7  8.4   Total Protein 6.5 - 8.1 g/dL 7.2  7.2  7.4   Total Bilirubin 0.0 - 1.2 mg/dL 0.5  0.6  0.5   Alkaline Phos 38 - 126 U/L 73  63  61   AST 15 - 41 U/L 16  15  14    ALT 0 - 44 U/L 8  10  11      DIAGNOSTIC IMAGING:  I have independently reviewed  the scans and discussed with the patient. CT CHEST W CONTRAST Result Date: 12/12/2023 CLINICAL DATA:  Small cell lung cancer.  * Tracking Code: BO * EXAM: CT CHEST WITH CONTRAST TECHNIQUE: Multidetector CT imaging of the chest was performed during intravenous contrast administration. RADIATION DOSE REDUCTION: This exam was performed according to the departmental dose-optimization program which includes automated exposure control, adjustment of the mA and/or kV according to patient size and/or use of iterative reconstruction technique. CONTRAST:  75mL OMNIPAQUE  IOHEXOL  300 MG/ML  SOLN COMPARISON:  06/08/2023 and 04/01/2022. FINDINGS: Cardiovascular: Atherosclerotic calcification of the aorta and coronary arteries. Heart size normal. Small amount of pericardial fluid is unchanged. Mediastinum/Nodes: No pathologically enlarged mediastinal, hilar or axillary lymph nodes. Air in the esophagus can be seen with dysmotility. Lungs/Pleura: Centrilobular and paraseptal emphysema. Slightly spiculated appearing nodule in the posterior segment right upper lobe measures 7 mm (3/45), stable from 06/08/2023 but new from 04/01/2022. Post radiation volume loss in the medial right hemithorax. No pleural fluid. Airway is unremarkable. Upper Abdomen: Small right adrenal adenoma. No specific follow-up necessary. Elevated left hemidiaphragm. Visualized portions of the liver, adrenal glands, kidneys, spleen, pancreas, stomach and bowel are otherwise grossly unremarkable. No upper abdominal adenopathy. Partially imaged infrarenal aortic repair. Musculoskeletal: Degenerative changes in the spine. Osteopenia. Kyphosis. IMPRESSION: 1. Somewhat spiculated appearing 7 mm right upper lobe nodule is unchanged from 06/08/2023 but new from 04/01/2022. Bronchogenic carcinoma cannot be excluded. Recommend continued attention on follow-up as nodule is too small for PET resolution. 2. Resolved subpleural consolidation in inferior posterolateral left  lower lobe. 3. Small right adrenal adenoma. 4. Aortic atherosclerosis (ICD10-I70.0). Coronary artery calcification. 5.  Emphysema (ICD10-J43.9). Electronically Signed   By: Newell Eke M.D.   On: 12/12/2023 16:30     WRAP UP:  All questions were answered. The patient knows to call the clinic with any problems, questions or concerns.  Medical decision making: ***  Time spent on visit: I spent {CHL ONC TIME VISIT - DTPQU:[id number]} counseling the patient face to face. The total time spent in the appointment was {CHL ONC TIME VISIT - DTPQU:[id number]} and more than 50% was on counseling.  Pleasant CHRISTELLA Barefoot, PA-C  ***

## 2023-12-19 ENCOUNTER — Inpatient Hospital Stay (HOSPITAL_BASED_OUTPATIENT_CLINIC_OR_DEPARTMENT_OTHER): Admitting: Physician Assistant

## 2023-12-19 VITALS — BP 121/64 | HR 79 | Temp 97.7°F | Resp 18 | Ht 69.0 in | Wt 138.0 lb

## 2023-12-19 DIAGNOSIS — C3490 Malignant neoplasm of unspecified part of unspecified bronchus or lung: Secondary | ICD-10-CM | POA: Diagnosis not present

## 2023-12-19 NOTE — Patient Instructions (Signed)
 [facility] **VISIT SUMMARY & IMPORTANT INSTRUCTIONS **   You were seen today by Pleasant Barefoot PA-C for your follow-up visit.    HISTORY OF SMALL CANCER Your most recent CT scan (12/12/2023) continues to show a nodule in your right lung.  This was new in April 2025, but has been stable since that time.  This may or may not represent a small slow-growing lung cancer, but is too small to check PET scan at this time. We will check another CT scan in 6 months, followed by MD visit with DR. KANDALA.  SMOKING CESSATION The best thing you can do to help prevent lung cancer is to quit smoking. Please see the attached handout for important information on smoking cessation as well as referral to smoking cessation counselor.  FOLLOW-UP APPOINTMENT: 6 months  ** Thank you for trusting me with your healthcare!  I strive to provide all of my patients with quality care at each visit.  If you receive a survey for this visit, I would be so grateful to you for taking the time to provide feedback.  Thank you in advance!  ~ [name] Motta                                        Dr. Mickiel Davonna Pleasant Barefoot, PA-C          Delon Hope, NP   - - - - - - - - - - - - - - - - - -    Thank you for choosing [facility] at [facility] to provide your oncology and hematology care.  To afford each patient quality time with our provider, please arrive at least 15 minutes before your scheduled appointment time.   If you have a lab appointment with the Cancer Center please come in thru the Main Entrance and check in at the main information desk.  You need to re-schedule your appointment should you arrive 10 or more minutes late.  We strive to give you quality time with our providers, and arriving late affects you and other patients whose appointments are after yours.  Also, if you no show three or more times for appointments you may be dismissed  from the clinic at the providers discretion.     Again, thank you for choosing [facility].  Our hope is that these requests will decrease the amount of time that you wait before being seen by our physicians.       _____________________________________________________________  Should you have questions after your visit to [facility], please contact our office at [phone] and follow the prompts.  Our office hours are 8:00 a.m. and 4:30 p.m. Monday - Friday.  Please note that voicemails left after 4:00 p.m. may not be returned until the following business day.  We are closed weekends and major holidays.  You do have access to a nurse 24-7, just call the main number to the clinic [phone] and do not press any options, hold on the line and a nurse will answer the phone.    For prescription refill requests, have your pharmacy contact our office and allow 72 hours.

## 2023-12-22 DIAGNOSIS — H353132 Nonexudative age-related macular degeneration, bilateral, intermediate dry stage: Secondary | ICD-10-CM | POA: Diagnosis not present

## 2023-12-22 DIAGNOSIS — H01001 Unspecified blepharitis right upper eyelid: Secondary | ICD-10-CM | POA: Diagnosis not present

## 2023-12-22 DIAGNOSIS — Z961 Presence of intraocular lens: Secondary | ICD-10-CM | POA: Diagnosis not present

## 2023-12-22 DIAGNOSIS — H02042 Spastic entropion of right lower eyelid: Secondary | ICD-10-CM | POA: Diagnosis not present

## 2024-01-23 NOTE — Progress Notes (Signed)
 Terry Boyd                                          MRN: [id number]   01/23/2024   The [facility]BCI Quality Team Specialist reviewed this patient medical record for the purposes of chart review for care gap closure. The following were reviewed: chart review for care gap closure-kidney health evaluation for diabetes:eGFR  and uACR.    VBCI Quality Team

## 2024-01-31 ENCOUNTER — Other Ambulatory Visit: Payer: Self-pay

## 2024-01-31 DIAGNOSIS — C3490 Malignant neoplasm of unspecified part of unspecified bronchus or lung: Secondary | ICD-10-CM

## 2024-02-15 ENCOUNTER — Other Ambulatory Visit: Payer: Self-pay | Admitting: Oncology

## 2024-02-15 DIAGNOSIS — C3490 Malignant neoplasm of unspecified part of unspecified bronchus or lung: Secondary | ICD-10-CM

## 2024-03-12 ENCOUNTER — Other Ambulatory Visit: Payer: Self-pay | Admitting: *Deleted

## 2024-03-12 DIAGNOSIS — C3490 Malignant neoplasm of unspecified part of unspecified bronchus or lung: Secondary | ICD-10-CM

## 2024-03-12 MED ORDER — LORAZEPAM 1 MG PO TABS: 1.0000 mg | ORAL_TABLET | Freq: Two times a day (BID) | ORAL | 0 refills | Status: AC

## 2024-06-12 ENCOUNTER — Inpatient Hospital Stay

## 2024-06-12 ENCOUNTER — Ambulatory Visit (HOSPITAL_COMMUNITY)

## 2024-06-19 ENCOUNTER — Inpatient Hospital Stay: Admitting: Oncology

## 2024-06-19 ENCOUNTER — Inpatient Hospital Stay: Admitting: Physician Assistant
